# Patient Record
Sex: Male | Born: 1980 | Race: White | Hispanic: No | Marital: Single | Smoking: Never smoker
Health system: Southern US, Community
[De-identification: ages and names within clinical notes are randomized; demographics above are authoritative.]

## PROBLEM LIST (undated history)

## (undated) DIAGNOSIS — F32A Depression, unspecified: Secondary | ICD-10-CM

## (undated) DIAGNOSIS — B20 Human immunodeficiency virus [HIV] disease: Secondary | ICD-10-CM

## (undated) DIAGNOSIS — F419 Anxiety disorder, unspecified: Secondary | ICD-10-CM

## (undated) DIAGNOSIS — Z973 Presence of spectacles and contact lenses: Secondary | ICD-10-CM

## (undated) DIAGNOSIS — R85619 Unspecified abnormal cytological findings in specimens from anus: Secondary | ICD-10-CM

## (undated) DIAGNOSIS — E785 Hyperlipidemia, unspecified: Secondary | ICD-10-CM

## (undated) DIAGNOSIS — I639 Cerebral infarction, unspecified: Secondary | ICD-10-CM

## (undated) DIAGNOSIS — G4700 Insomnia, unspecified: Secondary | ICD-10-CM

## (undated) DIAGNOSIS — R011 Cardiac murmur, unspecified: Secondary | ICD-10-CM

## (undated) DIAGNOSIS — F3181 Bipolar II disorder: Secondary | ICD-10-CM

## (undated) HISTORY — DX: Depression, unspecified: F32.A

## (undated) HISTORY — DX: Cerebral infarction, unspecified: I63.9

## (undated) HISTORY — DX: Hyperlipidemia, unspecified: E78.5

## (undated) HISTORY — DX: Anxiety disorder, unspecified: F41.9

---

## 2009-02-15 DIAGNOSIS — R7989 Other specified abnormal findings of blood chemistry: Secondary | ICD-10-CM

## 2009-02-15 DIAGNOSIS — R760 Raised antibody titer: Secondary | ICD-10-CM

## 2009-02-15 HISTORY — DX: Raised antibody titer: R76.0

## 2009-02-15 HISTORY — DX: Other specified abnormal findings of blood chemistry: R79.89

## 2014-02-15 DIAGNOSIS — Z21 Asymptomatic human immunodeficiency virus [HIV] infection status: Secondary | ICD-10-CM

## 2014-02-15 HISTORY — DX: Asymptomatic human immunodeficiency virus (hiv) infection status: Z21

## 2014-04-02 DIAGNOSIS — B20 Human immunodeficiency virus [HIV] disease: Secondary | ICD-10-CM | POA: Insufficient documentation

## 2018-03-19 ENCOUNTER — Other Ambulatory Visit: Payer: Self-pay

## 2018-03-19 ENCOUNTER — Emergency Department
Admission: EM | Admit: 2018-03-19 | Discharge: 2018-03-19 | Disposition: A | Discharge status: Home / Self Care | Payer: Self-pay | Attending: Emergency Medicine | Admitting: Emergency Medicine

## 2018-03-19 ENCOUNTER — Emergency Department: Payer: Self-pay

## 2018-03-19 DIAGNOSIS — R1031 Right lower quadrant pain: Secondary | ICD-10-CM | POA: Insufficient documentation

## 2018-03-19 DIAGNOSIS — R109 Unspecified abdominal pain: Secondary | ICD-10-CM

## 2018-03-19 HISTORY — DX: Human immunodeficiency virus (HIV) disease: B20

## 2018-03-19 HISTORY — DX: Cardiac murmur, unspecified: R01.1

## 2018-03-19 LAB — COMPREHENSIVE METABOLIC PANEL
ALBUMIN: 4.2 g/dL (ref 3.5–5.0)
ALT: 16 U/L (ref 0–44)
AST: 22 U/L (ref 15–41)
Alkaline Phosphatase: 68 U/L (ref 38–126)
Anion gap: 6 (ref 5–15)
BUN: 14 mg/dL (ref 6–20)
CO2: 32 mmol/L (ref 22–32)
CREATININE: 0.99 mg/dL (ref 0.61–1.24)
Calcium: 9.5 mg/dL (ref 8.9–10.3)
Chloride: 102 mmol/L (ref 98–111)
GFR calc Af Amer: >60 mL/min (ref >60)
GFR calc non Af Amer: >60 mL/min (ref >60)
GLUCOSE: 103 mg/dL — AB (ref 70–99)
Potassium: 3.9 mmol/L (ref 3.5–5.1)
Sodium: 140 mmol/L (ref 135–145)
Total Bilirubin: 0.5 mg/dL (ref 0.3–1.2)
Total Protein: 8.2 g/dL — ABNORMAL HIGH (ref 6.5–8.1)

## 2018-03-19 LAB — URINALYSIS, COMPLETE (UACMP) WITH MICROSCOPIC
Bacteria, UA: NONE SEEN
Bilirubin Urine: NEGATIVE
Glucose, UA: NEGATIVE mg/dL
Hgb urine dipstick: NEGATIVE
Ketones, ur: NEGATIVE mg/dL
Leukocytes, UA: NEGATIVE
Nitrite: NEGATIVE
PH: 7 (ref 5.0–8.0)
Protein, ur: NEGATIVE mg/dL
Specific Gravity, Urine: 1.011 (ref 1.005–1.030)
Squamous Epithelial / HPF: NONE SEEN (ref 0–5)

## 2018-03-19 LAB — CBC
HCT: 44.3 % (ref 39.0–52.0)
Hemoglobin: 14.5 g/dL (ref 13.0–17.0)
MCH: 27.5 pg (ref 26.0–34.0)
MCHC: 32.7 g/dL (ref 30.0–36.0)
MCV: 84.1 fL (ref 80.0–100.0)
Platelets: 268 10*3/uL (ref 150–400)
RBC: 5.27 MIL/uL (ref 4.22–5.81)
RDW: 12.4 % (ref 11.5–15.5)
WBC: 7.2 10*3/uL (ref 4.0–10.5)
nRBC: 0 % (ref 0.0–0.2)

## 2018-03-19 LAB — LIPASE, BLOOD: Lipase: 32 U/L (ref 11–51)

## 2018-03-19 IMAGING — CT CT RENAL STONE PROTOCOL
3 of 4 series · 8 of 46 positions shown, 15 images · non-contrast
Comparison: None.

CLINICAL DATA: Right lower quadrant pain, nausea

EXAM:
CT ABDOMEN AND PELVIS WITHOUT CONTRAST
TECHNIQUE: Multidetector CT imaging of the abdomen and pelvis was performed
following the standard protocol without IV contrast.

[Series 4: lung bases · axial · 0.59mm/px · z∈[-35,+25]mm · 4 of 22 slices shown, 9 images]
[im 5/22  soft-tissue]
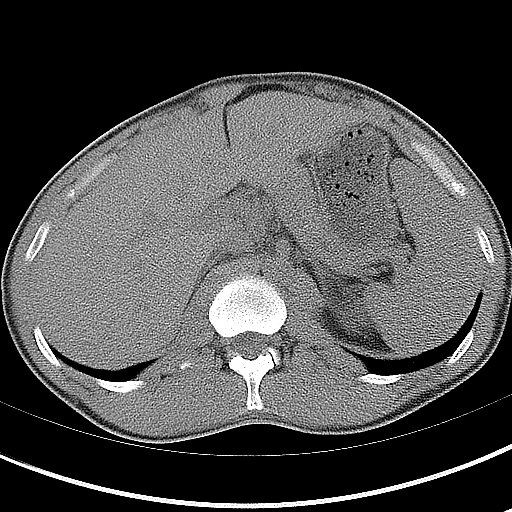
[im 5/22  lung]
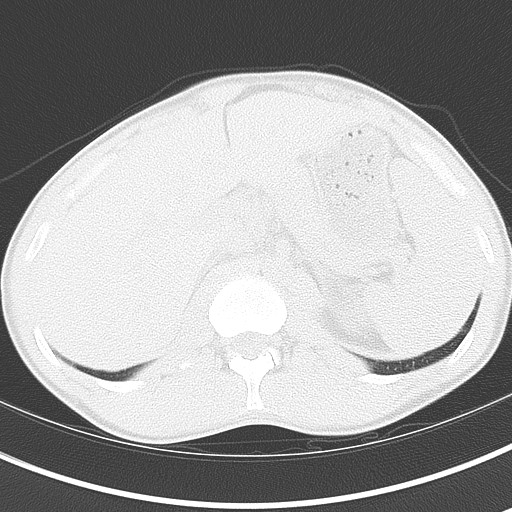
[im 5/22  bone]
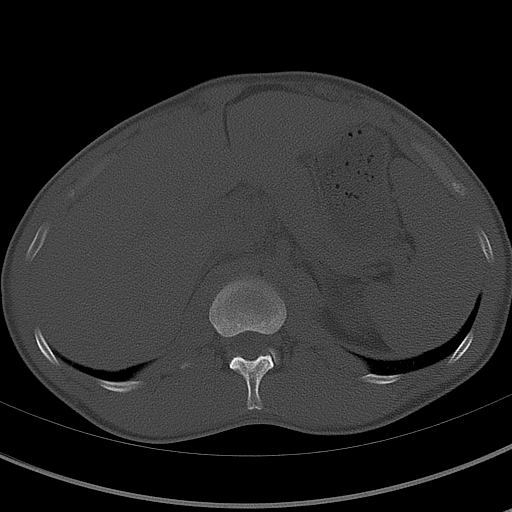
[im 9/22  soft-tissue]
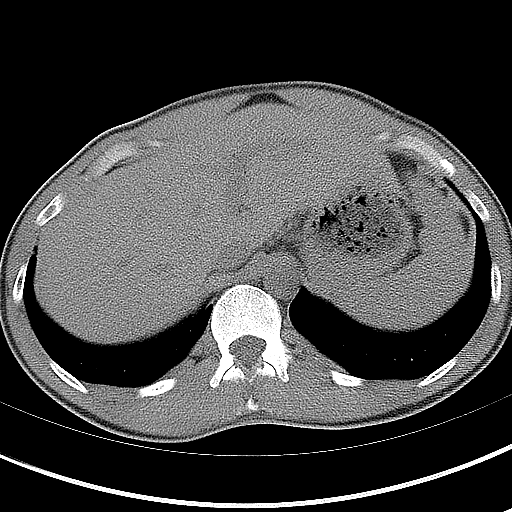
[im 9/22  lung]
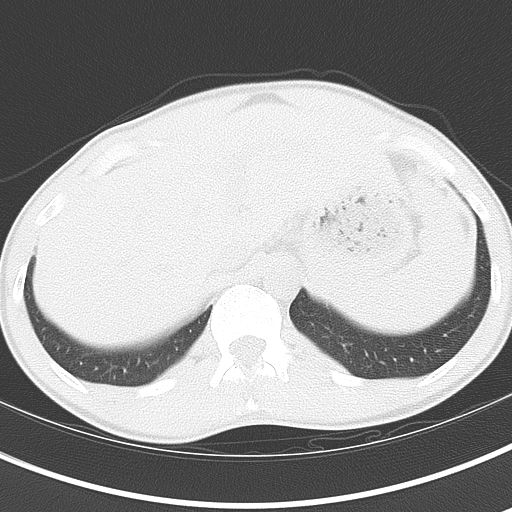
[im 13/22  soft-tissue]
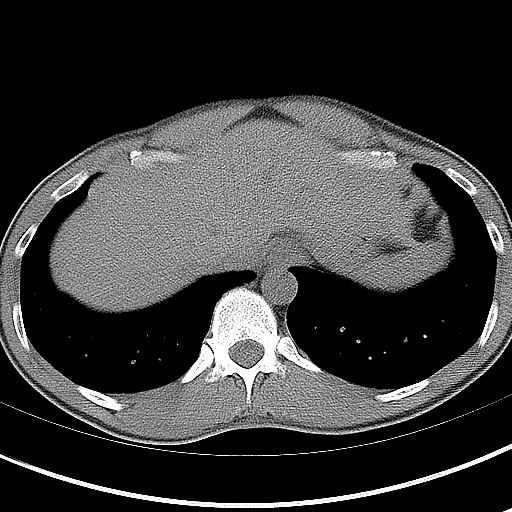
[im 13/22  lung]
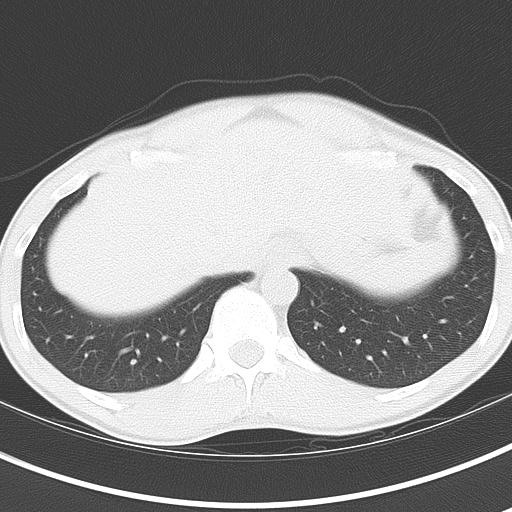
[im 17/22  soft-tissue]
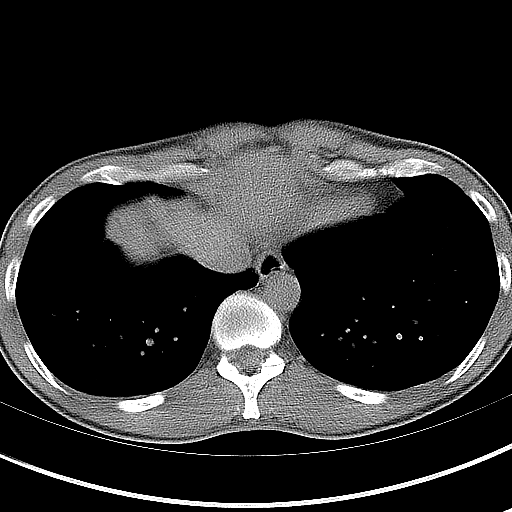
[im 17/22  lung]
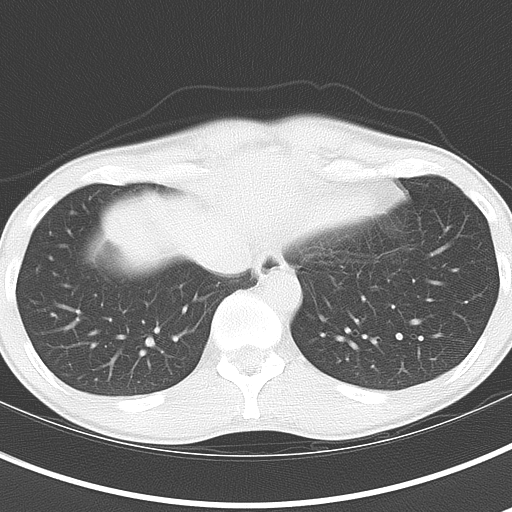

[Series 5: coronal · coronal · 0.70mm/px · 3 of 115 slices shown, 4 images]
[im 39/115  soft-tissue]
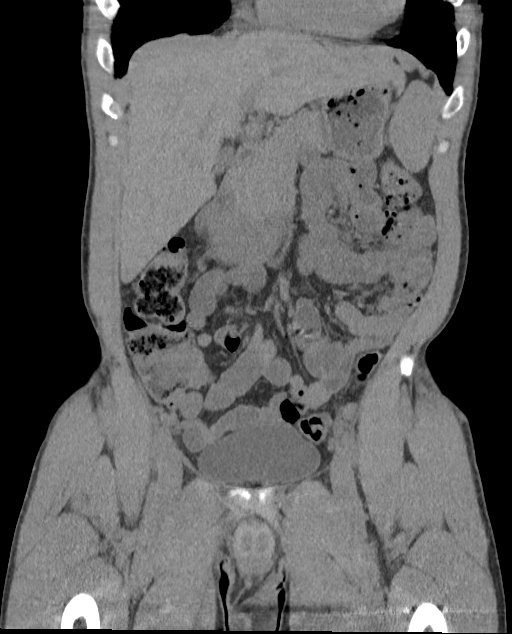
[im 51/115  soft-tissue]
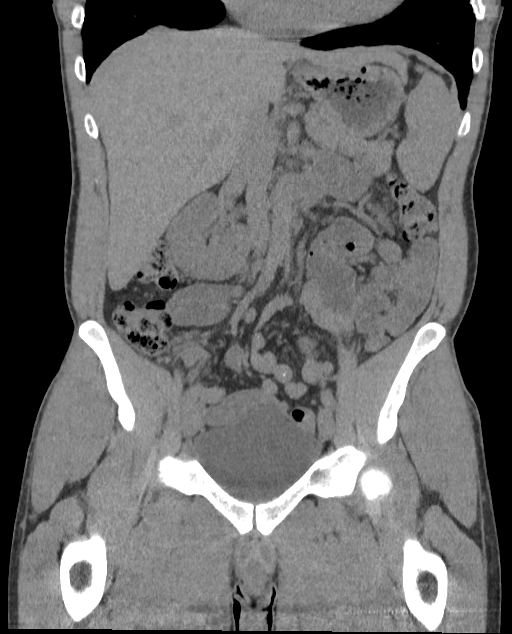
[im 51/115  bone]
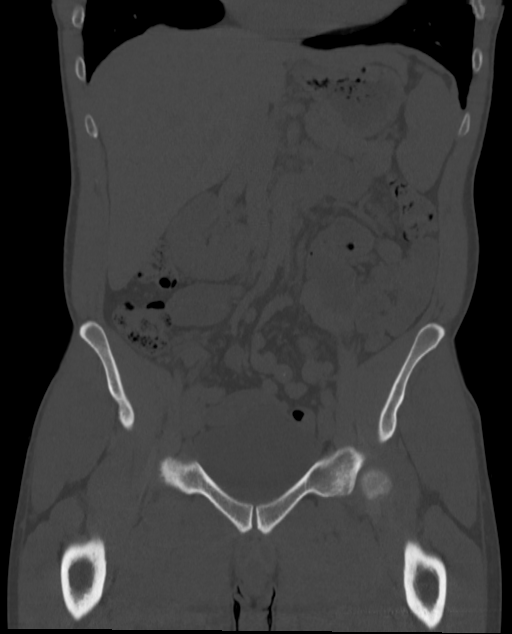
[im 64/115  soft-tissue]
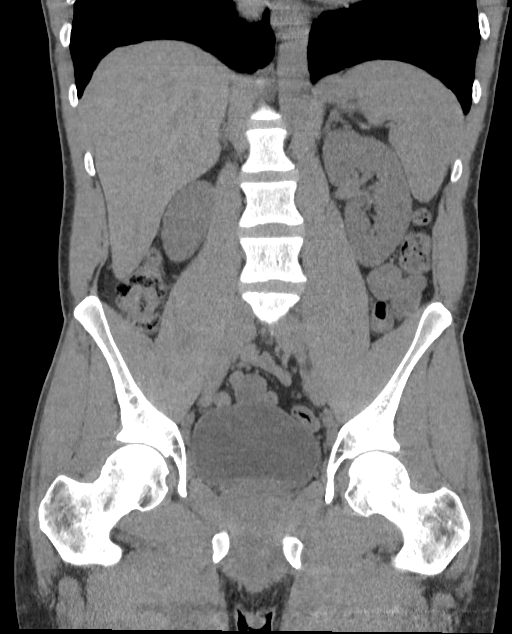

[Series 6: sagittal · sagittal · 0.49mm/px · 1 of 165 slices shown, 2 images]
[im 55/165  soft-tissue]
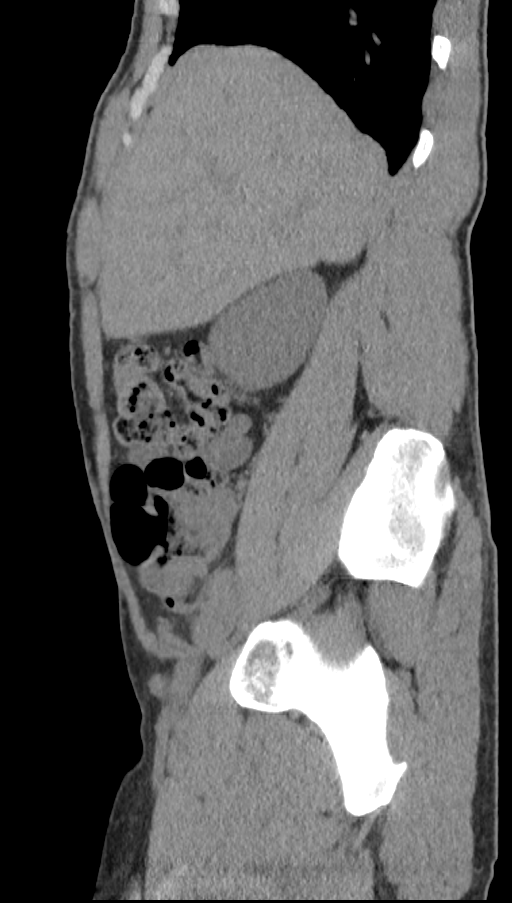
[im 55/165  bone]
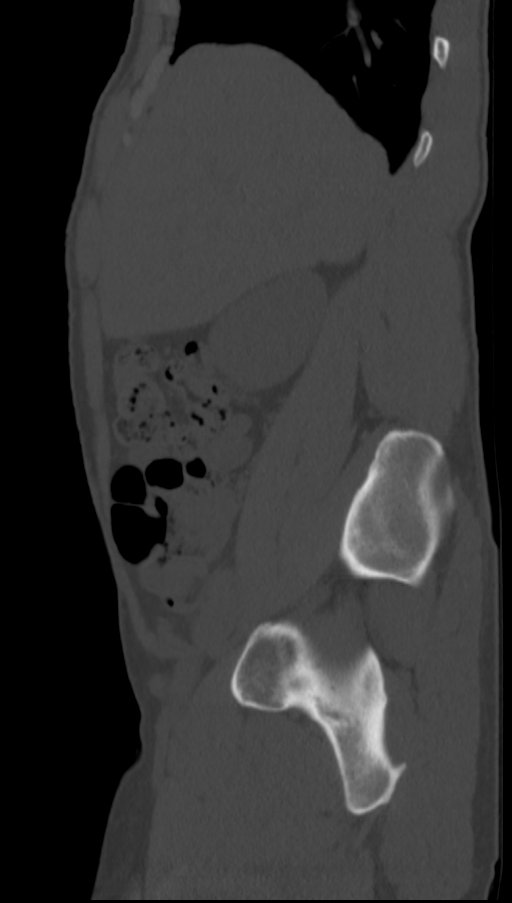

[8 of 46 positions shown; findings below may reference images not displayed]

FINDINGS: Lower chest: Lung bases are clear. No effusions. Heart is normal
size.

Hepatobiliary: Insert fat of biliary

Pancreas: No focal abnormality or ductal dilatation.

Spleen: No focal abnormality.  Normal size.

Adrenals/Urinary Tract: Punctate nonobstructing 1 mm stone in the
lower pole of the right kidney. No hydronephrosis. Adrenal glands
and urinary bladder unremarkable.

Stomach/Bowel: Normal appendix. Moderate stool burden throughout the
colon. Stomach, large and small bowel grossly unremarkable.

Vascular/Lymphatic: No evidence of aneurysm or adenopathy.

Reproductive: No visible focal abnormality.

Other: No free fluid or free air.

Musculoskeletal: No acute bony abnormality.
IMPRESSION: Punctate nonobstructing 1 mm right lower pole stone. No ureteral
stones or hydronephrosis.

Moderate stool burden.

No acute findings.

## 2018-03-19 NOTE — ED Notes (Signed)
ED Provider at bedside. 

## 2018-03-19 NOTE — ED Notes (Signed)
Patient transported to CT 

## 2018-03-19 NOTE — ED Provider Notes (Signed)
[facility] Emergency Department Provider Note  ___________________________________________   First MD Initiated Contact with Patient 03/19/18 1859     (approximate)  I have reviewed the triage vital signs and the nursing notes.   HISTORY  Chief Complaint Abdominal Pain   HPI Lucia Bitterhomas Ange is a 37 y.o. male with a history of HIV not on antiretroviral therapy over the past year was presented to the emergency department with right flank and right abdominal pain.  He says that the pain is been there for the last 2 days and is a 4 out of 10 pressure-like pain.  The patient denies any heavy lifting or injury or exertional activity that would have caused this.  Denies any nausea vomiting or diarrhea.  Denies any history of kidney stones.  No pain with urination.  No blood in his urine.  Pain does not worsen with movement.  Patient denies any radiation of the pain to the penis of the testicles.   Past Medical History:  Diagnosis Date  . Heart murmur   . HIV (human immunodeficiency virus infection) (HCC)     There are no active problems to display for this patient.   History reviewed. No pertinent surgical history.  Prior to Admission medications   Not on File    Allergies Patient has no known allergies.  No family history on file.  Social History Social History   Tobacco Use  . Smoking status: Never Smoker  Substance Use Topics  . Alcohol use: Not Currently  . Drug use: Not on file    Review of Systems  Constitutional: No fever/chills Eyes: No visual changes. ENT: No sore throat. Cardiovascular: Denies chest pain. Respiratory: Denies shortness of breath. Gastrointestinal: No nausea, no vomiting.  No diarrhea.  No constipation. Genitourinary: Negative for dysuria. Musculoskeletal: Negative for back pain. Skin: Negative for rash. Neurological: Negative for headaches, focal weakness or  numbness.   ____________________________________________   PHYSICAL EXAM:  VITAL SIGNS: ED Triage Vitals [03/19/18 1705]  Enc Vitals Group     BP 132/80     Pulse Rate 80     Resp 18     Temp 98.1 F (36.7 C)     Temp Source Oral     SpO2 99 %     Weight 145 lb (65.8 kg)     Height 5\' 8"  (1.727 m)     Head Circumference      Peak Flow      Pain Score 4     Pain Loc      Pain Edu?      Excl. in GC?     Constitutional: Alert and oriented. Well appearing and in no acute distress. Eyes: Conjunctivae are normal.  Head: Atraumatic. Nose: No congestion/rhinnorhea. Mouth/Throat: Mucous membranes are moist.  Neck: No stridor.   Cardiovascular: Normal rate, regular rhythm. Grossly normal heart sounds.  Respiratory: Normal respiratory effort.  No retractions. Lungs CTAB. Gastrointestinal: Soft with mild right flank tenderness to palpation.  No right lower quadrant tenderness.  Negative Murphy sign.  No distention. No CVA tenderness. Musculoskeletal: No lower extremity tenderness nor edema.  No joint effusions. Neurologic:  Normal speech and language. No gross focal neurologic deficits are appreciated. Skin:  Skin is warm, dry and intact. No rash noted. Psychiatric: Mood and affect are normal. Speech and behavior are normal.  ____________________________________________   LABS (all labs ordered are listed, but only abnormal results are displayed)  Labs Reviewed  COMPREHENSIVE METABOLIC PANEL - Abnormal;  Notable for the following components:      Result Value   Glucose, Bld 103 (*)    Total Protein 8.2 (*)    All other components within normal limits  URINALYSIS, COMPLETE (UACMP) WITH MICROSCOPIC - Abnormal; Notable for the following components:   Color, Urine STRAW (*)    APPearance CLEAR (*)    All other components within normal limits  LIPASE, BLOOD  CBC    ____________________________________________  EKG   ____________________________________________  RADIOLOGY  Punctate nonobstructing 1 mm right pleural pole stone.  No ureteral stone or hydronephrosis.  Moderate stool burden.  No other acute findings.  Normal appendix visualized. ____________________________________________   PROCEDURES  Procedure(s) performed:   Procedures  Critical Care performed:   ____________________________________________   INITIAL IMPRESSION / ASSESSMENT AND PLAN / ED COURSE  Pertinent labs & imaging results that were available during my care of the patient were reviewed by me and considered in my medical decision making (see chart for details).  Differential diagnosis includes, but is not limited to, acute appendicitis, renal colic, testicular torsion, urinary tract infection/pyelonephritis, prostatitis,  epididymitis, diverticulitis, small bowel obstruction or ileus, colitis, abdominal aortic aneurysm, gastroenteritis, hernia, etc. As part of my medical decision making, I reviewed the following data within the electronic MEDICAL RECORD NUMBER Notes from prior ED visits  ----------------------------------------- 8:05 PM on 03/19/2018 -----------------------------------------  Possibly pain related to small lower pole stone starting to make its way down the ureter.  However, most likely unrelated.  Patient also states that he is not having any constipation and is moving his bowels per his baseline.  To give follow-up with primary care as well as infectious disease so the patient may be represcribed antiretroviral therapy.  He is understanding the diagnosis as well as treatment and willing to comply. ____________________________________________   FINAL CLINICAL IMPRESSION(S) / ED DIAGNOSES  Right flank pain  NEW MEDICATIONS STARTED DURING THIS VISIT:  New Prescriptions   No medications on file     Note:  This document was prepared using Dragon  voice recognition software and may include unintentional dictation errors.     Myrna Blazer, MD 03/19/18 2005

## 2018-03-19 NOTE — ED Triage Notes (Signed)
RLQ pain over past 2 days, progressing. Nausea. Denies emesis or diarrhea. Pt alert and oriented X4, active, cooperative, pt in NAD. RR even and unlabored, color WNL.

## 2018-03-25 ENCOUNTER — Telehealth: Payer: Self-pay | Admitting: Licensed Clinical Social Worker

## 2018-03-25 NOTE — Telephone Encounter (Signed)
Referral was sent from [facility] ED for patient to be seen in ID and started on antiretrovirals. Left message for the patient to contact me on voicemail.

## 2018-05-07 ENCOUNTER — Other Ambulatory Visit
Admission: RE | Admit: 2018-05-07 | Discharge: 2018-05-07 | Disposition: A | Discharge status: Home / Self Care | Payer: PRIVATE HEALTH INSURANCE | Source: Ambulatory Visit | Attending: Infectious Diseases | Admitting: Infectious Diseases

## 2018-05-07 ENCOUNTER — Encounter: Payer: Self-pay | Admitting: Infectious Diseases

## 2018-05-07 ENCOUNTER — Ambulatory Visit: Payer: PRIVATE HEALTH INSURANCE | Attending: Infectious Diseases | Admitting: Infectious Diseases

## 2018-05-07 VITALS — BP 123/71 | HR 80 | Temp 97.4°F

## 2018-05-07 DIAGNOSIS — B20 Human immunodeficiency virus [HIV] disease: Secondary | ICD-10-CM | POA: Insufficient documentation

## 2018-05-07 DIAGNOSIS — Z21 Asymptomatic human immunodeficiency virus [HIV] infection status: Secondary | ICD-10-CM

## 2018-05-07 LAB — CHLAMYDIA/NGC RT PCR (ARMC ONLY)
Chlamydia Tr: NOT DETECTED
N gonorrhoeae: NOT DETECTED

## 2018-05-07 MED ORDER — BICTEGRAVIR-EMTRICITAB-TENOFOV 50-200-25 MG PO TABS: 1.0000 | ORAL_TABLET | Freq: Every day | ORAL | 1 refills | Status: DC

## 2018-05-07 NOTE — Progress Notes (Signed)
NAME: Darrell Kennedy  DOB: 12/05/1980  MRN: [id number]  Date/Time: 05/07/2018 12:06 PM Subjective:  REASON FOR CONSULT: here to engage in care ? Darrell Kennedy is a 38 y.o.male  with a history HIV diagnosed NoV 2015 in [place] when he had flu like symptoms and knew he had acute HIV iand went and got tested. Nadir Cd4 was > 700  He saw Dr.PAul Katrinka Blazing and after labs done he was started on isentress and truvada. He later changed to [facility]. Since end of 2017 he has been off treatment because of lack of insurance. He moved to [facility] in 2018 and did not engage in care because of lack on insurance. He went to the Ed with abdominal pain on 12/3 and was told it could be due to renal stone. He was asked to follow up with ID for HIV care. Pt has insurance now Insurance claims handler [facility])  Doing well No fver , chills, weight loss, cough, sob, diarrhea, fatigue Not sexually active in nearly 2 years   He teaches dance at  [employer] Nadir Cd4 744 VL >200,000 OI none HAARt history truvada isentress Genvoya Acquired thru-sex Genotype ? Past Medical History:  Diagnosis Date  . Heart murmur   . HIV (human immunodeficiency virus infection) (HCC)    ear infection  FH Father- diabetes, HTN Mother-adrenal tumor removed ?SH Non smoker No illicit drug use Occasional alcohol Has traveled to [place] in 2013 Lives with his parents Teaches at [employer]  REVIEW OF SYSTEMS:  Const: negative fever, negative chills, negative weight loss Eyes: negative diplopia or visual changes, negative eye pain ENT: negative coryza, negative sore throat Resp: negative cough, hemoptysis, dyspnea Cards: negative for chest pain, palpitations, lower extremity edema GU: negative for frequency, dysuria and hematuria Skin: negative for rash and pruritus Heme: negative for easy bruising and gum/nose bleeding MS: negative for myalgias, arthralgias, back pain and muscle weakness Neurolo:negative for headaches, dizziness, vertigo, memory problems   Psych: negative for feelings of anxiety, depression   Objective:  VITALS:  BP 123/71 (BP Location: Left Arm, Patient Position: Sitting, Cuff Size: Normal)   Pulse 80   Temp (!) 97.4 F (36.3 C) (Oral)  PHYSICAL EXAM:  General: Alert, cooperative, no distress, appears stated age.  Head: Normocephalic, without obvious abnormality, atraumatic. Eyes: Conjunctivae clear, anicteric sclerae. Pupils are equal Nose: Nares normal. No drainage or sinus tenderness. Throat: Lips, mucosa, and tongue normal. No Thrush Neck: Supple, symmetrical, no adenopathy, thyroid: non tender no carotid bruit and no JVD. Back: No CVA tenderness. Lungs: Clear to auscultation bilaterally. No Wheezing or Rhonchi. No rales. Heart: Regular rate and rhythm, no murmur, rub or gallop. Abdomen: Soft, non-tender,not distended. Bowel sounds normal. No masses Extremities: Extremities normal, atraumatic, no cyanosis. No edema. No clubbing Skin: No rashes or lesions. Not Jaundiced Lymph: Cervical, supraclavicular normal. Neurologic: Grossly non-focal Pertinent Labs None currently Health maintenance  Vaccination pneumovac- 23- Prevnar-13 on 04/02/14 HepB/HEPA twinrix-05/15/14, 06/15/14 & 11/24/14 HepA TdaP-05/15/14 Flu Herpes zoster- HPV ______________________ labs RPR HEPC ab Lipid CMv TOXO -IGG/IgM HIV VL Cd4 quantiferon Gold GC/CHL [id number] on 04/02/14 Genotype-A98G resistance to viramune possible on 04/02/14 HIV antibody  Preventive  Dental Colonoscopy Opthal Anal PAP  Impression/Recommendation ? ?HIV- here to engage in care has been off meds for 2 years now- will do labs today- will start Biktarvy Side effects explained   Health maintenance to be updated ? Follow up 3 months When pt was in the visit his medical records from his previous provider was not  available- it was faxed to [place] after he left.

## 2018-05-07 NOTE — Patient Instructions (Signed)
You are here to engage in HIV care. Today we will do labs and start Biktarvy.

## 2018-05-08 LAB — HEPATITIS PANEL, ACUTE
Hep A IgM: NEGATIVE
Hep B C IgM: NEGATIVE
Hepatitis B Surface Ag: NEGATIVE

## 2018-05-08 LAB — HIV-1 RNA QUANT-NO REFLEX-BLD
HIV 1 RNA Quant: 15300 copies/mL
LOG10 HIV-1 RNA: 4.185 log10copy/mL

## 2018-05-08 LAB — T-HELPER CELLS CD4/CD8 %
% CD 4 Pos. Lymph.: 18.3 % — ABNORMAL LOW (ref 30.8–58.5)
ABSOLUTE CD 4 HELPER: 458 /uL (ref 359–1519)
Basophils Absolute: 0.1 10*3/uL (ref 0.0–0.2)
Basos: 1 %
CD3+CD4+ Cells/CD3+CD8+ Cells Bld: 0.3 — ABNORMAL LOW (ref 0.92–3.72)
CD3+CD8+ Cells # Bld: 1543 /uL — ABNORMAL HIGH (ref 109–897)
CD3+CD8+ Cells NFr Bld: 61.7 % — ABNORMAL HIGH (ref 12.0–35.5)
EOS (ABSOLUTE): 0.2 10*3/uL (ref 0.0–0.4)
Eos: 4 %
Hematocrit: 42.2 % (ref 37.5–51.0)
Hemoglobin: 14.2 g/dL (ref 13.0–17.7)
IMMATURE GRANS (ABS): 0 10*3/uL (ref 0.0–0.1)
Immature Granulocytes: 0 %
LYMPHS: 50 %
Lymphocytes Absolute: 2.5 10*3/uL (ref 0.7–3.1)
MCH: 27.6 pg (ref 26.6–33.0)
MCHC: 33.6 g/dL (ref 31.5–35.7)
MCV: 82 fL (ref 79–97)
Monocytes Absolute: 0.5 10*3/uL (ref 0.1–0.9)
Monocytes: 11 %
Neutrophils Absolute: 1.6 10*3/uL (ref 1.4–7.0)
Neutrophils: 34 %
Platelets: 284 10*3/uL (ref 150–450)
RBC: 5.14 x10E6/uL (ref 4.14–5.80)
RDW: 13.2 % (ref 11.6–15.4)
WBC: 4.9 10*3/uL (ref 3.4–10.8)

## 2018-05-08 LAB — RPR, QUANT+TP ABS (REFLEX)
Rapid Plasma Reagin, Quant: 1:1 {titer} — ABNORMAL HIGH
T Pallidum Abs: NONREACTIVE

## 2018-05-08 LAB — HIV 1/2 AB DIFFERENTIATION
HIV 1 AB: POSITIVE — AB
HIV 2 Ab: NEGATIVE

## 2018-05-08 LAB — HIV ANTIBODY (ROUTINE TESTING W REFLEX): HIV Screen 4th Generation wRfx: REACTIVE — AB

## 2018-05-08 LAB — RPR: RPR Ser Ql: REACTIVE — AB

## 2018-05-08 LAB — HEPATITIS B SURFACE ANTIBODY, QUANTITATIVE: Hep B S AB Quant (Post): 27.6 m[IU]/mL (ref >9.9)

## 2018-05-10 LAB — QUANTIFERON-TB GOLD PLUS: QUANTIFERON-TB GOLD PLUS: NEGATIVE

## 2018-05-10 LAB — QUANTIFERON-TB GOLD PLUS (RQFGPL)
QUANTIFERON NIL VALUE: 0.11 [IU]/mL
QuantiFERON Mitogen Value: >10 IU/mL
QuantiFERON TB1 Ag Value: 0.06 IU/mL
QuantiFERON TB2 Ag Value: 0.09 IU/mL

## 2018-05-20 ENCOUNTER — Telehealth: Payer: Self-pay | Admitting: Licensed Clinical Social Worker

## 2018-05-20 LAB — GENOSURE PRIME (GSPRIL)

## 2018-05-20 NOTE — Telephone Encounter (Signed)
      Kirkwood Navas Male, 38 y.o., [birth year] MRN:  [id number] Phone:  [phone] Judie Petit) PCP:  Patient, No Pcp Per Primary Cvg:  [facility] [place] [place] Message  Received: Today  Message Contents  Lynn Ito, MD  Starleen Arms D, CMA        Can you please check with [facility] to see why they could not do the test and why it needs to be repeated.Thx   Previous Messages        Joseph Art  Order: [id number]  Status:  Edited Result - FINAL Visible to patient:  No (Not Released)  Component 13d ago  HIV GenoSure PRIme(SM) PDF QNSRP VC  Comment: (NOTE)  Repeat analysis of this specimen is required to establish valid  results. However, the quantity of specimen remaining is  insufficient to repeat.    Yeni R notified 05/20/2018   Resulting Agency [facility]      Specimen Collected: 05/07/18 13:00 Last Resulted: 05/20/18 12:36      Lab Flowsheet     Order Details     View Encounter     Lab and Collection Details     Routing     Result History      VC=Value has a corrected status       Pruitt Zaretsky  (MR# [id number])  Status of Other Orders   Expected   Chlamydia/GC NAA, Confirmation 05/07/18    Completed    QuantiFERON-TB Gold Plus  05/10/18  QuantiFERON-TB Gold Plus  05/10/18  RPR, quant & T.pallidum antibodies AbnormalAbnormal   05/08/18  HIV 1/2 Ab Differentiation AbnormalAbnormal   05/08/18  HIV antibody (with reflex) AbnormalAbnormal   05/08/18  HIV 1 RNA quant-no reflex-bld  Final-Edited, 05/08/18  T-helper cells CD4/CD8 % AbnormalAbnormal   05/08/18  RPR AbnormalAbnormal   05/08/18  Hepatitis, Acute  05/08/18  Hepatitis B surface antibody,quantitative  05/08/18  Chlamydia/NGC rt PCR ([facility] only)  05/07/18    Canceled   GenoSure Prime 05/08/18  Reason: Patient Discharge  GenoSure PRIme(SM) EDI 05/08/18  Reason: Patient Discharge  GenoSure PRIme(SM) EDI 05/08/18  Reason: Patient  Discharge  GenoSure PRIme(SM) EDI 05/07/18   I called [facility] about the above test not resulting and it is due to the lab not collecting 2 lavender tubes of plasma. They only sent in 1 plasma for the test. The sample needed to be retested and it wasn't enough plasma. [facility] states that 4 tubes of whole blood needs to be collected, then spun down and poured in two tubes and then frozen.

## 2018-06-11 ENCOUNTER — Telehealth: Payer: Self-pay | Admitting: Infectious Diseases

## 2018-06-11 NOTE — Telephone Encounter (Signed)
Patient called stating he needed a refill on his prescription soon, and he has other questions - did not state about.  [phone]

## 2018-07-01 ENCOUNTER — Other Ambulatory Visit: Payer: Self-pay | Admitting: Licensed Clinical Social Worker

## 2018-07-01 DIAGNOSIS — B20 Human immunodeficiency virus [HIV] disease: Secondary | ICD-10-CM

## 2018-07-01 MED ORDER — BICTEGRAVIR-EMTRICITAB-TENOFOV 50-200-25 MG PO TABS: 1.0000 | ORAL_TABLET | Freq: Every day | ORAL | 1 refills | Status: DC

## 2018-07-18 ENCOUNTER — Ambulatory Visit: Payer: PRIVATE HEALTH INSURANCE | Admitting: Infectious Diseases

## 2018-09-03 ENCOUNTER — Other Ambulatory Visit: Payer: Self-pay | Admitting: Licensed Clinical Social Worker

## 2018-09-03 DIAGNOSIS — B20 Human immunodeficiency virus [HIV] disease: Secondary | ICD-10-CM

## 2018-09-03 MED ORDER — BICTEGRAVIR-EMTRICITAB-TENOFOV 50-200-25 MG PO TABS: 1.0000 | ORAL_TABLET | Freq: Every day | ORAL | 1 refills | Status: DC

## 2018-09-10 ENCOUNTER — Ambulatory Visit: Payer: PRIVATE HEALTH INSURANCE | Attending: Infectious Diseases | Admitting: Infectious Diseases

## 2018-09-10 ENCOUNTER — Other Ambulatory Visit
Admission: RE | Admit: 2018-09-10 | Discharge: 2018-09-10 | Disposition: A | Discharge status: Home / Self Care | Payer: PRIVATE HEALTH INSURANCE | Source: Ambulatory Visit | Attending: Infectious Diseases | Admitting: Infectious Diseases

## 2018-09-10 ENCOUNTER — Encounter: Payer: Self-pay | Admitting: Infectious Diseases

## 2018-09-10 ENCOUNTER — Other Ambulatory Visit: Payer: Self-pay

## 2018-09-10 VITALS — BP 128/77 | HR 75 | Temp 97.7°F | Ht 68.0 in | Wt 160.0 lb

## 2018-09-10 DIAGNOSIS — B20 Human immunodeficiency virus [HIV] disease: Secondary | ICD-10-CM | POA: Diagnosis not present

## 2018-09-10 DIAGNOSIS — Z79899 Other long term (current) drug therapy: Secondary | ICD-10-CM

## 2018-09-10 DIAGNOSIS — Z21 Asymptomatic human immunodeficiency virus [HIV] infection status: Secondary | ICD-10-CM | POA: Diagnosis not present

## 2018-09-10 DIAGNOSIS — Z23 Encounter for immunization: Secondary | ICD-10-CM

## 2018-09-10 LAB — COMPREHENSIVE METABOLIC PANEL
ALT: 15 U/L (ref 0–44)
AST: 26 U/L (ref 15–41)
Albumin: 4.3 g/dL (ref 3.5–5.0)
Alkaline Phosphatase: 62 U/L (ref 38–126)
Anion gap: 7 (ref 5–15)
BUN: 16 mg/dL (ref 6–20)
CO2: 29 mmol/L (ref 22–32)
Calcium: 9.3 mg/dL (ref 8.9–10.3)
Chloride: 103 mmol/L (ref 98–111)
Creatinine, Ser: 1.34 mg/dL — ABNORMAL HIGH (ref 0.61–1.24)
GFR calc Af Amer: >60 mL/min (ref >60)
GFR calc non Af Amer: >60 mL/min (ref >60)
Glucose, Bld: 97 mg/dL (ref 70–99)
Potassium: 4.1 mmol/L (ref 3.5–5.1)
Sodium: 139 mmol/L (ref 135–145)
Total Bilirubin: 0.6 mg/dL (ref 0.3–1.2)
Total Protein: 7.7 g/dL (ref 6.5–8.1)

## 2018-09-10 LAB — LIPID PANEL
Cholesterol: 228 mg/dL — ABNORMAL HIGH (ref 0–200)
HDL: 46 mg/dL (ref >40)
LDL Cholesterol: 158 mg/dL — ABNORMAL HIGH (ref 0–99)
Total CHOL/HDL Ratio: 5 RATIO
Triglycerides: 122 mg/dL (ref <150)
VLDL: 24 mg/dL (ref 0–40)

## 2018-09-10 NOTE — Progress Notes (Signed)
NAME: Darrell Kennedy  DOB: 11/06/1980  MRN: [id number]  Date/Time: 09/10/2018 10:54 AM  Subjective:  Follow up  ? Darrell Kennedy is a 38 y.o. with a history of HIV Is here for follow up. Last seen in Jan 2020 Doing well, on Biktarvy, 100% adherent - no side effects from the medication.    HIV diagnosed NOV 2015 in  [place] when he had flu like symptoms and knew he had acute HIV and went and got tested. Nadir Cd4 was > 700  He saw Dr.PAul Tamala Julian and was started on isentress and truvada. He later changed to [facility]. Since end of 2017 he was off treatment because of lack of insurance. He moved to [facility] in 2018 and did not engage in care because of lack on insurance. Pt has insurance now [employer])  Nadir Cd4 744 VL >200,000 OI none HAARt history truvada isentress Genvoya Acquired thru-sex Genotype done in [place] ? Past Medical History:  Diagnosis Date  . Heart murmur   . HIV (human immunodeficiency virus infection) ([place])     Surgical History -none  FH Father- diabetes, HTN Mother-adrenal tumor removed  ?SH Non smoker No illicit drug use Occasional alcohol Has traveled to [place] in 2013 Lives with his parents Teaches at [employer]  ? Current Outpatient Medications  Medication Sig Dispense Refill  . bictegravir-emtricitabine-tenofovir AF (BIKTARVY) 50-200-25 MG TABS tablet Take 1 tablet by mouth daily. 30 tablet 1   No current facility-administered medications for this visit.     REVIEW OF SYSTEMS:  Const: negative fever, negative chills, negative weight loss Eyes: negative diplopia or visual changes, negative eye pain ENT: negative coryza, negative sore throat Resp: negative cough, hemoptysis, dyspnea Cards: negative for chest pain, palpitations, lower extremity edema GU: negative for frequency, dysuria and hematuria Skin: negative for rash and pruritus Heme: negative for easy bruising and gum/nose bleeding MS: negative for myalgias, arthralgias, back pain and muscle  weakness Neurolo:negative for headaches, dizziness, vertigo, memory problems  Psych: negative for feelings of anxiety, depression   Objective:  VITALS:  BP 128/77 (BP Location: Left Arm, Patient Position: Sitting, Cuff Size: Normal)   Pulse 75   Temp 97.7 F (36.5 C) (Oral)   Ht 5' 8" (1.727 m)   Wt 160 lb (72.6 kg)   BMI 24.33 kg/m  PHYSICAL EXAM:  General: Alert, cooperative, no distress, appears stated age.  Head: Normocephalic, without obvious abnormality, atraumatic. Eyes: Conjunctivae clear, anicteric sclerae. Pupils are equal Nose: Nares normal. No drainage or sinus tenderness. Throat: Lips, mucosa, and tongue normal. No Thrush Neck: Supple, symmetrical, no adenopathy, thyroid: non tender no carotid bruit and no JVD. Back: No CVA tenderness. Lungs: Clear to auscultation bilaterally. No Wheezing or Rhonchi. No rales. Heart: Regular rate and rhythm, no murmur, rub or gallop. Abdomen: Soft, non-tender,not distended. Bowel sounds normal. No masses Extremities: Extremities normal, atraumatic, no cyanosis. No edema. No clubbing Skin: No rashes or lesions. Not Jaundiced Lymph: Cervical, supraclavicular normal. Neurologic: Grossly non-focal Pertinent Labs  IMAGING RESULTS: Health maintenance Vaccination  Vaccine Date last given comment  Influenza    Hepatitis B 05/15/2014,2/29 &11/24/14 [place]  Hepatitis A 05/15/14 &06/15/14   Prevnar-PCV-13 04/02/2014   Pneumovac-PPSV-23    TdaP 05/15/14   HPV    Shingrix ( zoster vaccine)     ______________________  Labs Lab Result  Date comment  HIV VL 15,300 05/07/18   CD4 458(18.3%) 05/07/18   Genotype     [id number] NEG    HIV antibody Reactive  05/07/18   RPR 1:1 ( but TAP NEG 05/07/18 False positive  Quantiferon Gold NR 05/07/18   Hep C ab     Hepatitis B-ab,ag,c Ab-positive 05/07/18 vaccinated  Hepatitis A-IgM, IgG /T     Lipid     GC/CHL     PAP     HB,PLT,Cr, LFT 14/284/1.34/N      Preventive  Procedure Result  Date comment   colonoscopy     Mammogram     Dental exam     Opthal       Impression/Recommendation ? HIV- on Biktarvy- doing well- 100% adherent . Will do labs today  Health maintenance updated He will get Pneumovac 23 vaccination today  Will do anal pap when we have the kit.  Follow up 6 months ___________________________________________________ Discussed with patient

## 2018-09-10 NOTE — Patient Instructions (Addendum)
You are here for follow up- today we will do labs- will follow up in 6 months. Continue Biktarvy 1 tablet once a day. Today you will receive pneumococcal Vaccine 23.

## 2018-09-11 LAB — T-HELPER CELLS CD4/CD8 %
% CD 4 Pos. Lymph.: 22.5 % — ABNORMAL LOW (ref 30.8–58.5)
Absolute CD 4 Helper: 563 /uL (ref 359–1519)
Basophils Absolute: 0.1 10*3/uL (ref 0.0–0.2)
Basos: 1 %
CD3+CD4+ Cells/CD3+CD8+ Cells Bld: 0.41 — ABNORMAL LOW (ref 0.92–3.72)
CD3+CD8+ Cells # Bld: 1370 /uL — ABNORMAL HIGH (ref 109–897)
CD3+CD8+ Cells NFr Bld: 54.8 % — ABNORMAL HIGH (ref 12.0–35.5)
EOS (ABSOLUTE): 0.3 10*3/uL (ref 0.0–0.4)
Eos: 5 %
Hematocrit: 43.3 % (ref 37.5–51.0)
Hemoglobin: 14.7 g/dL (ref 13.0–17.7)
Immature Grans (Abs): 0 10*3/uL (ref 0.0–0.1)
Immature Granulocytes: 0 %
Lymphocytes Absolute: 2.5 10*3/uL (ref 0.7–3.1)
Lymphs: 46 %
MCH: 28.5 pg (ref 26.6–33.0)
MCHC: 33.9 g/dL (ref 31.5–35.7)
MCV: 84 fL (ref 79–97)
Monocytes Absolute: 0.4 10*3/uL (ref 0.1–0.9)
Monocytes: 8 %
Neutrophils Absolute: 2.1 10*3/uL (ref 1.4–7.0)
Neutrophils: 40 %
Platelets: 284 10*3/uL (ref 150–450)
RBC: 5.15 x10E6/uL (ref 4.14–5.80)
RDW: 13.3 % (ref 11.6–15.4)
WBC: 5.3 10*3/uL (ref 3.4–10.8)

## 2018-09-17 LAB — HIV-1 RNA QUANT-NO REFLEX-BLD
HIV 1 RNA Quant: 50 copies/mL
LOG10 HIV-1 RNA: 1.699 log10copy/mL

## 2018-10-07 ENCOUNTER — Ambulatory Visit (INDEPENDENT_AMBULATORY_CARE_PROVIDER_SITE_OTHER): Payer: PRIVATE HEALTH INSURANCE | Admitting: Family Medicine

## 2018-10-07 ENCOUNTER — Encounter: Payer: Self-pay | Admitting: Family Medicine

## 2018-10-07 DIAGNOSIS — Z21 Asymptomatic human immunodeficiency virus [HIV] infection status: Secondary | ICD-10-CM

## 2018-10-07 DIAGNOSIS — B20 Human immunodeficiency virus [HIV] disease: Secondary | ICD-10-CM | POA: Insufficient documentation

## 2018-10-07 DIAGNOSIS — F419 Anxiety disorder, unspecified: Secondary | ICD-10-CM | POA: Diagnosis not present

## 2018-10-07 DIAGNOSIS — Z7689 Persons encountering health services in other specified circumstances: Secondary | ICD-10-CM

## 2018-10-07 NOTE — Progress Notes (Signed)
Name: Darrell Kennedy   MRN: [id number]    DOB: 08/23/1980   Date:10/07/2018       Progress Note  Subjective  Chief Complaint  Chief Complaint  Patient presents with  . New Patient (Initial Visit)  . Referral    counseling/therapist    I connected with  Darrell Kennedy  on 10/07/18 at 11:00 AM EDT by a video enabled telemedicine application and verified that I am speaking with the correct person using two identifiers.  I discussed the limitations of evaluation and management by telemedicine and the availability of in person appointments. The patient expressed understanding and agreed to proceed. Staff also discussed with the patient that there may be a patient responsible charge related to this service. Patient Location: Home Provider Location: Home Additional Individuals present: None  HPI  Pt presents to establish care.  Anxiety: He has a lot of down time lately due to COVID-19 and has been focusing on himself lately.  He had a conversation with his father recently about his father's depression, and his father encouraged the patient to get help with any depressive or anxiety symptoms that the patient may be having.  He does practice meditation.  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  He moved back to [place] in 2018.   HIV: Diagnosed in 2015. Seeing Dr. Rivka Saferavishankar; has been compliant with medications.  Has never had any complications or complex infections. Taking triple therapy.   Elevated LDL: He had elevated LDL at 158 on Sep 10 2018, but notes had had unhealthy diet prior to this.     [facility]  PHQ-9 Total Score  2      Patient Active Problem List   Diagnosis Date Noted  . HIV infection (HCC) 10/07/2018   No past surgical history on file.  Family History  Problem Relation Age of Onset  . Hypertension Father   . Diabetes Father   . Kidney disease  Father   . Depression Father   . Hyperlipidemia Father   . Heart disease Paternal Grandmother     Social History   Socioeconomic History  . Marital status: Single    Spouse name: Not on file  . Number of children: Not on file  . Years of education: 9012  . Highest education level: Master's degree (e.g., MA, MS, MEng, MEd, MSW, MBA)  Occupational History  . Occupation: Consulting civil engineerstudent  Social Needs  . Financial resource strain: Not hard at all  . Food insecurity    Worry: Never true    Inability: Never true  . Transportation needs    Medical: No    Non-medical: No  Tobacco Use  . Smoking status: Never Smoker  . Smokeless tobacco: Never Used  Substance and Sexual Activity  . Alcohol use: Yes    Frequency: Never    Comment: social  . Drug use: Never  . Sexual activity: Not Currently  Lifestyle  . Physical activity    Days per week: 6 days    Minutes per session: 60 min  . Stress: Not at all  Relationships  . Social connections    Talks on phone: More than three times a week    Gets together: More than three times a week    Attends religious service: Never    Active member of club or organization: No    Attends meetings of clubs or organizations:  Never    Relationship status: Not on file  . Intimate partner violence    Fear of current or ex partner: No    Emotionally abused: No    Physically abused: No    Forced sexual activity: No  Other Topics Concern  . Not on file  Social History Narrative  . Not on file     Current Outpatient Medications:  .  bictegravir-emtricitabine-tenofovir AF (BIKTARVY) 50-200-25 MG TABS tablet, Take 1 tablet by mouth daily., Disp: 30 tablet, Rfl: 1  No Known Allergies  I personally reviewed active problem list, medication list, allergies, notes from last encounter, lab results with the patient/caregiver today.   ROS Ten systems reviewed and is negative except as mentioned in HPI  Objective  Virtual encounter, vitals not obtained.   There is no height or weight on file to calculate BMI.  Physical Exam Constitutional: Patient appears well-developed and well-nourished. No distress.  HENT: Head: Normocephalic and atraumatic.  Neck: Normal range of motion. Pulmonary/Chest: Effort normal. No respiratory distress. Speaking in complete sentences Neurological: Pt is alert and oriented to person, place, and time. Coordination, speech and gait are normal.  Psychiatric: Patient has a normal mood and affect. behavior is normal. Judgment and thought content normal.  No results found for this or any previous visit (from the past 72 hour(s)).  PHQ2/9: Depression screen PHQ 2/9 10/07/2018  Decreased Interest 1  Down, Depressed, Hopeless 1  PHQ - 2 Score 2  Altered sleeping 0  Tired, decreased energy 0  Change in appetite 0  Feeling bad or failure about yourself  0  Trouble concentrating 0  Moving slowly or fidgety/restless 0  Suicidal thoughts 0  PHQ-9 Score 2  Difficult doing work/chores Not difficult at all   PHQ-2/9 Result is negative.    Fall Risk: Fall Risk  10/07/2018  Falls in the past year? 0  Number falls in past yr: 0  Injury with Fall? 0    Assessment & Plan  1. Anxiety - He will look into counseling with [facility] and other counselors int he area.  We will consider MDQ to determine if other mood disorder is present at next visit if he would like to consider medications. - He is congratulated on wanting to seek care for his mental health and encouraged to continue meditation regularly.  2. Asymptomatic HIV infection ([place]) - Continue with ID.  Doing well.  3. Encounter to establish care  I discussed the assessment and treatment plan with the patient. The patient was provided an opportunity to ask questions and all were answered. The patient agreed with the plan and demonstrated an understanding of the instructions.  The patient was advised to call back or seek an in-person evaluation if the symptoms worsen  or if the condition fails to improve as anticipated.  I provided 22 minutes of non-face-to-face time during this encounter.

## 2018-10-07 NOTE — Patient Instructions (Addendum)
[facility] Therapist finder. [facility] in [place]  12 Ways to [employer] Anxiety  ?Anxiety is normal human sensation. It is what helped our ancestors survive the pitfalls of the wilderness. Anxiety is defined as experiencing worry or nervousness about an imminent event or something with an uncertain outcome. It is a feeling experienced by most people at some point in their lives. Anxiety can be triggered by a very personal issue, such as the illness of a loved one, or an event of global proportions, such as a refugee crisis. Some of the symptoms of anxiety are:  Feeling restless.  Having a feeling of impending danger.  Increased heart rate.  Rapid breathing. Sweating.  Shaking.  Weakness or feeling tired.  Difficulty concentrating on anything except the current worry.  Insomnia.  Stomach or bowel problems. What can we do about anxiety we may be feeling? There are many techniques to help manage stress and relax. Here are 12 ways you can reduce your anxiety almost immediately: 1. Turn off the constant feed of information. Take a social media sabbatical. Studies have shown that social media directly contributes to social anxiety.  2. Monitor your television viewing habits. Are you watching shows that are also contributing to your anxiety, such as 24-hour news stations? Try watching something else, or better yet, nothing at all. Instead, listen to music, read an inspirational book or practice a hobby. 3. Eat nutritious meals. Also, don't skip meals and keep healthful snacks on hand. Hunger and poor diet contributes to feeling anxious. 4. Sleep. Sleeping on a regular schedule for at least seven to eight hours a night will do wonders for your outlook when you are awake. 5. Exercise. Regular exercise will help rid your body of that anxious energy and help you get more restful sleep. 6. Try deep (diaphragmatic) breathing. Inhale slowly through your nose for five seconds and exhale  through your mouth. 7. Practice acceptance and gratitude. When anxiety hits, accept that there are things out of your control that shouldn't be of immediate concern.  8. Seek out humor. When anxiety strikes, watch a funny video, read jokes or call a friend who makes you laugh. Laughter is healing for our bodies and releases endorphins that are calming. 9. Stay positive. Take the effort to replace negative thoughts with positive ones. Try to see a stressful situation in a positive light. Try to come up with solutions rather than dwelling on the problem. 10. Figure out what triggers your anxiety. Keep a journal and make note of anxious moments and the events surrounding them. This will help you identify triggers you can avoid or even eliminate. 11. Talk to someone. Let a trusted friend, family member or even trained professional know that you are feeling overwhelmed and anxious. Verbalize what you are feeling and why.  12. Volunteer. If your anxiety is triggered by a crisis on a large scale, become an advocate and work to resolve the problem that is causing you unease. Anxiety is often unwelcome and can become overwhelming. If not kept in check, it can become a disorder that could require medical treatment. However, if you take the time to care for yourself and avoid the triggers that make you anxious, you will be able to find moments of relaxation and clarity that make your life much more enjoyable.

## 2018-10-11 ENCOUNTER — Encounter: Payer: Self-pay | Admitting: Family Medicine

## 2018-10-11 DIAGNOSIS — F419 Anxiety disorder, unspecified: Secondary | ICD-10-CM

## 2018-10-11 MED ORDER — BUSPIRONE HCL 7.5 MG PO TABS: ORAL_TABLET | ORAL | 1 refills | Status: DC

## 2018-10-28 ENCOUNTER — Other Ambulatory Visit: Payer: Self-pay | Admitting: Licensed Clinical Social Worker

## 2018-10-28 DIAGNOSIS — B20 Human immunodeficiency virus [HIV] disease: Secondary | ICD-10-CM

## 2018-10-28 MED ORDER — BIKTARVY 50-200-25 MG PO TABS: 1.0000 | ORAL_TABLET | Freq: Every day | ORAL | 3 refills | Status: DC

## 2018-11-04 ENCOUNTER — Ambulatory Visit: Payer: PRIVATE HEALTH INSURANCE | Admitting: Family Medicine

## 2018-11-17 ENCOUNTER — Encounter: Payer: Self-pay | Admitting: Family Medicine

## 2018-11-25 ENCOUNTER — Other Ambulatory Visit: Payer: Self-pay

## 2018-11-25 ENCOUNTER — Other Ambulatory Visit (HOSPITAL_COMMUNITY)
Admission: RE | Admit: 2018-11-25 | Discharge: 2018-11-25 | Disposition: A | Discharge status: Home / Self Care | Payer: PRIVATE HEALTH INSURANCE | Source: Ambulatory Visit | Attending: Family Medicine | Admitting: Family Medicine

## 2018-11-25 ENCOUNTER — Ambulatory Visit: Payer: PRIVATE HEALTH INSURANCE | Admitting: Family Medicine

## 2018-11-25 ENCOUNTER — Encounter: Payer: Self-pay | Admitting: Family Medicine

## 2018-11-25 VITALS — BP 120/68 | HR 89 | Temp 97.3°F | Resp 16 | Ht 68.0 in | Wt 160.8 lb

## 2018-11-25 DIAGNOSIS — G4700 Insomnia, unspecified: Secondary | ICD-10-CM | POA: Diagnosis not present

## 2018-11-25 DIAGNOSIS — Z21 Asymptomatic human immunodeficiency virus [HIV] infection status: Secondary | ICD-10-CM | POA: Diagnosis not present

## 2018-11-25 DIAGNOSIS — F39 Unspecified mood [affective] disorder: Secondary | ICD-10-CM | POA: Diagnosis not present

## 2018-11-25 DIAGNOSIS — Z113 Encounter for screening for infections with a predominantly sexual mode of transmission: Secondary | ICD-10-CM | POA: Diagnosis not present

## 2018-11-25 DIAGNOSIS — E7800 Pure hypercholesterolemia, unspecified: Secondary | ICD-10-CM

## 2018-11-25 MED ORDER — QUETIAPINE FUMARATE 50 MG PO TABS: 50.0000 mg | ORAL_TABLET | Freq: Every day | ORAL | 1 refills | Status: DC

## 2018-11-25 NOTE — Progress Notes (Signed)
Name: Darrell Kennedy   MRN: [id number]    DOB: Sep 25, 1980   Date:11/25/2018       Progress Note  Subjective  Chief Complaint  Chief Complaint  Patient presents with  . Follow-up    medication seen 5 weeks ago for 1st virtual appointment  . Labwork    for STD check    HPI  Mood Disorder and Insomnia: He has been taking buspar daily, he will sometimes need a second dose for increased anxiety. He feels like the buspar does help, but it is not very effective and he feels it causes him to have trouble sleeping at night. We discussed medication in great detail today - we will trial trazodone at night and hydroxyzine PRN for anxiety. He thinks his father may have undiagnosed bipolar; patient has done a lot of reading about bipolar, but feels he struggles more with anxiety and fear. Growing up, he had a traumatic childhood - father did not control his anger in a healthy way.  MDQ is quite positive - we will send to psychiatry and start on Seroquel today.  HIV: Diagnosed in 2015. Seeing Darrell Kennedy; has been compliant with medications.  Has never had any complications or complex infections. Taking triple therapy and doing well.  He does request repeat RPR and gonorrhea/chlamydia testing - we will provide for this today; 1 new partner since last visit.  Elevated LDL: He had elevated LDL at 158 on Sep 10 2018, but notes had had unhealthy diet prior to this.   Patient Active Problem List   Diagnosis Date Noted  . HIV infection (HCC) 10/07/2018    No past surgical history on file.  Family History  Problem Relation Age of Onset  . Hypertension Father   . Diabetes Father   . Kidney disease Father   . Depression Father   . Hyperlipidemia Father   . Heart disease Paternal Grandmother     Social History   Socioeconomic History  . Marital status: Single    Spouse name: Not on file  . Number of children: Not on file  . Years of education: 4212  . Highest education level: Master's  degree (e.g., MA, MS, MEng, MEd, MSW, MBA)  Occupational History  . Occupation: Consulting civil engineerstudent  Social Needs  . Financial resource strain: Not hard at all  . Food insecurity    Worry: Never true    Inability: Never true  . Transportation needs    Medical: No    Non-medical: No  Tobacco Use  . Smoking status: Never Smoker  . Smokeless tobacco: Never Used  Substance and Sexual Activity  . Alcohol use: Yes    Frequency: Never    Comment: social  . Drug use: Never  . Sexual activity: Not Currently  Lifestyle  . Physical activity    Days per week: 6 days    Minutes per session: 60 min  . Stress: Not at all  Relationships  . Social connections    Talks on phone: More than three times a week    Gets together: More than three times a week    Attends religious service: Never    Active member of club or organization: No    Attends meetings of clubs or organizations: Never    Relationship status: Not on file  . Intimate partner violence    Fear of current or ex partner: No    Emotionally abused: No    Physically abused: No    Forced sexual activity:  No  Other Topics Concern  . Not on file  Social History Narrative  . Not on file     Current Outpatient Medications:  .  bictegravir-emtricitabine-tenofovir AF (BIKTARVY) 50-200-25 MG TABS tablet, Take 1 tablet by mouth daily., Disp: 30 tablet, Rfl: 3 .  busPIRone (BUSPAR) 7.5 MG tablet, Take 1 tablet each night at bedtime.  Take additional 1 tablet during the day if needed., Disp: 45 tablet, Rfl: 1  No Known Allergies  I personally reviewed active problem list, medication list, allergies with the patient/caregiver today.   ROS  Constitutional: Negative for fever or weight change.  Respiratory: Negative for cough and shortness of breath.   Cardiovascular: Negative for chest pain or palpitations.  Gastrointestinal: Negative for abdominal pain, no bowel changes.  Musculoskeletal: Negative for gait problem or joint swelling.  Skin:  Negative for rash.  Neurological: Negative for dizziness or headache.  No other specific complaints in a complete review of systems (except as listed in HPI above).  Objective  Vitals:   11/25/18 0928  BP: 120/68  Pulse: 89  Resp: 16  Temp: (!) 97.3 F (36.3 C)  TempSrc: Oral  SpO2: 99%  Weight: 160 lb 12.8 oz (72.9 kg)  Height: 5\' 8"  (1.727 m)   Body mass index is 24.45 kg/m.  Physical Exam  Constitutional: Patient appears well-developed and well-nourished. No distress.  HENT: Head: Normocephalic and atraumatic.  Eyes: Conjunctivae and EOM are normal. No scleral icterus. Neck: Normal range of motion. Neck supple. No JVD present. No thyromegaly present.  Cardiovascular: Normal rate, regular rhythm and normal heart sounds.  No murmur heard. No BLE edema. Pulmonary/Chest: Effort normal and breath sounds normal. No respiratory distress. Musculoskeletal: Normal range of motion, no joint effusions. No gross deformities Neurological: Pt is alert and oriented to person, place, and time. No cranial nerve deficit. Coordination, balance, strength, speech and gait are normal.  Skin: Skin is warm and dry. No rash noted. No erythema.  Psychiatric: Patient has a normal mood and affect. behavior is normal. Judgment and thought content normal.  No results found for this or any previous visit (from the past 72 hour(s)).  PHQ2/9: Depression screen [facility] 2/9 11/25/2018 10/07/2018  Decreased Interest 1 1  Down, Depressed, Hopeless 1 1  PHQ - 2 Score 2 2  Altered sleeping 3 0  Tired, decreased energy 1 0  Change in appetite 1 0  Feeling bad or failure about yourself  1 0  Trouble concentrating 2 0  Moving slowly or fidgety/restless 1 0  Suicidal thoughts 0 0  PHQ-9 Score 11 2  Difficult doing work/chores Somewhat difficult Not difficult at all   PHQ-2/9 Result is positive.    Fall Risk: Fall Risk  11/25/2018 10/07/2018  Falls in the past year? 1 0  Number falls in past yr: 0 0  Injury  with Fall? 0 0  Follow up Falls evaluation completed -   Assessment & Plan  1. Mood disorder (HCC) - MDQ is positive - referral is placed - QUEtiapine (SEROQUEL) 50 MG tablet; Take 1 tablet (50 mg total) by mouth at bedtime.  Dispense: 30 tablet; Refill: 1 - Ambulatory referral to Psychiatry  2. Insomnia, unspecified type - QUEtiapine (SEROQUEL) 50 MG tablet; Take 1 tablet (50 mg total) by mouth at bedtime.  Dispense: 30 tablet; Refill: 1 - Ambulatory referral to Psychiatry  3. Asymptomatic HIV infection ([place]) - Keep follow up with Dr. Delaine Lame  4. Routine screening for STI (sexually transmitted infection) -  RPR - Urine cytology ancillary only  5. Elevated LDL cholesterol level - Lipid panel  Patient does ask about low T at the end of his visit - discussed in detail and he notes episodes of lethargy/fatigue in his life, has had some bouts of decreased libido.  We will discuss further after he is evaluated by psychiatry first.

## 2018-11-26 ENCOUNTER — Encounter: Payer: Self-pay | Admitting: Family Medicine

## 2018-11-26 DIAGNOSIS — F419 Anxiety disorder, unspecified: Secondary | ICD-10-CM

## 2018-11-26 DIAGNOSIS — F39 Unspecified mood [affective] disorder: Secondary | ICD-10-CM

## 2018-11-26 DIAGNOSIS — E782 Mixed hyperlipidemia: Secondary | ICD-10-CM

## 2018-11-26 LAB — LIPID PANEL
Cholesterol: 231 mg/dL — ABNORMAL HIGH (ref <200)
HDL: 45 mg/dL (ref >=40)
LDL Cholesterol (Calc): 159 mg/dL (calc) — ABNORMAL HIGH
Non-HDL Cholesterol (Calc): 186 mg/dL (calc) — ABNORMAL HIGH (ref <130)
Total CHOL/HDL Ratio: 5.1 (calc) — ABNORMAL HIGH (ref <5.0)
Triglycerides: 138 mg/dL (ref <150)

## 2018-11-26 LAB — URINE CYTOLOGY ANCILLARY ONLY
Chlamydia: NEGATIVE
Neisseria Gonorrhea: NEGATIVE

## 2018-11-26 LAB — RPR: RPR Ser Ql: NONREACTIVE

## 2018-11-27 DIAGNOSIS — E7800 Pure hypercholesterolemia, unspecified: Secondary | ICD-10-CM | POA: Insufficient documentation

## 2018-11-27 MED ORDER — ROSUVASTATIN CALCIUM 10 MG PO TABS: 10.0000 mg | ORAL_TABLET | Freq: Every day | ORAL | 3 refills | Status: DC

## 2018-11-29 MED ORDER — HYDROXYZINE HCL 10 MG PO TABS: 10.0000 mg | ORAL_TABLET | Freq: Three times a day (TID) | ORAL | 0 refills | Status: DC | PRN

## 2018-11-29 NOTE — Addendum Note (Signed)
Addended by: Hubbard Hartshorn on: 11/29/2018 03:13 PM   Modules accepted: Orders

## 2019-01-08 ENCOUNTER — Encounter: Payer: Self-pay | Admitting: Family Medicine

## 2019-01-08 DIAGNOSIS — G4700 Insomnia, unspecified: Secondary | ICD-10-CM

## 2019-01-08 DIAGNOSIS — F39 Unspecified mood [affective] disorder: Secondary | ICD-10-CM

## 2019-01-09 MED ORDER — QUETIAPINE FUMARATE 50 MG PO TABS: 50.0000 mg | ORAL_TABLET | Freq: Every day | ORAL | 0 refills | Status: DC

## 2019-01-15 ENCOUNTER — Encounter: Payer: Self-pay | Admitting: Family Medicine

## 2019-01-15 DIAGNOSIS — F39 Unspecified mood [affective] disorder: Secondary | ICD-10-CM

## 2019-01-15 DIAGNOSIS — G4700 Insomnia, unspecified: Secondary | ICD-10-CM

## 2019-01-16 DIAGNOSIS — Z8673 Personal history of transient ischemic attack (TIA), and cerebral infarction without residual deficits: Secondary | ICD-10-CM

## 2019-01-16 HISTORY — DX: Personal history of transient ischemic attack (TIA), and cerebral infarction without residual deficits: Z86.73

## 2019-01-17 MED ORDER — QUETIAPINE FUMARATE 50 MG PO TABS: 75.0000 mg | ORAL_TABLET | Freq: Every day | ORAL | 0 refills | Status: DC

## 2019-01-17 NOTE — Addendum Note (Signed)
Addended by: Hubbard Hartshorn on: 01/17/2019 07:27 AM   Modules accepted: Orders

## 2019-01-24 ENCOUNTER — Other Ambulatory Visit: Payer: Self-pay

## 2019-01-24 ENCOUNTER — Ambulatory Visit (INDEPENDENT_AMBULATORY_CARE_PROVIDER_SITE_OTHER): Payer: PRIVATE HEALTH INSURANCE

## 2019-01-24 DIAGNOSIS — Z23 Encounter for immunization: Secondary | ICD-10-CM

## 2019-02-06 ENCOUNTER — Ambulatory Visit (INDEPENDENT_AMBULATORY_CARE_PROVIDER_SITE_OTHER): Payer: PRIVATE HEALTH INSURANCE | Admitting: Psychiatry

## 2019-02-06 ENCOUNTER — Encounter: Payer: Self-pay | Admitting: Psychiatry

## 2019-02-06 ENCOUNTER — Encounter

## 2019-02-06 ENCOUNTER — Other Ambulatory Visit: Payer: Self-pay

## 2019-02-06 ENCOUNTER — Telehealth: Payer: Self-pay

## 2019-02-06 ENCOUNTER — Encounter: Payer: Self-pay | Admitting: Family Medicine

## 2019-02-06 DIAGNOSIS — F3181 Bipolar II disorder: Secondary | ICD-10-CM | POA: Diagnosis not present

## 2019-02-06 DIAGNOSIS — Z9189 Other specified personal risk factors, not elsewhere classified: Secondary | ICD-10-CM

## 2019-02-06 DIAGNOSIS — F3110 Bipolar disorder, current episode manic without psychotic features, unspecified: Secondary | ICD-10-CM | POA: Insufficient documentation

## 2019-02-06 MED ORDER — BENZTROPINE MESYLATE 0.5 MG PO TABS: 0.5000 mg | ORAL_TABLET | Freq: Every evening | ORAL | 1 refills | Status: DC | PRN

## 2019-02-06 MED ORDER — QUETIAPINE FUMARATE 100 MG PO TABS: 100.0000 mg | ORAL_TABLET | Freq: Every day | ORAL | 1 refills | Status: DC

## 2019-02-06 NOTE — Telephone Encounter (Signed)
lab work orders mailed out  
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
## 2019-02-06 NOTE — Patient Instructions (Signed)
Benztropine tablets What is this medicine? BENZTROPINE (BENZ troe peen) is for certain movement problems due to Parkinson's disease, certain medicines, or other causes. This medicine may be used for other purposes; ask your health care provider or pharmacist if you have questions. COMMON BRAND NAME(S): Cogentin What should I tell my health care provider before I take this medicine? They need to know if you have any of these conditions:  glaucoma  heart disease or a rapid heartbeat  mental problems  prostate trouble  tardive dyskinesia  an unusual or allergic reaction to benztropine, other medicines, lactose, foods, dyes, or preservatives  pregnant or trying to get pregnant  breast-feeding How should I use this medicine? Take this medicine by mouth with a full glass of water. Follow the directions on the prescription label. Take your medicine at regular intervals. Do not take your medicine more often than directed. Talk to your pediatrician regarding the use of this medicine in children. While this drug may be prescribed for children as young as 3 years for selected conditions, precautions do apply. Overdosage: If you think you have taken too much of this medicine contact a poison control center or emergency room at once. NOTE: This medicine is only for you. Do not share this medicine with others. What if I miss a dose? If you miss a dose, take it as soon as you can. If it is almost time for your next dose, take only that dose. Do not take double or extra doses. What may interact with this medicine?  haloperidol  medicines for movement abnormalities like Parkinson's disease  phenothiazines like chlorpromazine, mesoridazine, prochlorperazine, thioridazine  some antidepressants like amitriptyline, desipramine, doxepin, nortriptyline  stimulant medicines for attention, weight loss, and to stay awake  tegaserod This list may not describe all possible interactions. Give your  health care provider a list of all the medicines, herbs, non-prescription drugs, or dietary supplements you use. Also tell them if you smoke, drink alcohol, or use illegal drugs. Some items may interact with your medicine. What should I watch for while using this medicine? Visit your doctor or health care professional for regular checks on your progress. You may get drowsy or dizzy. Do not drive, use machinery, or do anything that needs mental alertness until you know how this medicine affects you. Do not stand or sit up quickly, especially if you are an older patient. This reduces the risk of dizzy or fainting spells. Alcohol may interfere with the effect of this medicine. Avoid alcoholic drinks. Your mouth may get dry. Chewing sugarless gum or sucking hard candy, and drinking plenty of water may help. Contact your doctor if the problem does not go away or is severe. This medicine may cause dry eyes and blurred vision. If you wear contact lenses you may feel some discomfort. Lubricating drops may help. See your eye doctor if the problem does not go away or is severe. You may sweat less than usual while you are taking this medicine. As a result your body temperature could rise to a dangerous level. Be careful not to get overheated during exercise or in hot weather. You could get heat stroke. Avoid taking hot baths and using hot tubs and saunas. What side effects may I notice from receiving this medicine? Side effects that you should report to your doctor or health care professional as soon as possible:  allergic reactions like skin rash, itching or hives, swelling of the face, lips, or tongue  changes in vision  confusion    decreased sweating or heat intolerance  depression  fast, irregular heartbeat  hallucinations  memory loss  muscle weakness  pain or difficulty passing urine  vomiting Side effects that usually do not require medical attention (report to your doctor or health care  professional if they continue or are bothersome):  constipation  dry mouth  nausea This list may not describe all possible side effects. Call your doctor for medical advice about side effects. You may report side effects to FDA at 1-800-FDA-1088. Where should I keep my medicine? Keep out of the reach of children. Store below 30 degrees C (86 degrees F). Keep container tightly closed. Throw away any unused medicine after the expiration date. NOTE: This sheet is a summary. It may not cover all possible information. If you have questions about this medicine, talk to your doctor, pharmacist, or health care provider.  2020 Elsevier/Gold Standard (2007-07-03 15:38:20)  

## 2019-02-07 ENCOUNTER — Telehealth (INDEPENDENT_AMBULATORY_CARE_PROVIDER_SITE_OTHER): Payer: PRIVATE HEALTH INSURANCE | Admitting: Emergency Medicine

## 2019-02-07 DIAGNOSIS — Z5181 Encounter for therapeutic drug level monitoring: Secondary | ICD-10-CM

## 2019-02-07 NOTE — Telephone Encounter (Signed)
Copied from [place] [phone]. Topic: General - Other >> Feb 06, 2019  4:14 PM Rainey Pines A wrote: Patient called to get EKG and thyroid lab ordered before Nov.16th. Please advice.

## 2019-02-07 NOTE — Addendum Note (Signed)
Addended by: Hubbard Hartshorn on: 02/07/2019 03:24 PM   Modules accepted: Orders

## 2019-02-07 NOTE — Telephone Encounter (Signed)
Orders are in - he can stop by next week to do these.

## 2019-02-07 NOTE — Telephone Encounter (Signed)
He stated that they said he could let you do and send to him.

## 2019-02-07 NOTE — Telephone Encounter (Signed)
Whoever you refer him to have a EKG and TSH. Please give me a order.I told his he can stop in on next Wednesday

## 2019-02-07 NOTE — Telephone Encounter (Signed)
It looks like Dr. Shea Evans already placed both orders - not sure if he should be getting EKG at their office or with [place]? [facility] I think.

## 2019-02-10 NOTE — Telephone Encounter (Signed)
Patient coming on Wednesday 10/28

## 2019-02-12 ENCOUNTER — Other Ambulatory Visit: Payer: Self-pay

## 2019-02-12 ENCOUNTER — Observation Stay
Admission: EM | Admit: 2019-02-12 | Discharge: 2019-02-13 | Disposition: A | Discharge status: Home / Self Care | Payer: PRIVATE HEALTH INSURANCE | Attending: Internal Medicine | Admitting: Internal Medicine

## 2019-02-12 ENCOUNTER — Emergency Department: Payer: PRIVATE HEALTH INSURANCE

## 2019-02-12 ENCOUNTER — Ambulatory Visit: Payer: PRIVATE HEALTH INSURANCE

## 2019-02-12 DIAGNOSIS — G459 Transient cerebral ischemic attack, unspecified: Secondary | ICD-10-CM | POA: Diagnosis present

## 2019-02-12 DIAGNOSIS — R002 Palpitations: Secondary | ICD-10-CM | POA: Diagnosis not present

## 2019-02-12 DIAGNOSIS — R531 Weakness: Secondary | ICD-10-CM | POA: Insufficient documentation

## 2019-02-12 DIAGNOSIS — R0789 Other chest pain: Secondary | ICD-10-CM | POA: Diagnosis not present

## 2019-02-12 DIAGNOSIS — B20 Human immunodeficiency virus [HIV] disease: Secondary | ICD-10-CM | POA: Diagnosis not present

## 2019-02-12 DIAGNOSIS — Z20828 Contact with and (suspected) exposure to other viral communicable diseases: Secondary | ICD-10-CM | POA: Insufficient documentation

## 2019-02-12 DIAGNOSIS — Z79899 Other long term (current) drug therapy: Secondary | ICD-10-CM | POA: Diagnosis not present

## 2019-02-12 DIAGNOSIS — E785 Hyperlipidemia, unspecified: Secondary | ICD-10-CM | POA: Diagnosis not present

## 2019-02-12 DIAGNOSIS — F3181 Bipolar II disorder: Secondary | ICD-10-CM | POA: Diagnosis not present

## 2019-02-12 LAB — CBC WITH DIFFERENTIAL/PLATELET
Abs Immature Granulocytes: 0.03 10*3/uL (ref 0.00–0.07)
Basophils Absolute: 0.1 10*3/uL (ref 0.0–0.1)
Basophils Relative: 1 %
Eosinophils Absolute: 0.3 10*3/uL (ref 0.0–0.5)
Eosinophils Relative: 3 %
HCT: 43 % (ref 39.0–52.0)
Hemoglobin: 14.3 g/dL (ref 13.0–17.0)
Immature Granulocytes: 0 %
Lymphocytes Relative: 46 %
Lymphs Abs: 4.2 10*3/uL — ABNORMAL HIGH (ref 0.7–4.0)
MCH: 29.1 pg (ref 26.0–34.0)
MCHC: 33.3 g/dL (ref 30.0–36.0)
MCV: 87.4 fL (ref 80.0–100.0)
Monocytes Absolute: 0.6 10*3/uL (ref 0.1–1.0)
Monocytes Relative: 7 %
Neutro Abs: 3.9 10*3/uL (ref 1.7–7.7)
Neutrophils Relative %: 43 %
Platelets: 307 10*3/uL (ref 150–400)
RBC: 4.92 MIL/uL (ref 4.22–5.81)
RDW: 11.6 % (ref 11.5–15.5)
WBC: 9.1 10*3/uL (ref 4.0–10.5)
nRBC: 0 % (ref 0.0–0.2)

## 2019-02-12 LAB — COMPREHENSIVE METABOLIC PANEL
ALT: 28 U/L (ref 0–44)
AST: 43 U/L — ABNORMAL HIGH (ref 15–41)
Albumin: 4.3 g/dL (ref 3.5–5.0)
Alkaline Phosphatase: 59 U/L (ref 38–126)
Anion gap: 9 (ref 5–15)
BUN: 18 mg/dL (ref 6–20)
CO2: 27 mmol/L (ref 22–32)
Calcium: 9.2 mg/dL (ref 8.9–10.3)
Chloride: 100 mmol/L (ref 98–111)
Creatinine, Ser: 1.39 mg/dL — ABNORMAL HIGH (ref 0.61–1.24)
GFR calc Af Amer: >60 mL/min (ref >60)
GFR calc non Af Amer: >60 mL/min (ref >60)
Glucose, Bld: 118 mg/dL — ABNORMAL HIGH (ref 70–99)
Potassium: 3.5 mmol/L (ref 3.5–5.1)
Sodium: 136 mmol/L (ref 135–145)
Total Bilirubin: 0.6 mg/dL (ref 0.3–1.2)
Total Protein: 7.6 g/dL (ref 6.5–8.1)

## 2019-02-12 LAB — TROPONIN I (HIGH SENSITIVITY): Troponin I (High Sensitivity): 4 ng/L (ref <18)

## 2019-02-12 IMAGING — CR DG CHEST 2V
1 series · 2 of 2 positions shown · non-contrast
Comparison: None.

CLINICAL DATA: Chest pain

EXAM:
CHEST - 2 VIEW

[Series 1: w chest pa · 0.14mm/px · 2 of 2 slices shown]
[im 1/2]
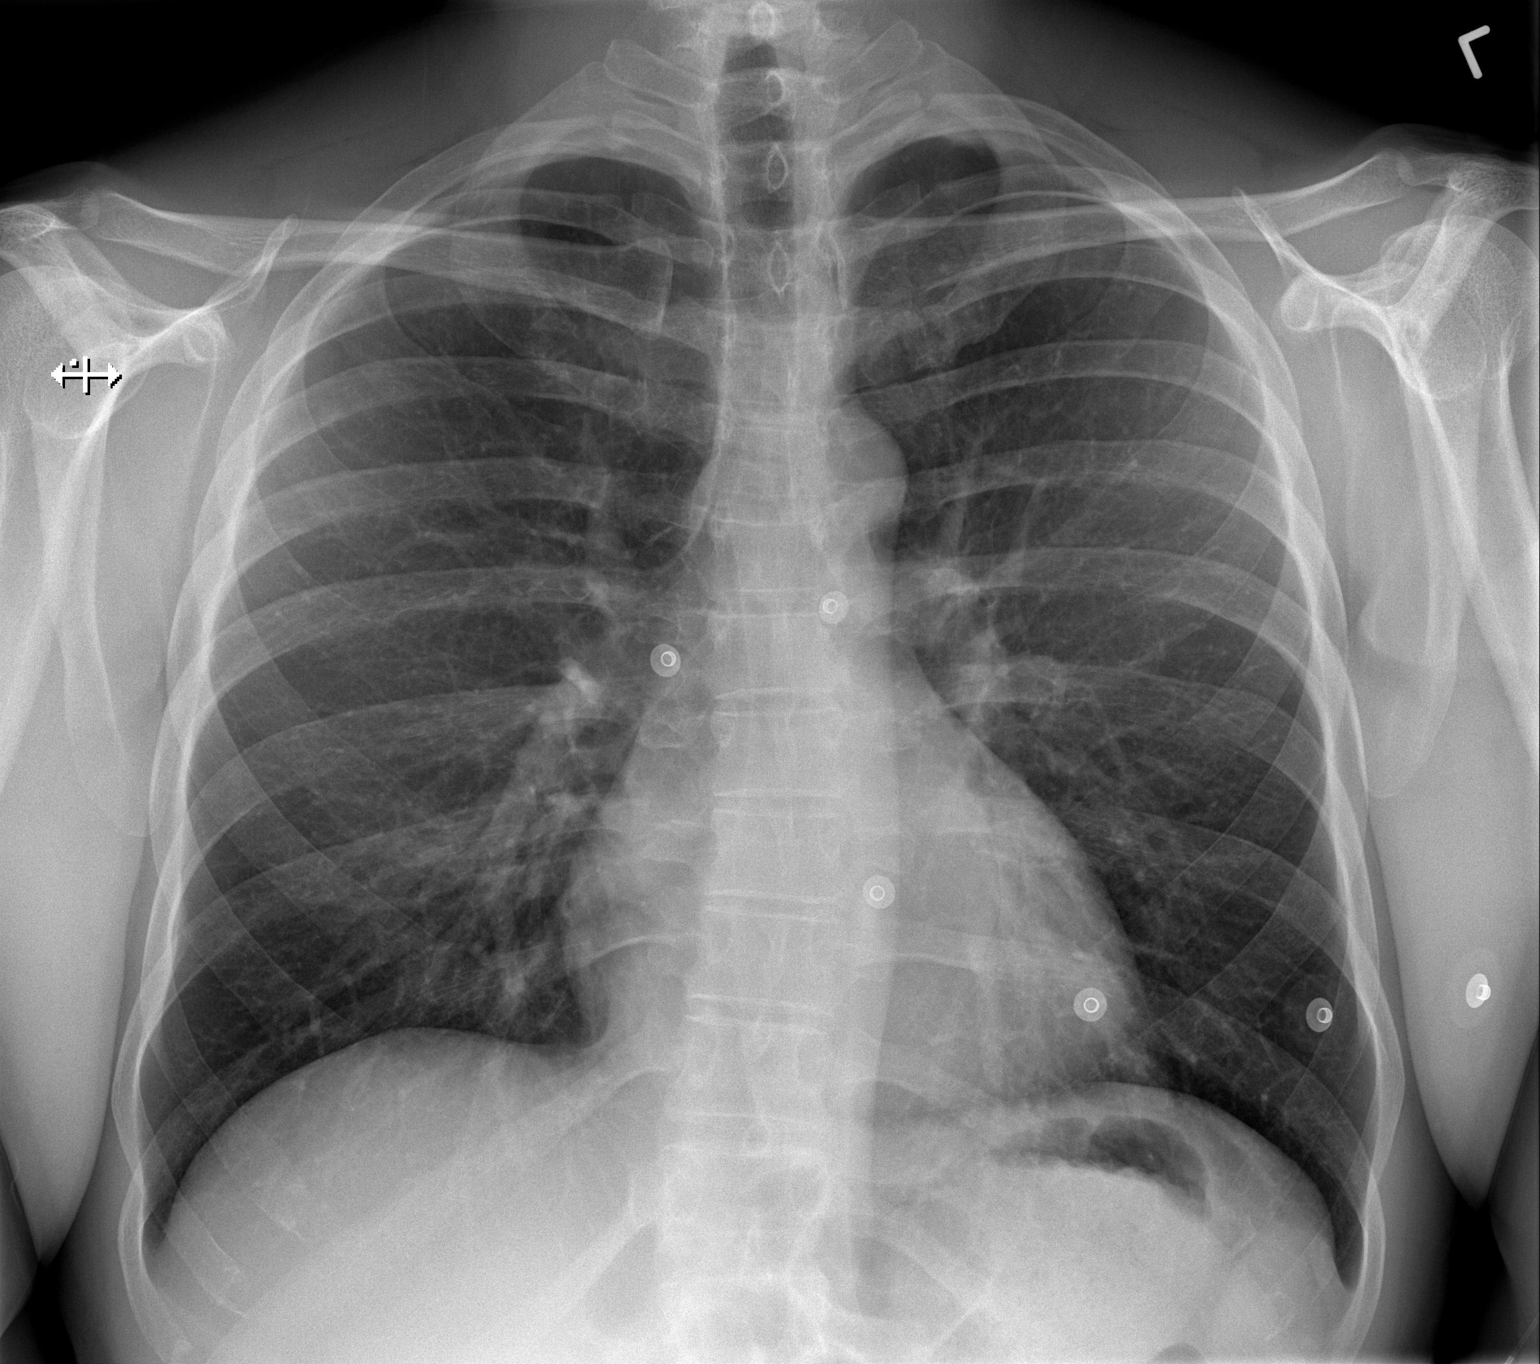
[im 2/2]
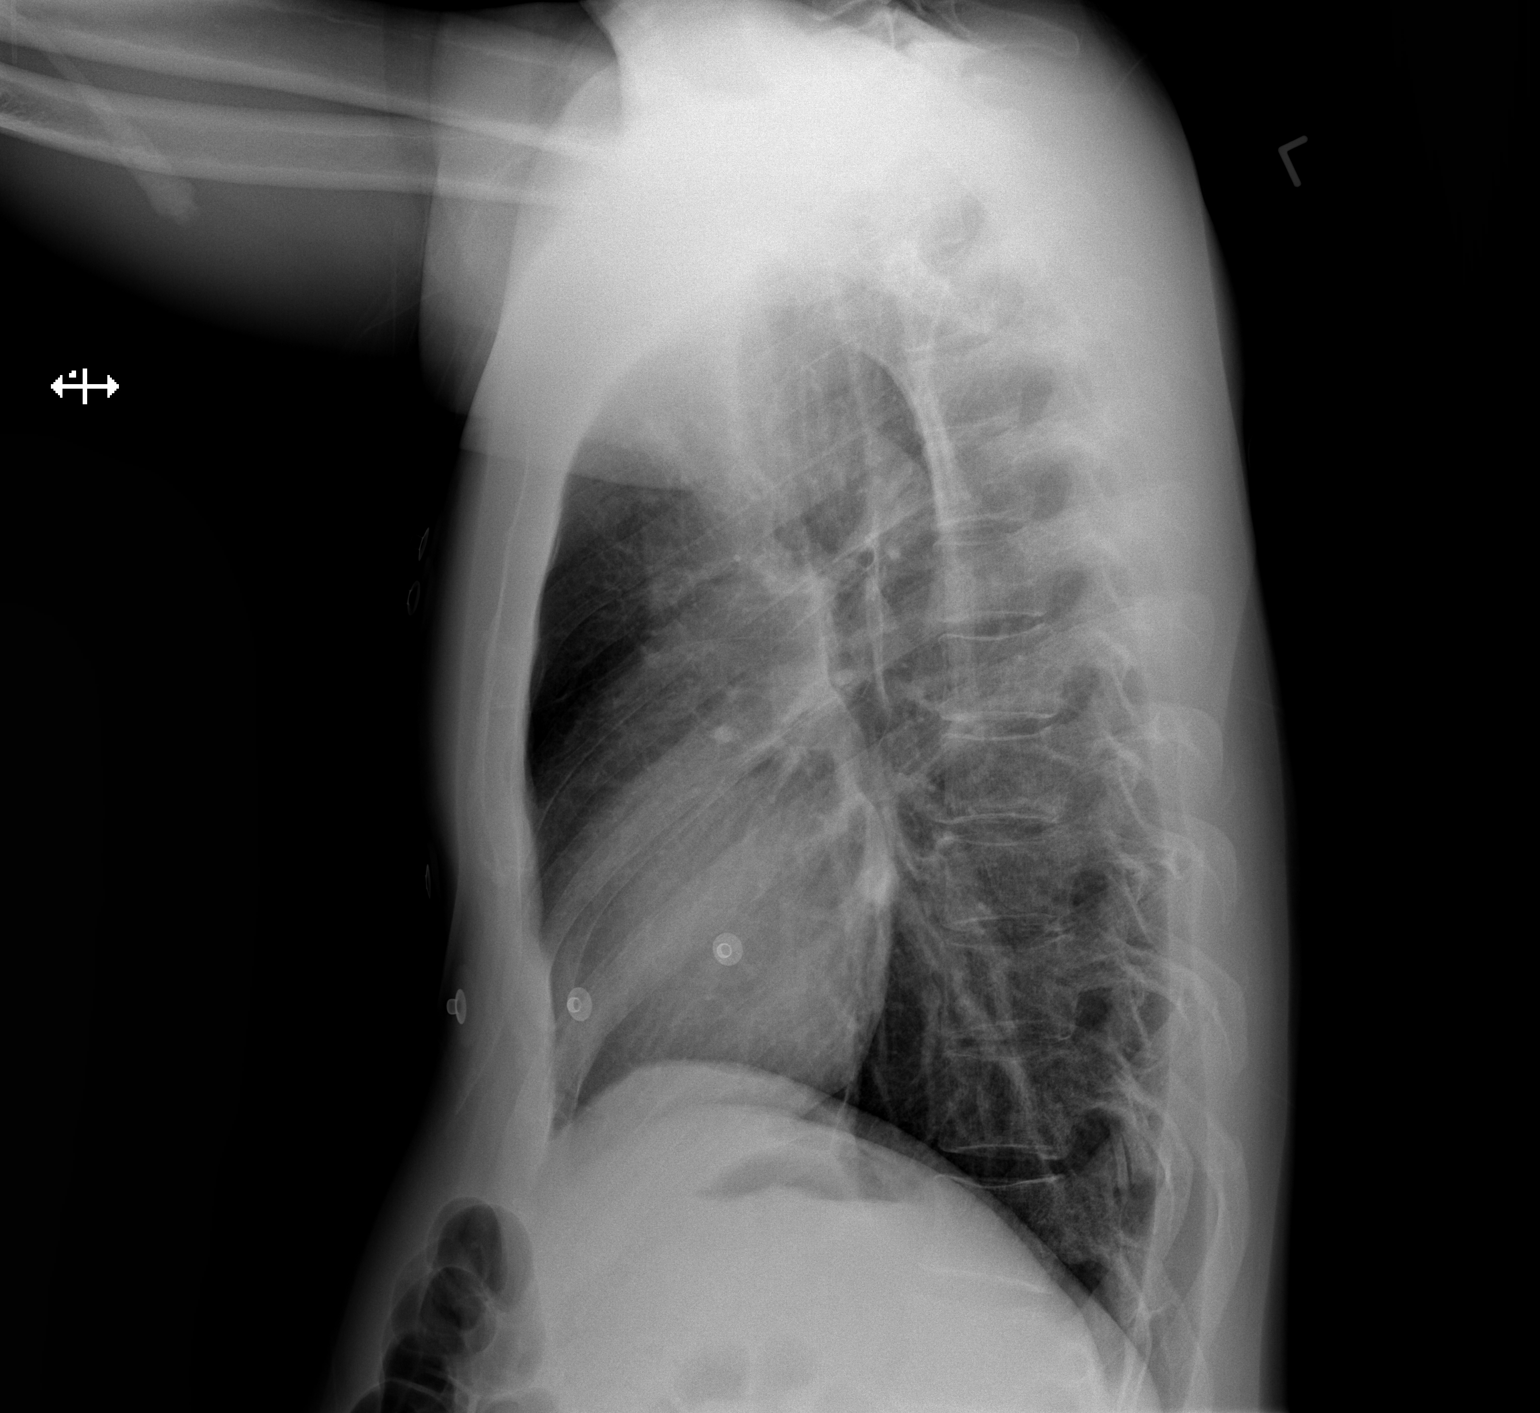

[2 of 2 positions shown; findings below may reference images not displayed]

FINDINGS: The heart size and mediastinal contours are within normal limits.
Both lungs are clear. The visualized skeletal structures are
unremarkable.
IMPRESSION: No active cardiopulmonary disease.

## 2019-02-12 IMAGING — CT CT HEAD W/O CM
3 series · 15 of 45 positions shown, 18 images · non-contrast
Comparison: None.

CLINICAL DATA: 38-year-old male with left chest tightness and
fluttering and left arm coldness.

EXAM:
CT HEAD WITHOUT CONTRAST
TECHNIQUE: Contiguous axial images were obtained from the base of the skull
through the vertex without intravenous contrast.

[Series 2: head wo · axial · 0.41mm/px · z∈[+28,+143]mm · 9 of 28 slices shown, 12 images]
[im 3/28  brain]
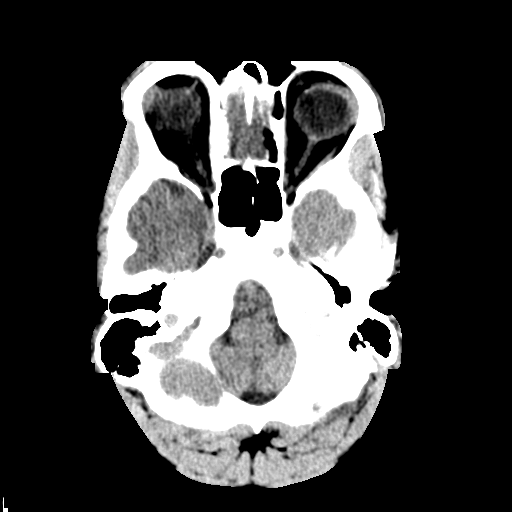
[im 3/28  bone]
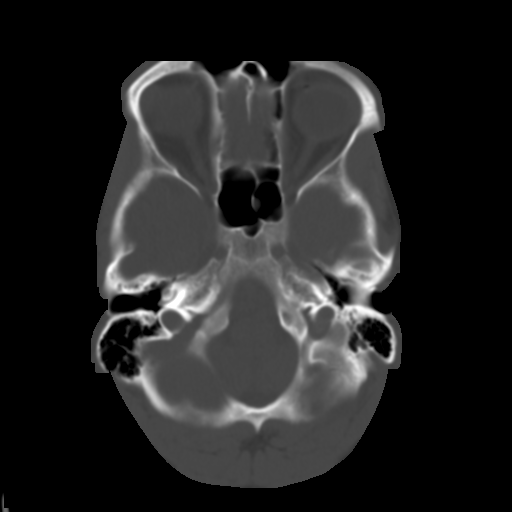
[im 6/28  brain]
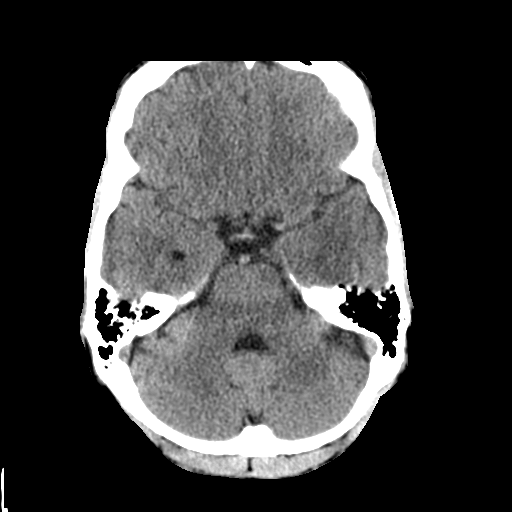
[im 9/28  brain]
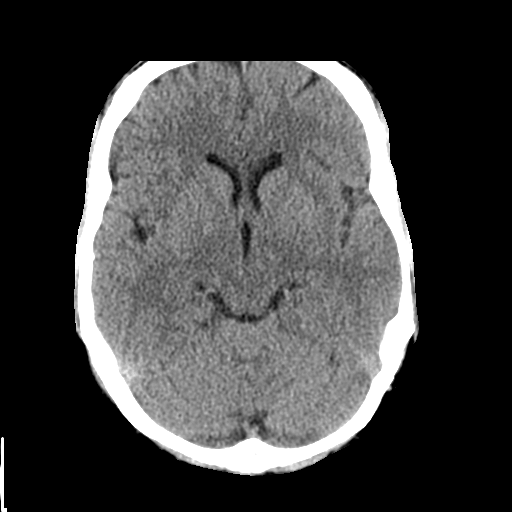
[im 12/28  brain]
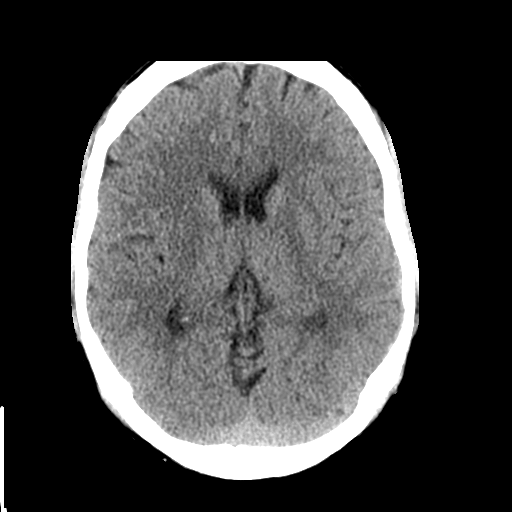
[im 15/28  brain]
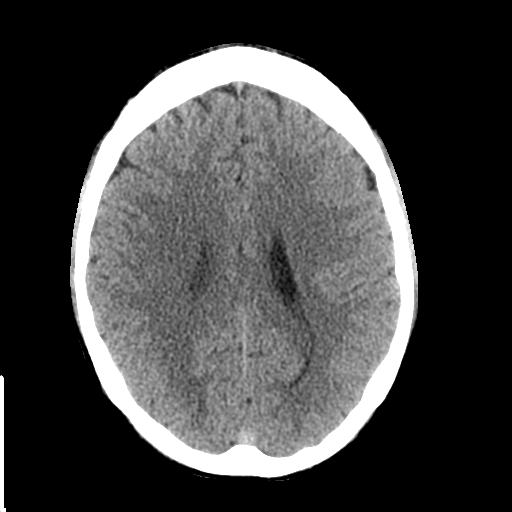
[im 15/28  bone]
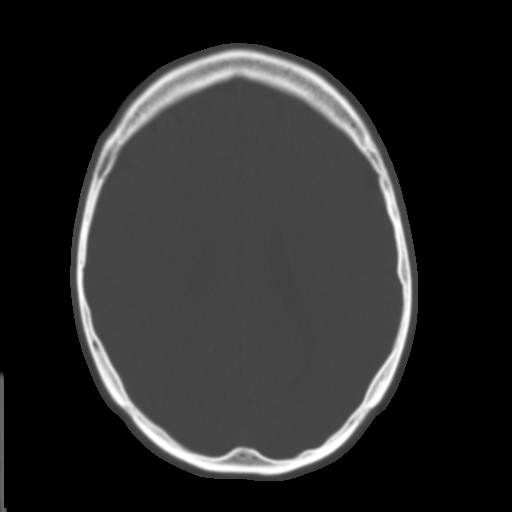
[im 17/28  brain]
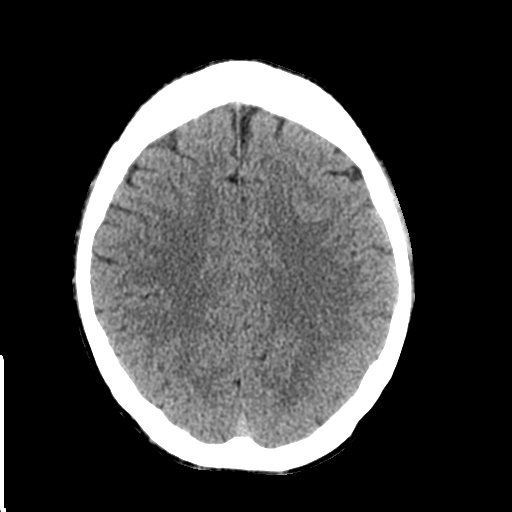
[im 20/28  brain]
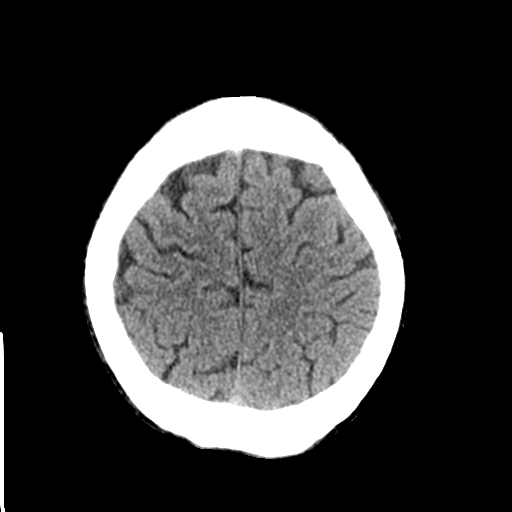
[im 23/28  brain]
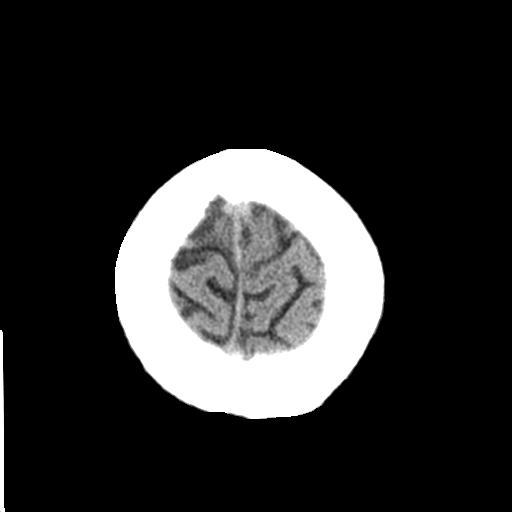
[im 26/28  brain]
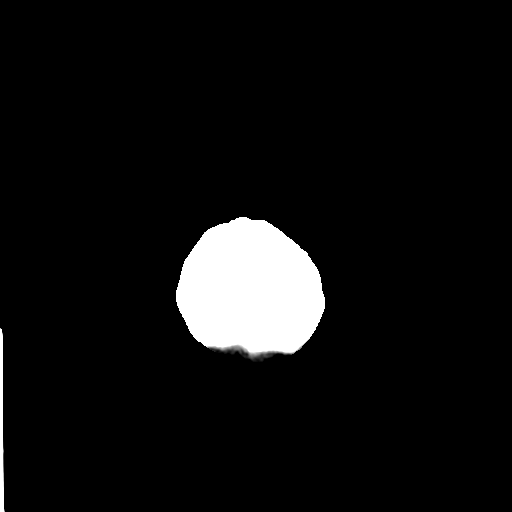
[im 26/28  bone]
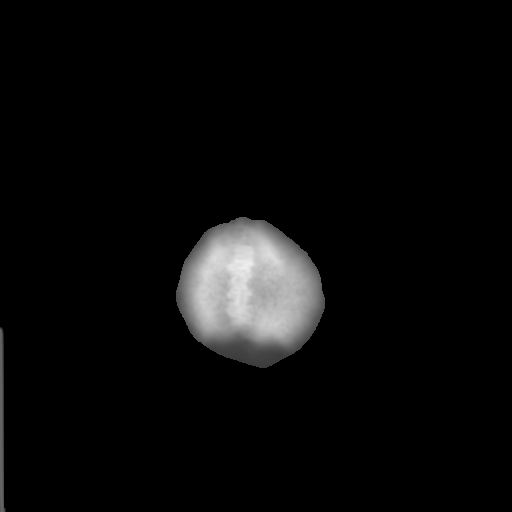

[Series 4: coronal soft tissue · coronal · 0.28mm/px · 3 of 63 slices shown]
[im 21/63  brain]
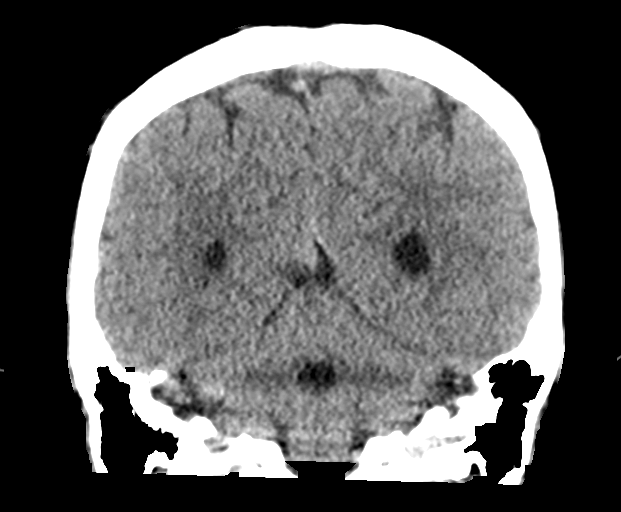
[im 28/63  brain]
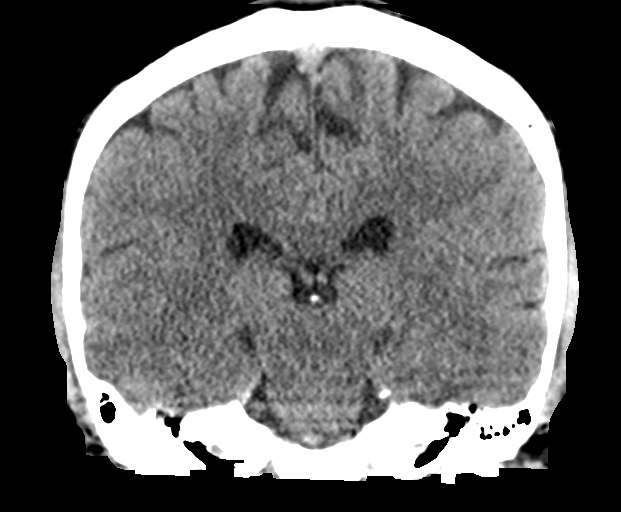
[im 35/63  brain]
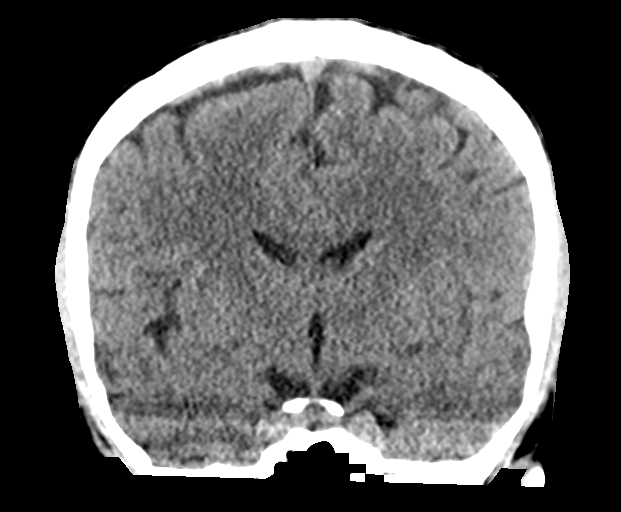

[Series 5: sagittal soft tissue · sagittal · 0.28mm/px · 3 of 52 slices shown]
[im 18/52  brain]
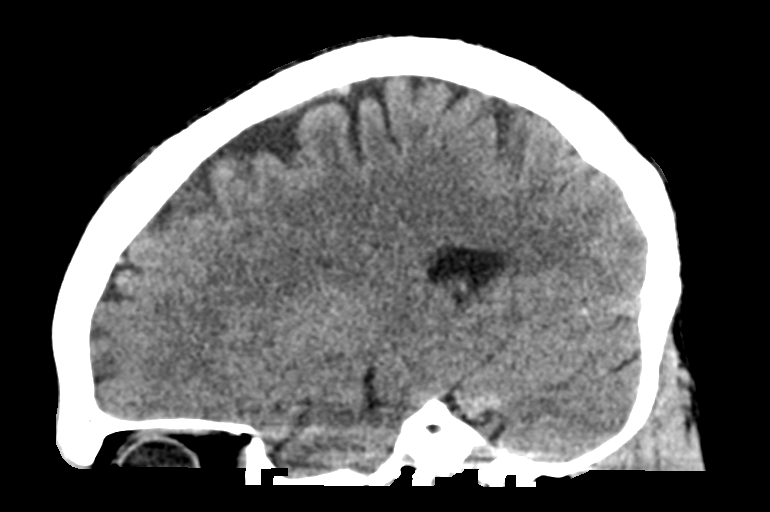
[im 26/52  brain]
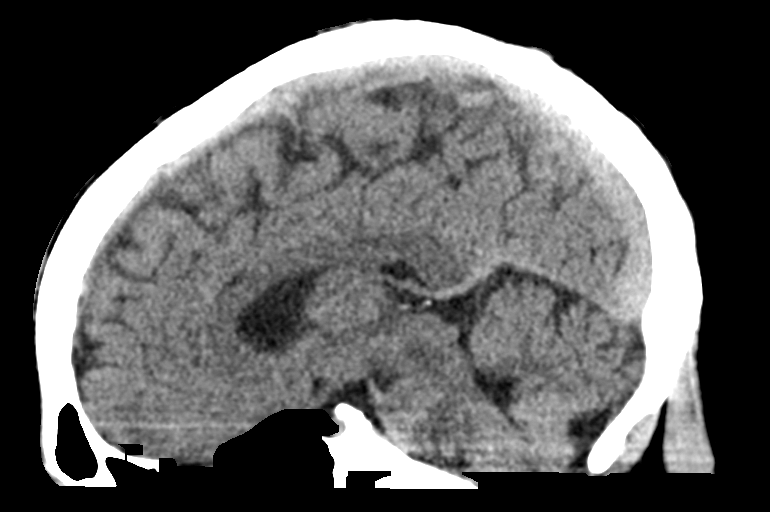
[im 35/52  brain]
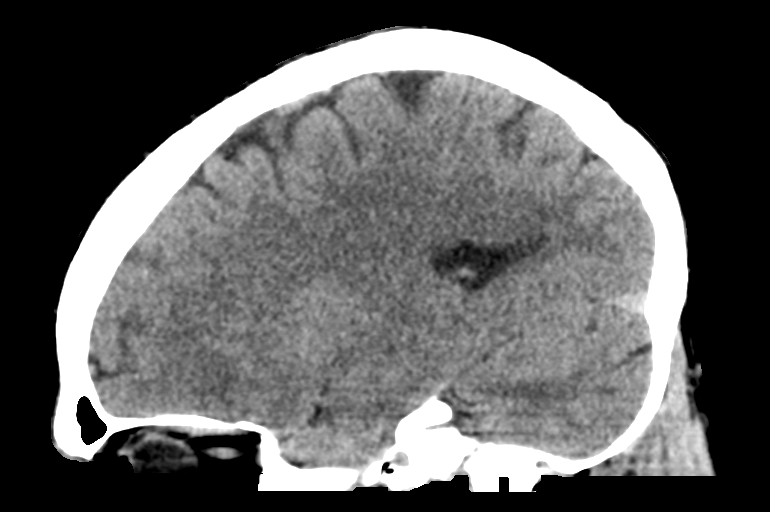

[15 of 45 positions shown; findings below may reference images not displayed]

FINDINGS: Brain: The ventricles and sulci appropriate size for patient's age.
The gray-white matter discrimination is preserved. There is no acute
intracranial hemorrhage. No mass effect or midline shift. No
extra-axial fluid collection.

Vascular: No hyperdense vessel or unexpected calcification.

Skull: Normal. Negative for fracture or focal lesion.

Sinuses/Orbits: No acute finding.

Other: None
IMPRESSION: Unremarkable noncontrast CT of the brain.

## 2019-02-12 NOTE — ED Triage Notes (Addendum)
Pt states he was watching tv with he felt a tightness and fluttering to left chest and moved across chest. States left arm feels "cold", but denies any cardiac history. Denies any recent illness, but has had bipolar meds adjusted recently. Pt did have 4-81 mg asa prior to coming in.

## 2019-02-12 NOTE — ED Provider Notes (Signed)
[facility] Emergency Department Provider Note  ____________________________________________  Time seen: Approximately 11:40 PM  I have reviewed the triage vital signs and the nursing notes.   HISTORY  Chief Complaint Chest Pain   HPI Darrell Kennedy is a 38 y.o. male with a history of HIV (CD4 563, viral load 50 in 08/2018), HLD, bipolar disorder who presents for evaluation of chest pain.  Patient reports that he had had dinner and was watching a movie with his mother when he started having a fluttering sensation in his chest.  He became clammy and dizzy, developed chest tightness.  He felt the left arm go numb followed by the left leg feeling numb.  While in the ambulance he started having difficulty finding words.  The whole episode lasted 30 to 40 minutes.  He feels back to normal at this time.  He had some tunnel vision associated with it.  He denies headache.  Currently has no chest pain or any weakness or numbness.  He took 2 aspirins at home and given tomorrow by EMS in route.  He denies any family history of stroke.  Does report family history of heart disease in his grandfather.  He endorses compliance with his HIV medication.  He has had no fever or cough or any other symptoms of COVID-19.   He reports recently having his dose of quetiapine increased and was concerned this could be a side effect of it.  Past Medical History:  Diagnosis Date   Heart murmur    HIV (human immunodeficiency virus infection) (HCC)    HLD (hyperlipidemia)     Patient Active Problem List   Diagnosis Date Noted   Bipolar I disorder, most recent episode (or current) manic (HCC) 02/06/2019   At risk for long QT syndrome 02/06/2019   Mood disorder (HCC) 11/25/2018   HIV infection (HCC) 10/07/2018    No past surgical history on file.  Prior to Admission medications   Medication Sig Start Date End Date Taking? Authorizing Provider  benztropine (COGENTIN) 0.5 MG tablet  Take 1 tablet (0.5 mg total) by mouth at bedtime as needed. Side effects of seroquel 02/06/19  Yes Eappen, Levin Bacon, MD  bictegravir-emtricitabine-tenofovir AF (BIKTARVY) 50-200-25 MG TABS tablet Take 1 tablet by mouth daily. 10/28/18  Yes Lynn Ito, MD  QUEtiapine (SEROQUEL) 100 MG tablet Take 1 tablet (100 mg total) by mouth at bedtime. 02/06/19  Yes Jomarie Longs, MD  rosuvastatin (CRESTOR) 10 MG tablet Take 1 tablet (10 mg total) by mouth daily. 11/27/18  Yes Doren Custard, FNP  tiZANidine (ZANAFLEX) 4 MG tablet Take 4 mg by mouth at bedtime as needed. 01/28/19   [provider]    Allergies Patient has no known allergies.  Family History  Problem Relation Age of Onset   Hypertension Father    Diabetes Father    Kidney disease Father    Depression Father    Hyperlipidemia Father    Alcohol abuse Brother    Heart disease Paternal Grandmother    Drug abuse Cousin     Social History Social History   Tobacco Use   Smoking status: Never Smoker   Smokeless tobacco: Never Used  Substance Use Topics   Alcohol use: Yes    Frequency: Never    Comment: social maybe once a week   Drug use: Never    Review of Systems  Constitutional: Negative for fever. + dizziness Eyes: Negative for visual changes. ENT: Negative for sore throat. Neck: No neck  pain  Cardiovascular: + chest tightness and fluttering Respiratory: Negative for shortness of breath. Gastrointestinal: Negative for abdominal pain, vomiting or diarrhea. Genitourinary: Negative for dysuria. Musculoskeletal: Negative for back pain. Skin: Negative for rash. Neurological: Negative for headaches. + L sided numbness, difficulty finding words Psych: No SI or HI  ____________________________________________   PHYSICAL EXAM:  VITAL SIGNS: ED Triage Vitals  Enc Vitals Group     BP 02/12/19 2128 129/69     Pulse Rate 02/12/19 2127 94     Resp 02/12/19 2128 20     Temp 02/12/19 2128  98.4 F (36.9 C)     Temp Source 02/12/19 2128 Oral     SpO2 02/12/19 2128 99 %     Weight 02/12/19 2126 158 lb (71.7 kg)     Height 02/12/19 2126 5\' 8"  (1.727 m)     Head Circumference --      Peak Flow --      Pain Score 02/12/19 2129 4     Pain Loc --      Pain Edu? --      Excl. in [place]? --     Constitutional: Alert and oriented. Well appearing and in no apparent distress. HEENT:      Head: Normocephalic and atraumatic.         Eyes: Conjunctivae are normal. Sclera is non-icteric.       Mouth/Throat: Mucous membranes are moist.       Neck: Supple with no signs of meningismus. Cardiovascular: Regular rate and rhythm. No murmurs, gallops, or rubs. 2+ symmetrical distal pulses are present in all extremities. No JVD. Respiratory: Normal respiratory effort. Lungs are clear to auscultation bilaterally. No wheezes, crackles, or rhonchi.  Gastrointestinal: Soft, non tender, and non distended with positive bowel sounds. No rebound or guarding. Genitourinary: No CVA tenderness. Musculoskeletal: Nontender with normal range of motion in all extremities. No edema, cyanosis, or erythema of extremities. Neurologic: Normal speech and language. A & O x3, PERRL, EOMI, no nystagmus, CN II-XII intact, motor testing reveals good tone and bulk throughout. There is no evidence of pronator drift or dysmetria. Muscle strength is 5/5 throughout. Deep tendon reflexes are 2+ throughout with downgoing toes. Sensory examination is intact. Gait is normal. Skin: Skin is warm, dry and intact. No rash noted. Psychiatric: Mood and affect are normal. Speech and behavior are normal.  ____________________________________________   LABS (all labs ordered are listed, but only abnormal results are displayed)  Labs Reviewed  CBC WITH DIFFERENTIAL/PLATELET - Abnormal; Notable for the following components:      Result Value   Lymphs Abs 4.2 (*)    All other components within normal limits  COMPREHENSIVE METABOLIC PANEL  - Abnormal; Notable for the following components:   Glucose, Bld 118 (*)    Creatinine, Ser 1.39 (*)    AST 43 (*)    All other components within normal limits  CK - Abnormal; Notable for the following components:   Total CK 926 (*)    All other components within normal limits  SARS CORONAVIRUS 2 (TAT 6-24 HRS)  TROPONIN I (HIGH SENSITIVITY)  TROPONIN I (HIGH SENSITIVITY)   ____________________________________________  EKG  ED ECG REPORT I, Rudene Re, the attending physician, personally viewed and interpreted this ECG.  Normal sinus rhythm, rate of 92, normal intervals, normal axis, no ST elevations or depressions.  Normal EKG. ____________________________________________  RADIOLOGY  I have personally reviewed the images performed during this visit and I agree with the Radiologist's read.  Interpretation by Radiologist:  Dg Chest 2 View  Result Date: 02/12/2019 CLINICAL DATA:  Chest pain EXAM: CHEST - 2 VIEW COMPARISON:  None. FINDINGS: The heart size and mediastinal contours are within normal limits. Both lungs are clear. The visualized skeletal structures are unremarkable. IMPRESSION: No active cardiopulmonary disease. Electronically Signed   By: Jasmine PangKim  Fujinaga M.D.   On: 02/12/2019 21:55   Ct Head Wo Contrast  Result Date: 02/12/2019 CLINICAL DATA:  38 year old male with left chest tightness and fluttering and left arm coldness. EXAM: CT HEAD WITHOUT CONTRAST TECHNIQUE: Contiguous axial images were obtained from the base of the skull through the vertex without intravenous contrast. COMPARISON:  None. FINDINGS: Brain: The ventricles and sulci appropriate size for patient's age. The gray-white matter discrimination is preserved. There is no acute intracranial hemorrhage. No mass effect or midline shift. No extra-axial fluid collection. Vascular: No hyperdense vessel or unexpected calcification. Skull: Normal. Negative for fracture or focal lesion. Sinuses/Orbits: No acute  finding. Other: None IMPRESSION: Unremarkable noncontrast CT of the brain. Electronically Signed   By: Elgie CollardArash  Radparvar M.D.   On: 02/12/2019 23:48   Mr Brain Wo Contrast  Result Date: 02/13/2019 CLINICAL DATA:  Left arm paresthesia EXAM: MRI HEAD WITHOUT CONTRAST TECHNIQUE: Multiplanar, multiecho pulse sequences of the brain and surrounding structures were obtained without intravenous contrast. COMPARISON:  None. FINDINGS: BRAIN: There is no acute infarct, acute hemorrhage or extra-axial collection. The white matter signal is normal for the patient's age. The cerebral and cerebellar volume are age-appropriate. There is no hydrocephalus. The midline structures are normal. VASCULAR: The major intracranial arterial and venous sinus flow voids are normal. 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 SKULL AND UPPER CERVICAL SPINE: Calvarial bone marrow signal is normal. There is no skull base mass. The visualized upper cervical spine and soft tissues are normal. SINUSES/ORBITS: There are no fluid levels or advanced mucosal thickening. The mastoid air cells and middle ear cavities are free of fluid. The orbits are normal. IMPRESSION: Normal MRI of the brain. Electronically Signed   By: Deatra RobinsonKevin  Herman M.D.   On: 02/13/2019 02:21     ____________________________________________   PROCEDURES  Procedure(s) performed: None Procedures Critical Care performed:  None ____________________________________________   INITIAL IMPRESSION / ASSESSMENT AND PLAN / ED COURSE   38 y.o. male with a history of HIV (CD4 563, viral load 50 in 08/2018), HLD, bipolar disorder who presents for evaluation of chest tightness, fluttering, dizziness, L sided numbness, and difficulty finding words. Now back to baseline. Neuro intact. EKG with no evidence of dysrhythmias or ischemia.  Ddx TIA vs CVA vs ACS vs anxiety attack vs side effect of medication vs  dysrhythmia  Initial troponin is negative.  CBC and CMP with no acute findings.  Second troponin is pending.  CT head to rule out stroke pending.  Anticipate admission for TIA work-up.     _________________________ 4:53 AM on 02/13/2019 -----------------------------------------  CT head and MRI negative. Will admit for TIA work up.    As part of my medical decision making, I reviewed the following data within the electronic MEDICAL RECORD NUMBER Nursing notes reviewed and incorporated, Labs reviewed , EKG interpreted , Old chart reviewed, Radiograph reviewed , Discussed with admitting physician , Notes from prior ED visits and [place] Controlled Substance Database   Patient was evaluated in Emergency Department today for the symptoms described in the history of present illness. Patient was evaluated in the context  of the global COVID-19 pandemic, which necessitated consideration that the patient might be at risk for infection with the SARS-CoV-2 virus that causes COVID-19. Institutional protocols and algorithms that pertain to the evaluation of patients at risk for COVID-19 are in a state of rapid change based on information released by regulatory bodies including the CDC and federal and state organizations. These policies and algorithms were followed during the patient's care in the ED.   ____________________________________________   FINAL CLINICAL IMPRESSION(S) / ED DIAGNOSES   Final diagnoses:  TIA (transient ischemic attack)      NEW MEDICATIONS STARTED DURING THIS VISIT:  ED Discharge Orders    None       Note:  This document was prepared using Dragon voice recognition software and may include unintentional dictation errors.    Nita Sickle, MD 02/13/19 [phone]

## 2019-02-13 ENCOUNTER — Other Ambulatory Visit: Payer: Self-pay | Admitting: Family Medicine

## 2019-02-13 ENCOUNTER — Observation Stay: Payer: PRIVATE HEALTH INSURANCE

## 2019-02-13 ENCOUNTER — Emergency Department: Payer: PRIVATE HEALTH INSURANCE

## 2019-02-13 ENCOUNTER — Observation Stay
Admit: 2019-02-13 | Discharge: 2019-02-13 | Disposition: A | Discharge status: Home / Self Care | Payer: PRIVATE HEALTH INSURANCE | Attending: Internal Medicine | Admitting: Internal Medicine

## 2019-02-13 DIAGNOSIS — G459 Transient cerebral ischemic attack, unspecified: Secondary | ICD-10-CM | POA: Diagnosis not present

## 2019-02-13 DIAGNOSIS — F39 Unspecified mood [affective] disorder: Secondary | ICD-10-CM

## 2019-02-13 DIAGNOSIS — G4700 Insomnia, unspecified: Secondary | ICD-10-CM

## 2019-02-13 LAB — LIPID PANEL
Cholesterol: 157 mg/dL (ref 0–200)
HDL: 52 mg/dL (ref >40)
LDL Cholesterol: 78 mg/dL (ref 0–99)
Total CHOL/HDL Ratio: 3 RATIO
Triglycerides: 135 mg/dL (ref <150)
VLDL: 27 mg/dL (ref 0–40)

## 2019-02-13 LAB — SARS CORONAVIRUS 2 (TAT 6-24 HRS): SARS Coronavirus 2: NEGATIVE

## 2019-02-13 LAB — ECHOCARDIOGRAM COMPLETE
Height: 68 in
Weight: 2528 oz

## 2019-02-13 LAB — CK: Total CK: 926 U/L — ABNORMAL HIGH (ref 49–397)

## 2019-02-13 LAB — HEMOGLOBIN A1C
Hgb A1c MFr Bld: 5.6 % (ref 4.8–5.6)
Mean Plasma Glucose: 114.02 mg/dL

## 2019-02-13 LAB — TROPONIN I (HIGH SENSITIVITY): Troponin I (High Sensitivity): 4 ng/L (ref <18)

## 2019-02-13 LAB — TSH: TSH: 1.78 mIU/L (ref 0.40–4.50)

## 2019-02-13 IMAGING — MR MR HEAD W/O CM
13 series · 48 of 48 positions shown · non-contrast
Comparison: None.

CLINICAL DATA: Left arm paresthesia

EXAM:
MRI HEAD WITHOUT CONTRAST
TECHNIQUE: Multiplanar, multiecho pulse sequences of the brain and surrounding
structures were obtained without intravenous contrast.

[Series 2: cor dwi_tracew · coronal · 5.0mm · 1.31mm/px · 7 of 75 slices shown]
[im 1/75]
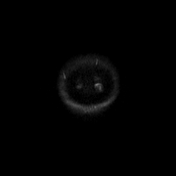
[im 13/75]
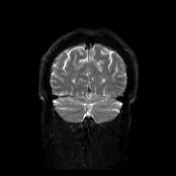
[im 25/75]
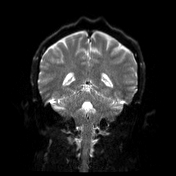
[im 38/75]
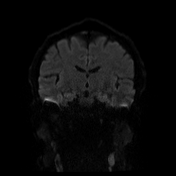
[im 50/75]
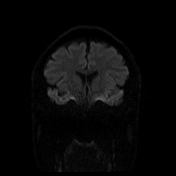
[im 62/75]
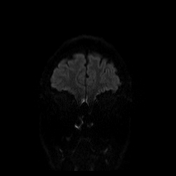
[im 75/75]
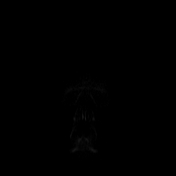

[Series 3: cor dwi_adc · coronal · 5.0mm · 1.31mm/px · 3 of 38 slices shown]
[im 1/38]
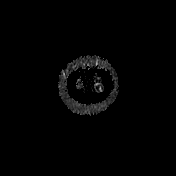
[im 19/38]
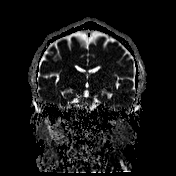
[im 38/38]
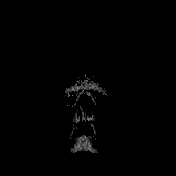

[Series 4: ax dwi_tracew · axial · 3.0mm · 0.60mm/px · z∈[-62,+93]mm · 7 of 95 slices shown]
[im 1/95]
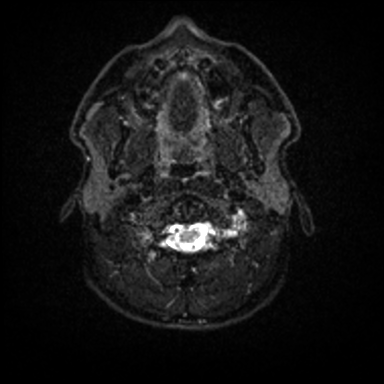
[im 16/95]
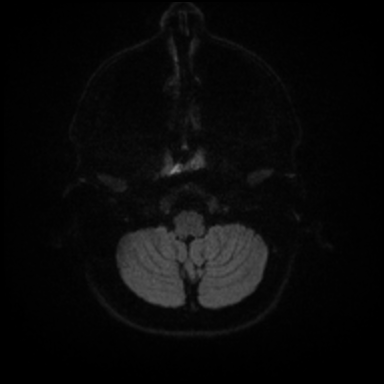
[im 32/95]
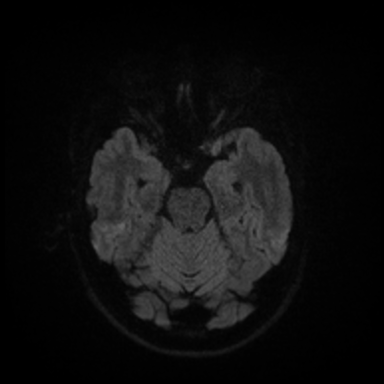
[im 48/95]
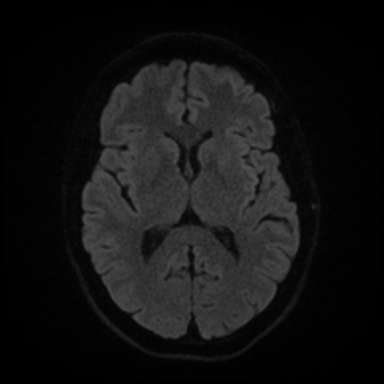
[im 63/95]
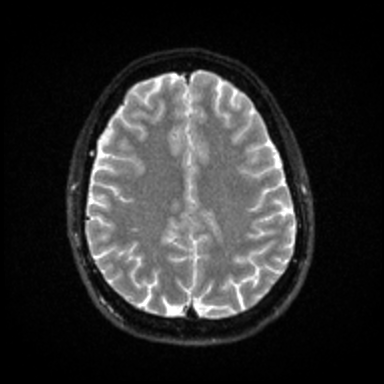
[im 79/95]
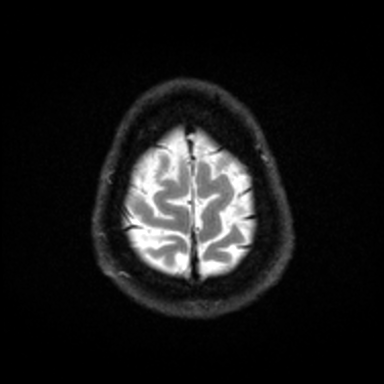
[im 95/95]
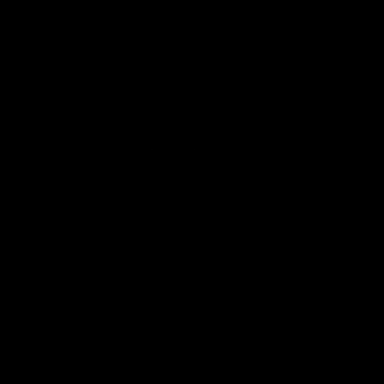

[Series 5: ax dwi_adc · axial · 3.0mm · 0.60mm/px · z∈[-62,+90]mm · 3 of 47 slices shown]
[im 1/47]
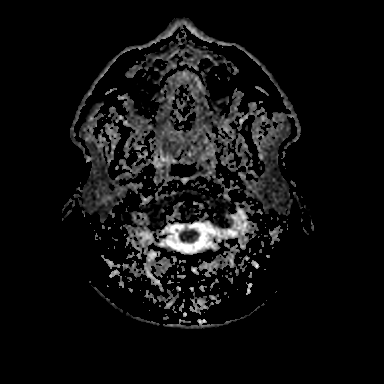
[im 24/47]
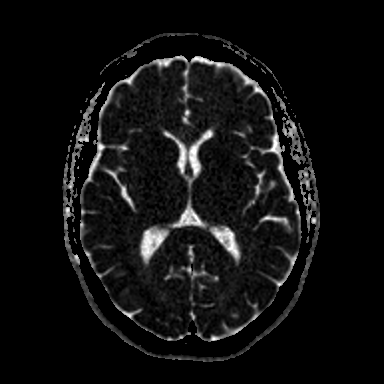
[im 47/47]
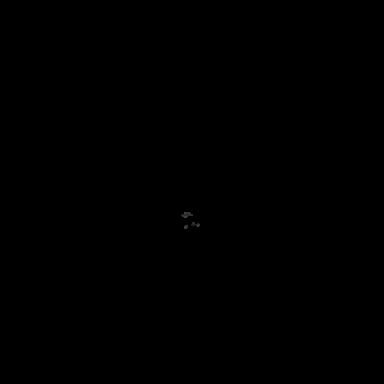

[Series 6: T1 · sagittal · 5.0mm · 0.94mm/px · 2 of 23 slices shown (1 of 2)]
[im 1/23]
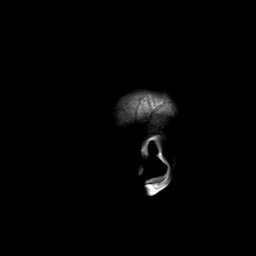
[im 23/23]
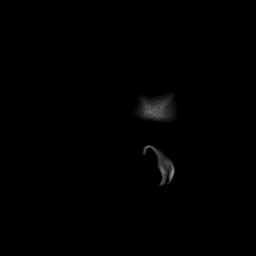

[Series 7: T2 · axial · 5.0mm · 0.53mm/px · z∈[-48,+96]mm · 2 of 25 slices shown (1 of 2)]
[im 1/25]
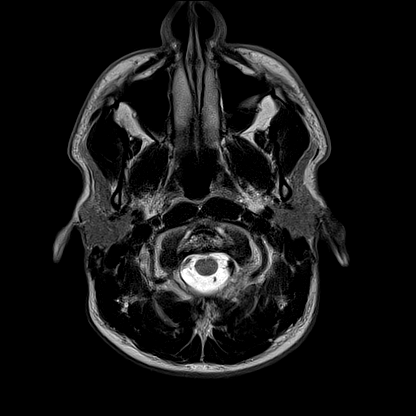
[im 25/25]
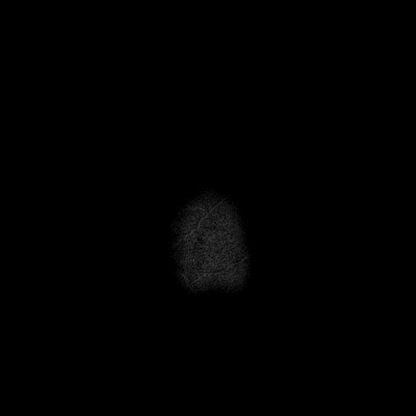

[Series 8: mag_images · axial · 3.0mm · 0.90mm/px · z∈[-64,+113]mm · 4 of 60 slices shown]
[im 1/60]
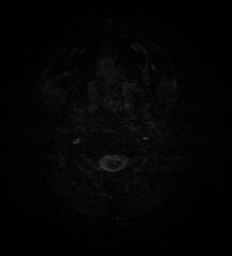
[im 20/60]
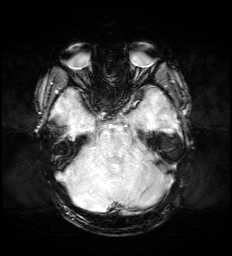
[im 40/60]
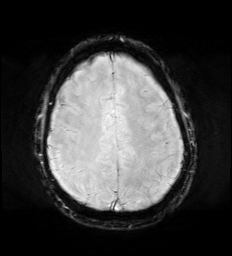
[im 60/60]
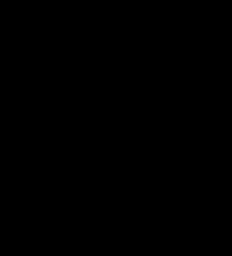

[Series 9: pha_images · axial · 3.0mm · 0.90mm/px · z∈[-64,+110]mm · 4 of 59 slices shown]
[im 1/59]
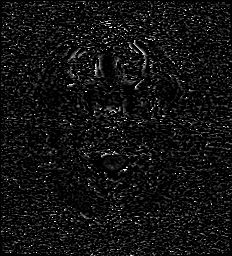
[im 20/59]
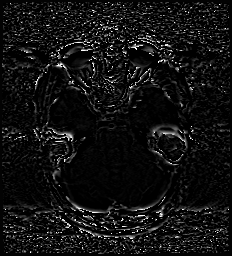
[im 39/59]
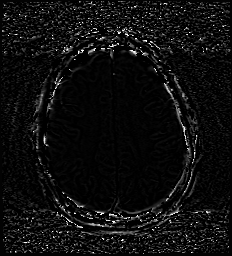
[im 59/59]
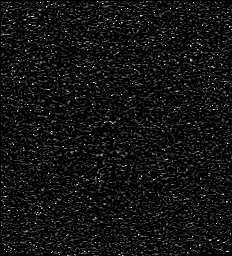

[Series 10: swi_images · axial · 3.0mm · 0.90mm/px · z∈[-64,+113]mm · 4 of 60 slices shown]
[im 1/60]
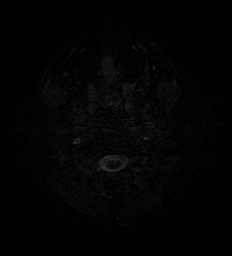
[im 20/60]
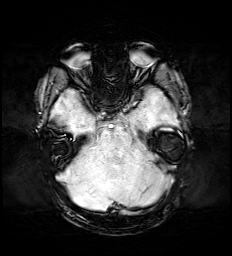
[im 40/60]
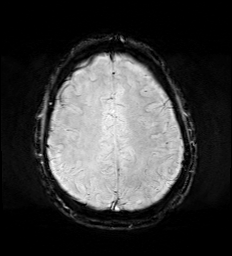
[im 60/60]
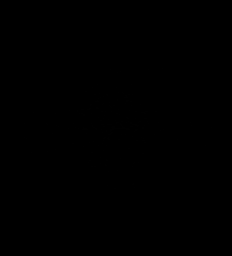

[Series 11: mip_images(sw) · axial · 24.0mm · 0.90mm/px · z∈[-53,+102]mm · 4 of 53 slices shown]
[im 1/53]
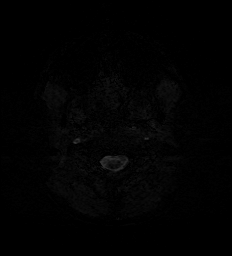
[im 18/53]
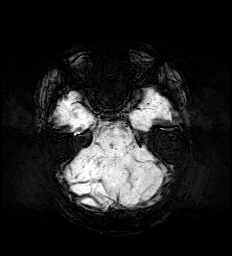
[im 35/53]
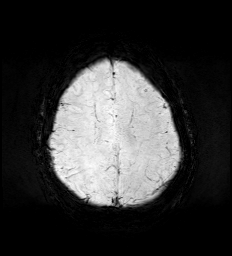
[im 53/53]
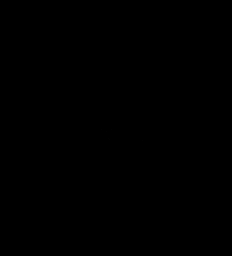

[Series 12: FLAIR · axial · 3.0mm · 0.53mm/px · z∈[-57,+105]mm · 4 of 55 slices shown]
[im 1/55]
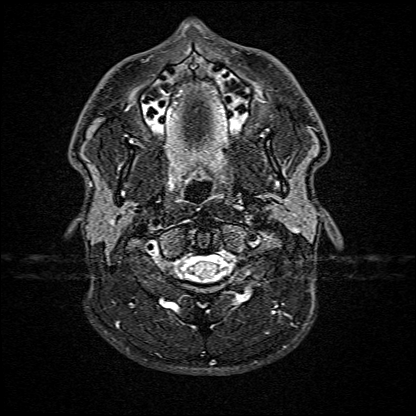
[im 19/55]
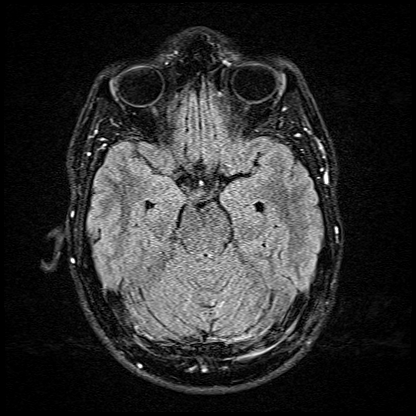
[im 37/55]
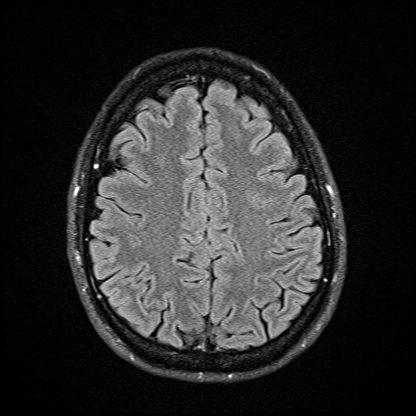
[im 55/55]
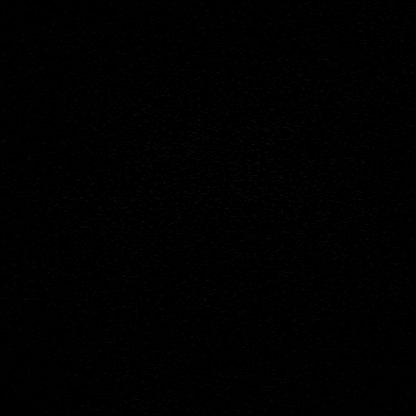

[Series 13: T1 · axial · 5.0mm · 0.90mm/px · z∈[-61,+95]mm · 2 of 27 slices shown (2 of 2)]
[im 1/27]
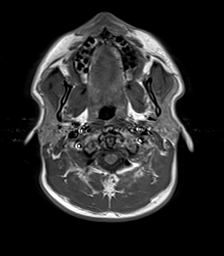
[im 27/27]
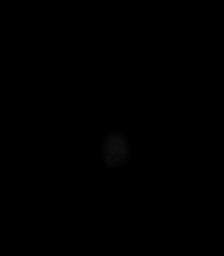

[Series 14: T2 · coronal · 5.0mm · 0.45mm/px · 2 of 31 slices shown (2 of 2)]
[im 1/31]
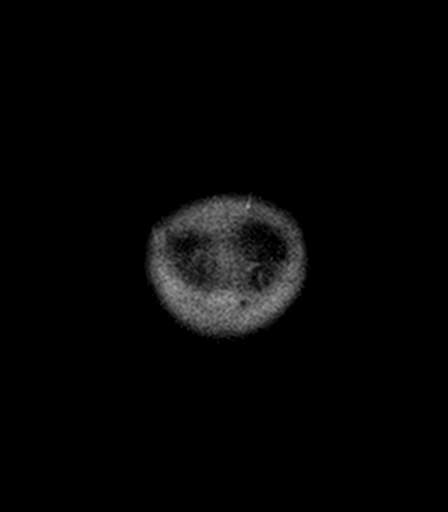
[im 31/31]
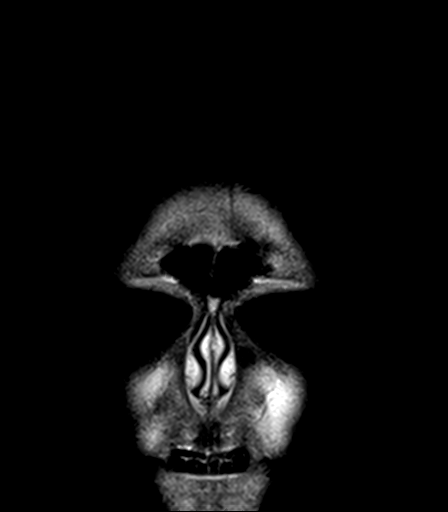

[48 of 48 positions shown; findings below may reference images not displayed]

FINDINGS: BRAIN: There is no acute infarct, acute hemorrhage or extra-axial
collection. The white matter signal is normal for the patient's age.
The cerebral and cerebellar volume are age-appropriate. There is no
hydrocephalus. The midline structures are normal.

VASCULAR: The major intracranial arterial and venous sinus flow
voids are normal. There is a single focus of magnetic susceptibility
effect within the pons, likely capillary telangiectasia.

SKULL AND UPPER CERVICAL SPINE: Calvarial bone marrow signal is
normal. There is no skull base mass. The visualized upper cervical
spine and soft tissues are normal.

SINUSES/ORBITS: There are no fluid levels or advanced mucosal
thickening. The mastoid air cells and middle ear cavities are free
of fluid. The orbits are normal.
IMPRESSION: Normal MRI of the brain.

## 2019-02-13 IMAGING — US US CAROTID DUPLEX BILAT
1 series · 13 of 24 positions shown · non-contrast
Comparison: None.

CLINICAL DATA: 38-year-old male with a history of no given history

EXAM:
BILATERAL CAROTID DUPLEX ULTRASOUND
TECHNIQUE: Gray scale imaging, color Doppler and duplex ultrasound were
performed of bilateral carotid and vertebral arteries in the neck.

[Series 1: us carotid duplex bilat · 13 of 66 slices shown]
[im 1/66]
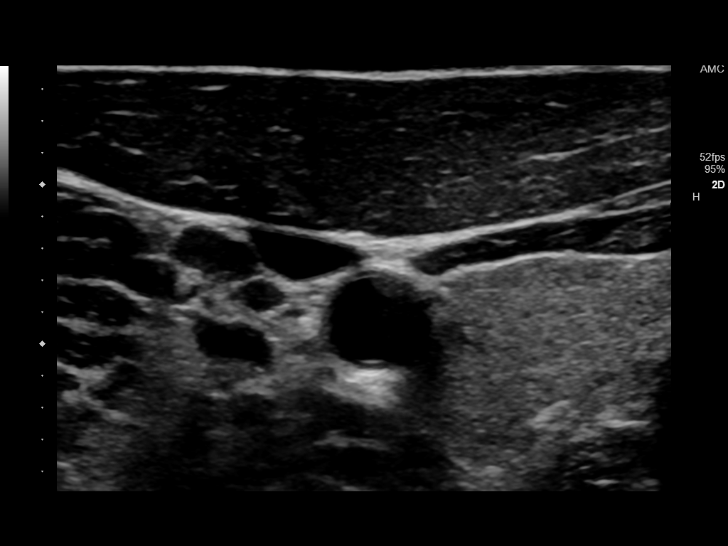
[im 6/66]
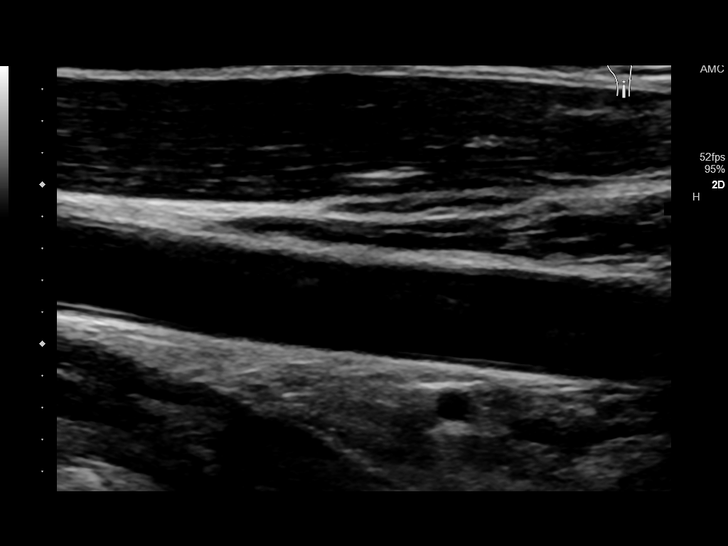
[im 12/66]
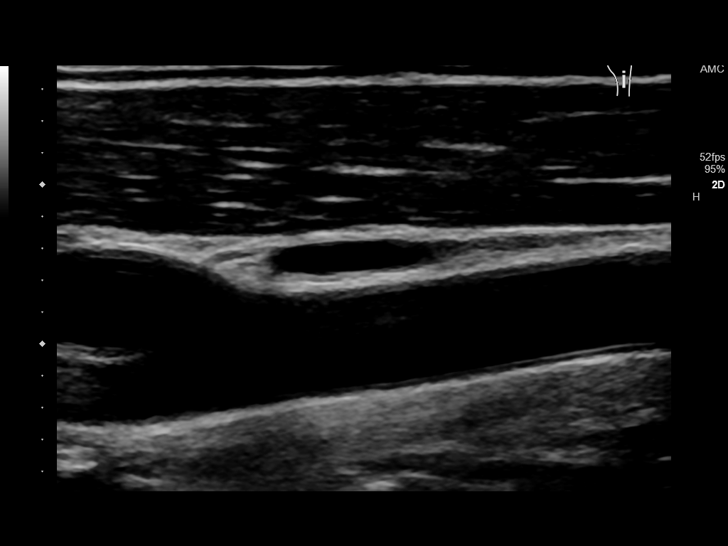
[im 17/66]
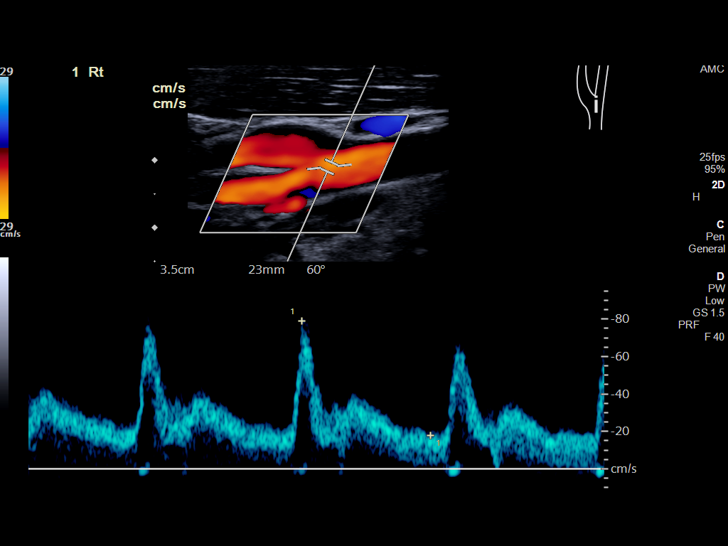
[im 23/66]
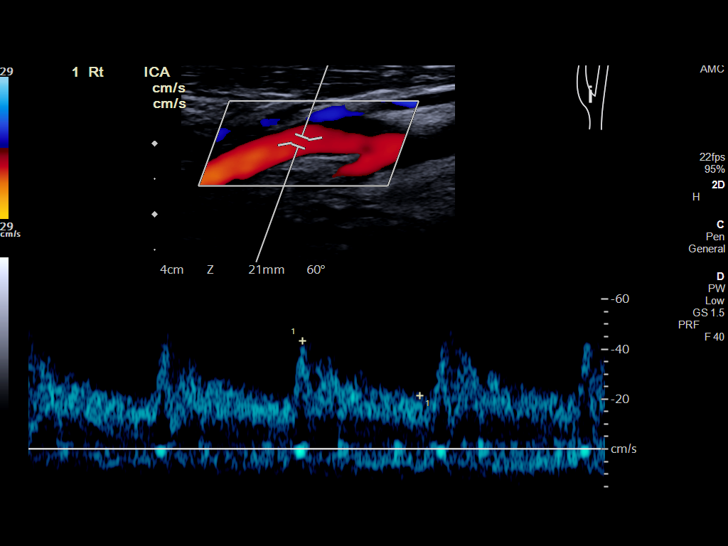
[im 29/66]
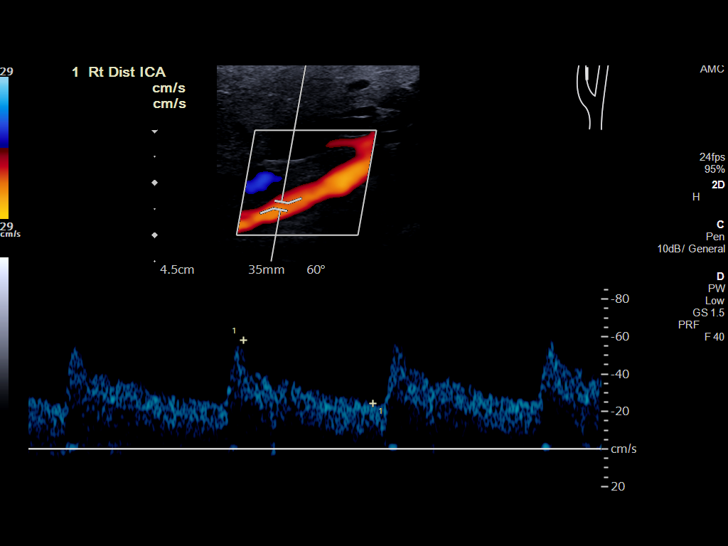
[im 34/66]
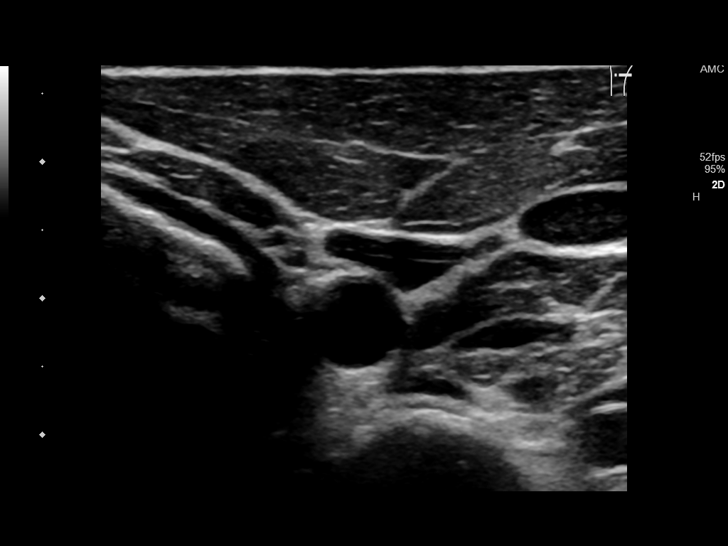
[im 37/66]
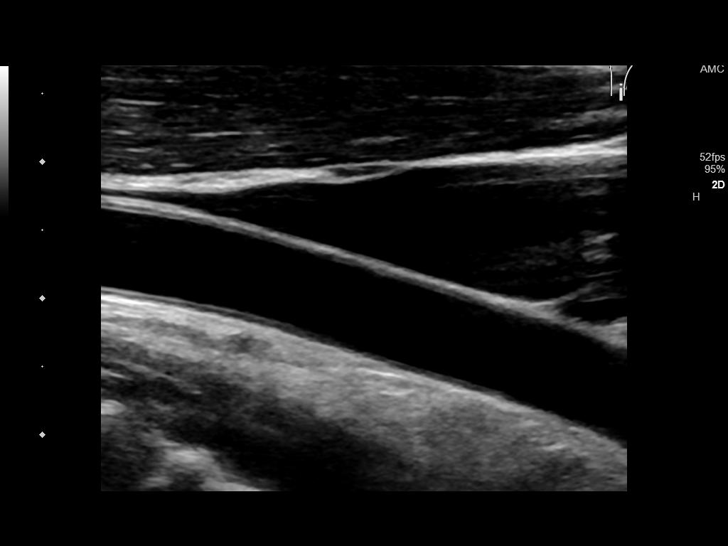
[im 43/66]
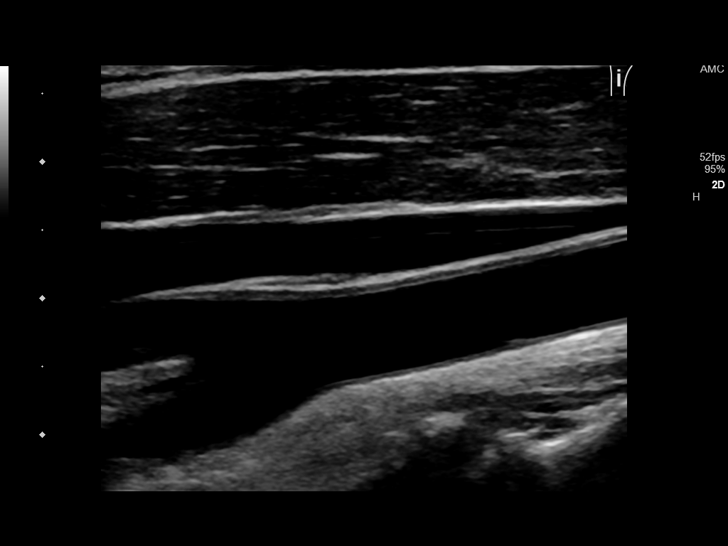
[im 49/66]
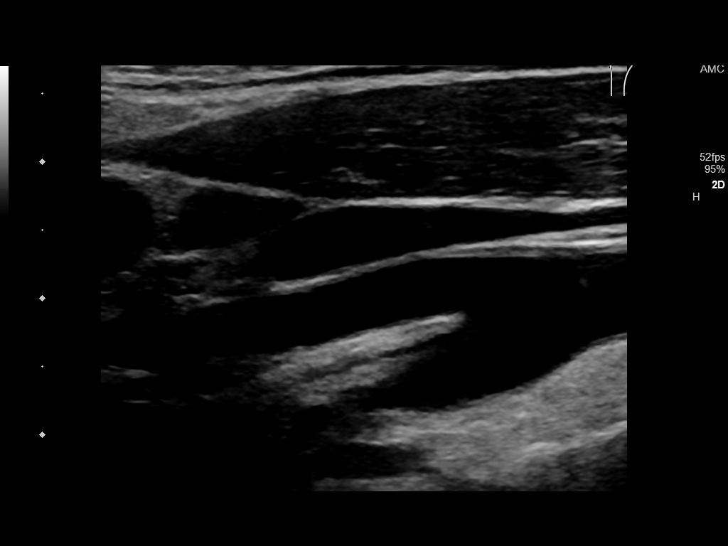
[im 54/66]
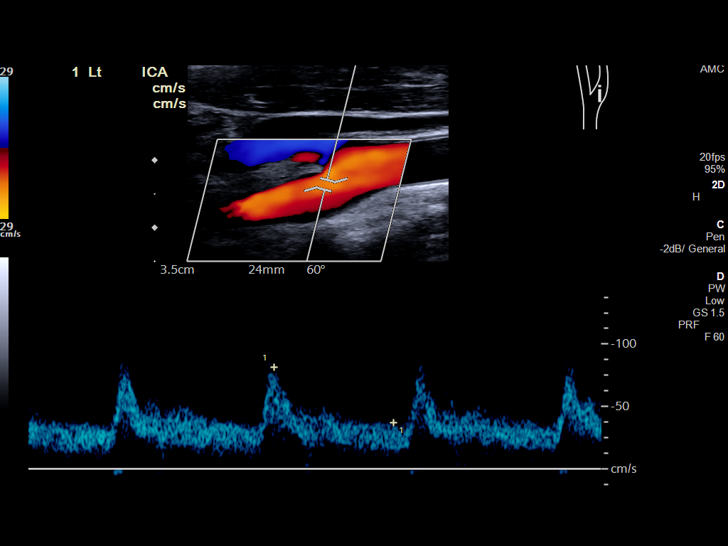
[im 60/66]
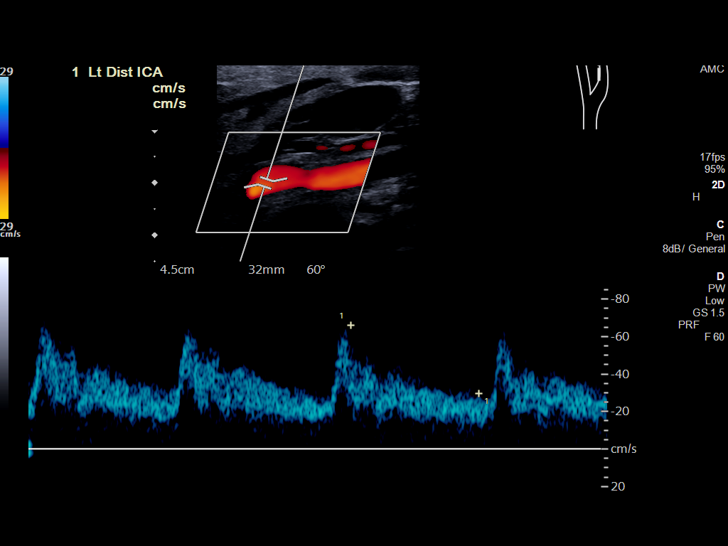
[im 66/66]
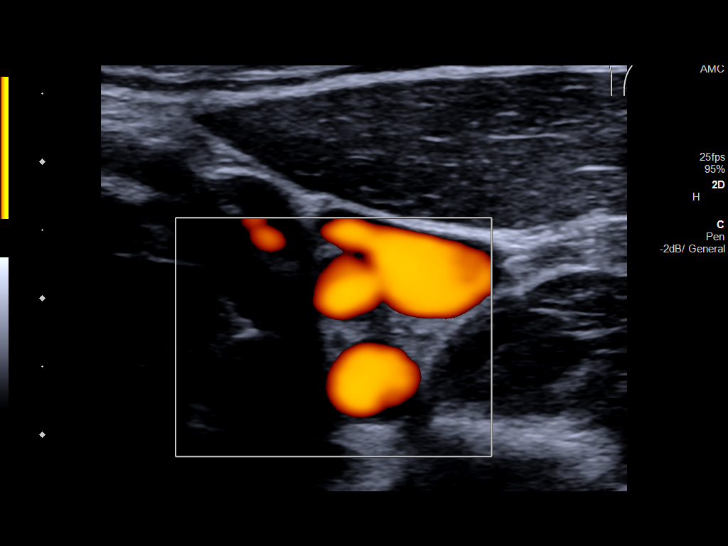

[13 of 24 positions shown; findings below may reference images not displayed]

FINDINGS: Criteria: Quantification of carotid stenosis is based on velocity
parameters that correlate the residual internal carotid diameter
with NASCET-based stenosis levels, using the diameter of the distal
internal carotid lumen as the denominator for stenosis measurement.

The following velocity measurements were obtained:

RIGHT

ICA:  Systolic 59 cm/sec, Diastolic 29 cm/sec

CCA:  109 cm/sec

SYSTOLIC ICA/CCA RATIO:

ECA:  81 cm/sec

LEFT

ICA:  Systolic 81 cm/sec, Diastolic 37 cm/sec

CCA:  101 cm/sec

SYSTOLIC ICA/CCA RATIO:

ECA:  87 cm/sec

Right Brachial SBP: Not acquired

Left Brachial SBP: Not acquired

RIGHT CAROTID ARTERY: Unremarkable appearance of the carotid system
without significant atherosclerotic disease. Intermediate waveform
of the CCA. Low resistance waveform of the ICA. No significant
tortuosity of the carotid system.

RIGHT VERTEBRAL ARTERY: Antegrade flow with low resistance waveform.

LEFT CAROTID ARTERY: Unremarkable appearance of the carotid system
without significant atherosclerotic disease. Intermediate waveform
of the CCA. Low resistance waveform of the ICA. No significant
tortuosity of the carotid system.

LEFT VERTEBRAL ARTERY:  Antegrade flow with low resistance waveform.
IMPRESSION: Color duplex indicates no significant plaque, with no
hemodynamically significant stenosis by duplex criteria in the
extracranial cerebrovascular circulation.

## 2019-02-13 MED ORDER — ASPIRIN 81 MG PO CHEW: 81.0000 mg | CHEWABLE_TABLET | Freq: Every day | ORAL | 0 refills | Status: DC

## 2019-02-13 MED ORDER — BICTEGRAVIR-EMTRICITAB-TENOFOV 50-200-25 MG PO TABS
1.0000 | ORAL_TABLET | Freq: Every day | ORAL | Status: DC
  Filled 2019-02-13: qty 1

## 2019-02-13 MED ORDER — TIZANIDINE HCL 4 MG PO TABS
4.0000 mg | ORAL_TABLET | Freq: Every evening | ORAL | Status: DC | PRN
  Filled 2019-02-13: qty 1

## 2019-02-13 MED ORDER — QUETIAPINE FUMARATE 25 MG PO TABS: 100.0000 mg | ORAL_TABLET | Freq: Every day | ORAL | Status: DC

## 2019-02-13 MED ORDER — BENZTROPINE MESYLATE 1 MG PO TABS: 0.5000 mg | ORAL_TABLET | Freq: Every evening | ORAL | Status: DC | PRN

## 2019-02-13 MED ORDER — ACETAMINOPHEN 160 MG/5ML PO SOLN
650.0000 mg | ORAL | Status: DC | PRN
  Filled 2019-02-13: qty 20.3

## 2019-02-13 MED ORDER — STROKE: EARLY STAGES OF RECOVERY BOOK
Freq: Once | Status: AC
  Administered 2019-02-13: 14:00:00
  Filled 2019-02-13: qty 1

## 2019-02-13 MED ORDER — ACETAMINOPHEN 650 MG RE SUPP: 650.0000 mg | RECTAL | Status: DC | PRN

## 2019-02-13 MED ORDER — ACETAMINOPHEN 325 MG PO TABS: 650.0000 mg | ORAL_TABLET | ORAL | Status: DC | PRN

## 2019-02-13 MED ORDER — ENOXAPARIN SODIUM 40 MG/0.4ML ~~LOC~~ SOLN
40.0000 mg | SUBCUTANEOUS | Status: DC
  Filled 2019-02-13: qty 0.4

## 2019-02-13 MED ORDER — ENOXAPARIN SODIUM 40 MG/0.4ML ~~LOC~~ SOLN
40.0000 mg | SUBCUTANEOUS | Status: DC
  Administered 2019-02-13: 40 mg via SUBCUTANEOUS

## 2019-02-13 MED ORDER — ROSUVASTATIN CALCIUM 5 MG PO TABS
10.0000 mg | ORAL_TABLET | Freq: Every day | ORAL | Status: DC
  Filled 2019-02-13 (×3): qty 1

## 2019-02-13 NOTE — Progress Notes (Signed)
*  PRELIMINARY RESULTS* Echocardiogram 2D Echocardiogram has been performed.  Darrell Kennedy 02/13/2019, 10:57 AM

## 2019-02-13 NOTE — Discharge Summary (Signed)
[facility] NAME: Darrell Kennedy    MR#:  [id number]  DATE OF BIRTH:  May 24, 1980  DATE OF ADMISSION:  02/12/2019 ADMITTING PHYSICIAN: Harrie Foreman, MD  DATE OF DISCHARGE: 02/13/2019  PRIMARY CARE PHYSICIAN: Hubbard Hartshorn, FNP    ADMISSION DIAGNOSIS:  EMS Chest Pain  DISCHARGE DIAGNOSIS:  Active Problems: Transient left-sided weakness  SECONDARY DIAGNOSIS:   Past Medical History:  Diagnosis Date  . Heart murmur   . HIV (human immunodeficiency virus infection) ([place])   . HLD (hyperlipidemia)     HOSPITAL COURSE:   38 year old male with HIV who presented to the ER due to palpitations and transient left-sided weakness.  1.  Transient left-sided weakness: Patient was ruled out for CVA with negative MRI.  Echocardiogram and carotid Doppler were essentially unremarkable.  Patient was eval by neurology.  Recommendations are for baby aspirin and outpatient follow-up with neurology.  2.  HIV: Patient will continue his outpatient regimen with follow-up with Dr. Levester Fresh  3.  Bipolar 2 disorder: As per neurology patient was taken off of his Cogentin.  He will follow-up with his psychiatrist Dr. Shea Evans. Continue quetiapine   DISCHARGE CONDITIONS AND DIET:  Stable regular diet   CONSULTS OBTAINED:  Treatment Team:  Catarina Hartshorn, MD  DRUG ALLERGIES:  No Known Allergies  DISCHARGE MEDICATIONS:   Allergies as of 02/13/2019   No Known Allergies     Medication List    STOP taking these medications   benztropine 0.5 MG tablet Commonly known as: COGENTIN     TAKE these medications   aspirin 81 MG chewable tablet Commonly known as: Aspirin Childrens Chew 1 tablet (81 mg total) by mouth daily.   Biktarvy 50-200-25 MG Tabs tablet Generic drug: bictegravir-emtricitabine-tenofovir AF Take 1 tablet by mouth daily.   QUEtiapine 100 MG tablet Commonly known as: SEROquel Take 1 tablet (100 mg total) by  mouth at bedtime.   rosuvastatin 10 MG tablet Commonly known as: Crestor Take 1 tablet (10 mg total) by mouth daily.   tiZANidine 4 MG tablet Commonly known as: ZANAFLEX Take 4 mg by mouth at bedtime as needed.         Today   CHIEF COMPLAINT:  Doing ok no left sided weakness   VITAL SIGNS:  Blood pressure 114/69, pulse 66, temperature 98.4 F (36.9 C), temperature source Oral, resp. rate 12, height 5\' 8"  (1.727 m), weight 71.7 kg, SpO2 95 %.   REVIEW OF SYSTEMS:  Review of Systems  Constitutional: Negative.  Negative for chills, fever and malaise/fatigue.  HENT: Negative.  Negative for ear discharge, ear pain, hearing loss, nosebleeds and sore throat.   Eyes: Negative.  Negative for blurred vision and pain.  Respiratory: Negative.  Negative for cough, hemoptysis, shortness of breath and wheezing.   Cardiovascular: Negative.  Negative for chest pain, palpitations and leg swelling.  Gastrointestinal: Negative.  Negative for abdominal pain, blood in stool, diarrhea, nausea and vomiting.  Genitourinary: Negative.  Negative for dysuria.  Musculoskeletal: Negative.  Negative for back pain.  Skin: Negative.   Neurological: Negative for dizziness, tremors, speech change, focal weakness, seizures and headaches.  Endo/Heme/Allergies: Negative.  Does not bruise/bleed easily.  Psychiatric/Behavioral: Negative.  Negative for depression, hallucinations and suicidal ideas.     PHYSICAL EXAMINATION:  GENERAL:  38 y.o.-year-old patient lying in the bed with no acute distress.  NECK:  Supple, no jugular venous distention. No thyroid enlargement, no tenderness.  LUNGS: Normal  breath sounds bilaterally, no wheezing, rales,rhonchi  No use of accessory muscles of respiration.  CARDIOVASCULAR: S1, S2 normal. No murmurs, rubs, or gallops.  ABDOMEN: Soft, non-tender, non-distended. Bowel sounds present. No organomegaly or mass.  EXTREMITIES: No pedal edema, cyanosis, or clubbing.   PSYCHIATRIC: The patient is alert and oriented x 3.  SKIN: No obvious rash, lesion, or ulcer.   DATA REVIEW:   CBC Recent Labs  Lab 02/12/19 2133  WBC 9.1  HGB 14.3  HCT 43.0  PLT 307    Chemistries  Recent Labs  Lab 02/12/19 2133  NA 136  K 3.5  CL 100  CO2 27  GLUCOSE 118*  BUN 18  CREATININE 1.39*  CALCIUM 9.2  AST 43*  ALT 28  ALKPHOS 59  BILITOT 0.6    Cardiac Enzymes No results for input(s): TROPONINI in the last 168 hours.  Microbiology Results  @[email or web address] @  RADIOLOGY:  Dg Chest 2 View  Result Date: 02/12/2019 CLINICAL DATA:  Chest pain EXAM: CHEST - 2 VIEW COMPARISON:  None. FINDINGS: The heart size and mediastinal contours are within normal limits. Both lungs are clear. The visualized skeletal structures are unremarkable. IMPRESSION: No active cardiopulmonary disease. Electronically Signed   By: Jasmine PangKim  Fujinaga M.D.   On: 02/12/2019 21:55   Ct Head Wo Contrast  Result Date: 02/12/2019 CLINICAL DATA:  38 year old male with left chest tightness and fluttering and left arm coldness. EXAM: CT HEAD WITHOUT CONTRAST TECHNIQUE: Contiguous axial images were obtained from the base of the skull through the vertex without intravenous contrast. COMPARISON:  None. FINDINGS: Brain: The ventricles and sulci appropriate size for patient's age. The gray-white matter discrimination is preserved. There is no acute intracranial hemorrhage. No mass effect or midline shift. No extra-axial fluid collection. Vascular: No hyperdense vessel or unexpected calcification. Skull: Normal. Negative for fracture or focal lesion. Sinuses/Orbits: No acute finding. Other: None IMPRESSION: Unremarkable noncontrast CT of the brain. Electronically Signed   By: Elgie CollardArash  Radparvar M.D.   On: 02/12/2019 23:48   Mr Brain Wo Contrast  Result Date: 02/13/2019 CLINICAL DATA:  Left arm paresthesia EXAM: MRI HEAD WITHOUT CONTRAST TECHNIQUE: Multiplanar, multiecho pulse sequences of the brain and  surrounding structures were obtained without intravenous contrast. COMPARISON:  None. FINDINGS: BRAIN: There is no acute infarct, acute hemorrhage or extra-axial collection. The white matter signal is normal for the patient's age. The cerebral and cerebellar volume are age-appropriate. There is no hydrocephalus. The midline structures are normal. VASCULAR: The major intracranial arterial and venous sinus flow voids are normal. 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 SKULL AND UPPER CERVICAL SPINE: Calvarial bone marrow signal is normal. There is no skull base mass. The visualized upper cervical spine and soft tissues are normal. SINUSES/ORBITS: There are no fluid levels or advanced mucosal thickening. The mastoid air cells and middle ear cavities are free of fluid. The orbits are normal. IMPRESSION: Normal MRI of the brain. Electronically Signed   By: Deatra RobinsonKevin  Herman M.D.   On: 02/13/2019 02:21   [place] Carotid Bilateral (at [facility] Only)  Result Date: 02/13/2019 CLINICAL DATA:  38 year old male with a history of no given history EXAM: BILATERAL CAROTID DUPLEX ULTRASOUND TECHNIQUE: Wallace CullensGray scale imaging, color Doppler and duplex ultrasound were performed of bilateral carotid and vertebral arteries in the neck. COMPARISON:  None. FINDINGS: Criteria: Quantification of carotid stenosis is based on velocity parameters that correlate the residual internal carotid diameter with NASCET-based stenosis levels,  using the diameter of the distal internal carotid lumen as the denominator for stenosis measurement. The following velocity measurements were obtained: RIGHT ICA:  Systolic 59 cm/sec, Diastolic 29 cm/sec CCA:  109 cm/sec SYSTOLIC ICA/CCA RATIO:  0.5 ECA:  81 cm/sec LEFT ICA:  Systolic 81 cm/sec, Diastolic 37 cm/sec CCA:  101 cm/sec SYSTOLIC ICA/CCA RATIO:  0.8 ECA:  87 cm/sec Right Brachial SBP: Not acquired Left Brachial SBP: Not acquired RIGHT  CAROTID ARTERY: Unremarkable appearance of the carotid system without significant atherosclerotic disease. Intermediate waveform of the CCA. Low resistance waveform of the ICA. No significant tortuosity of the carotid system. RIGHT VERTEBRAL ARTERY: Antegrade flow with low resistance waveform. LEFT CAROTID ARTERY: Unremarkable appearance of the carotid system without significant atherosclerotic disease. Intermediate waveform of the CCA. Low resistance waveform of the ICA. No significant tortuosity of the carotid system. LEFT VERTEBRAL ARTERY:  Antegrade flow with low resistance waveform. IMPRESSION: Color duplex indicates no significant plaque, with no hemodynamically significant stenosis by duplex criteria in the extracranial cerebrovascular circulation. Signed, Yvone Neu. Reyne Dumas, RPVI [facility] Radiology Electronically Signed   By: Gilmer Mor D.O.   On: 02/13/2019 08:33      Allergies as of 02/13/2019   No Known Allergies     Medication List    STOP taking these medications   benztropine 0.5 MG tablet Commonly known as: COGENTIN     TAKE these medications   aspirin 81 MG chewable tablet Commonly known as: Aspirin Childrens Chew 1 tablet (81 mg total) by mouth daily.   Biktarvy 50-200-25 MG Tabs tablet Generic drug: bictegravir-emtricitabine-tenofovir AF Take 1 tablet by mouth daily.   QUEtiapine 100 MG tablet Commonly known as: SEROquel Take 1 tablet (100 mg total) by mouth at bedtime.   rosuvastatin 10 MG tablet Commonly known as: Crestor Take 1 tablet (10 mg total) by mouth daily.   tiZANidine 4 MG tablet Commonly known as: ZANAFLEX Take 4 mg by mouth at bedtime as needed.        D/w Dr Thad Ranger Management plans discussed with the patient and he is in agreement. Stable for discharge home  Patient should follow up with neurology  CODE STATUS:     Code Status Orders  (From admission, onward)         Start      Ordered   02/13/19 0706  Full code  Continuous     02/13/19 0705        Code Status History    This patient has a current code status but no historical code status.   Advance Care Planning Activity      TOTAL TIME TAKING CARE OF THIS PATIENT: 38 minutes.    Note: This dictation was prepared with Dragon dictation along with smaller phrase technology. Any transcriptional errors that result from this process are unintentional.  Adrian Saran M.D on 02/13/2019 at 11:27 AM  Between 7am to 6pm - Pager - [phone] After 6pm go to [email or web address] - Social research officer, government  [facility]  Office  [phone]  CC: Primary care physician; Doren Custard, FNP

## 2019-02-13 NOTE — Consult Note (Addendum)
Referring Physician: Mody    Chief Complaint: Chest pain and left sided numbness  HPI: Darrell Kennedy is an 38 y.o. male with a history of HIV and HLD who presents with an episode of chest pain and left sided numbness.  Patient reports that he was watching television last evening and at 2030 had the acute onset of chest tightness and left sided numbness.  Became clammy.  EMS was called an in transit developed difficulty with speech and tunnel vision.  Symptoms lasted about 30 minutes and resolved spontaneously.  Initial NIHSS of 0.   Patient has recently had his psych meds altered.  Date last known well: Date: 02/12/2019 Time last known well: Time: 20:30 tPA Given: No: Resolution of symptoms  Past Medical History:  Diagnosis Date  . Heart murmur   . HIV (human immunodeficiency virus infection) ([facility])   . HLD (hyperlipidemia)     No past surgical history on file.  Family History  Problem Relation Age of Onset  . Hypertension Father   . Diabetes Father   . Kidney disease Father   . Depression Father   . Hyperlipidemia Father   . Alcohol abuse Brother   . Heart disease Paternal Grandmother   . Drug abuse Cousin    Social History:  reports that he has never smoked. He has never used smokeless tobacco. He reports current alcohol use. He reports that he does not use drugs.  Allergies: No Known Allergies  Medications:  I have reviewed the patient's current medications. Prior to Admission: (Not in a hospital admission)  Scheduled: .  stroke: mapping our early stages of recovery book   Does not apply Once  . bictegravir-emtricitabine-tenofovir AF  1 tablet Oral Daily  . enoxaparin (LOVENOX) injection  40 mg Subcutaneous Q24H  . QUEtiapine  100 mg Oral QHS  . rosuvastatin  10 mg Oral Daily    ROS: History obtained from the patient  General ROS: difficulty with sleep Psychological ROS: negative for - behavioral disorder, hallucinations, memory difficulties, mood swings or  suicidal ideation Ophthalmic ROS: as noted in HPI ENT ROS: negative for - epistaxis, nasal discharge, oral lesions, sore throat, tinnitus or vertigo Allergy and Immunology ROS: negative for - hives or itchy/watery eyes Hematological and Lymphatic ROS: negative for - bleeding problems, bruising or swollen lymph nodes Endocrine ROS: negative for - galactorrhea, hair pattern changes, polydipsia/polyuria or temperature intolerance Respiratory ROS: negative for - cough, hemoptysis, shortness of breath or wheezing Cardiovascular ROS: chest pain, palpitations Gastrointestinal ROS: negative for - abdominal pain, diarrhea, hematemesis, nausea/vomiting or stool incontinence Genito-Urinary ROS: negative for - dysuria, hematuria, incontinence or urinary frequency/urgency Musculoskeletal ROS: negative for - joint swelling or muscular weakness Neurological ROS: as noted in HPI Dermatological ROS: negative for rash and skin lesion changes  Physical Examination: Blood pressure 114/69, pulse 66, temperature 98.4 F (36.9 C), temperature source Oral, resp. rate 12, height  (1.727 m), weight 71.7 kg, SpO2 95 %.  HEENT-  Normocephalic, no lesions, without obvious abnormality.  Normal external eye and conjunctiva.  Normal TM's bilaterally.  Normal auditory canals and external ears. Normal external nose, mucus membranes and septum.  Normal pharynx. Cardiovascular- S1, S2 normal, pulses palpable throughout   Lungs- chest clear, no wheezing, rales, normal symmetric air entry Abdomen- soft, non-tender; bowel sounds normal; no masses,  no organomegaly Extremities- no edema Lymph-no adenopathy palpable Musculoskeletal-no joint tenderness, deformity or swelling Skin-warm and dry, no hyperpigmentation, vitiligo, or suspicious lesions  Neurological Examination   Mental  Status: Alert, oriented, thought content appropriate.  Speech fluent without evidence of aphasia.  Able to follow 3 step commands without  difficulty. Cranial Nerves: II: Discs flat bilaterally; Visual fields grossly normal, pupils equal, round, reactive to light and accommodation III,IV, VI: ptosis not present, extra-ocular motions intact bilaterally V,VII: smile symmetric, facial light touch sensation normal bilaterally VIII: hearing normal bilaterally IX,X: gag reflex present XI: bilateral shoulder shrug XII: midline tongue extension Motor: Right : Upper extremity   5/5    Left:     Upper extremity   5/5  Lower extremity   5/5     Lower extremity   5/5 Tone and bulk:normal tone throughout; no atrophy noted Sensory: Pinprick and light touch intact throughout, bilaterally Deep Tendon Reflexes: 2+ and symmetric throughout Plantars: Right: downgoing   Left: downgoing Cerebellar: normal finger-to-nose and normal heel-to-shin testing bilaterally Gait: not tested due to safety concerns   Laboratory Studies:  Basic Metabolic Panel: Recent Labs  Lab 02/12/19 2133  NA 136  K 3.5  CL 100  CO2 27  GLUCOSE 118*  BUN 18  CREATININE 1.39*  CALCIUM 9.2    Liver Function Tests: Recent Labs  Lab 02/12/19 2133  AST 43*  ALT 28  ALKPHOS 59  BILITOT 0.6  PROT 7.6  ALBUMIN 4.3   No results for input(s): LIPASE, AMYLASE in the last 168 hours. No results for input(s): AMMONIA in the last 168 hours.  CBC: Recent Labs  Lab 02/12/19 2133  WBC 9.1  NEUTROABS 3.9  HGB 14.3  HCT 43.0  MCV 87.4  PLT 307    Cardiac Enzymes: Recent Labs  Lab 02/12/19 2329  CKTOTAL 926*    BNP: Invalid input(s): POCBNP  CBG: No results for input(s): GLUCAP in the last 168 hours.  Microbiology: Results for orders placed or performed during the hospital encounter of 05/07/18  Chlamydia/NGC rt PCR ([facility] only)     Status: None   Collection Time: 05/07/18  1:00 PM  Result Value Ref Range Status   Specimen source GC/Chlam URINE, RANDOM  Final   Chlamydia Tr NOT DETECTED NOT DETECTED Final   N gonorrhoeae NOT DETECTED NOT  DETECTED Final    Comment: (NOTE) This CT/NG assay has not been evaluated in patients with a history of  hysterectomy. Performed at [facility], [address] [place], [place] [address]     Coagulation Studies: No results for input(s): LABPROT, INR in the last 72 hours.  Urinalysis: No results for input(s): COLORURINE, LABSPEC, PHURINE, GLUCOSEU, HGBUR, BILIRUBINUR, KETONESUR, PROTEINUR, UROBILINOGEN, NITRITE, LEUKOCYTESUR in the last 168 hours.  Invalid input(s): APPERANCEUR  Lipid Panel:    Component Value Date/Time   CHOL 231 (H) 11/25/2018 1024   TRIG 138 11/25/2018 1024   HDL 45 11/25/2018 1024   CHOLHDL 5.1 (H) 11/25/2018 1024   VLDL 24 09/10/2018 1219   LDLCALC 159 (H) 11/25/2018 1024    HgbA1C: No results found for: HGBA1C  Urine Drug Screen:  No results found for: LABOPIA, COCAINSCRNUR, LABBENZ, AMPHETMU, THCU, LABBARB  Alcohol Level: No results for input(s): ETH in the last 168 hours.  Other results: EKG: normal sinus rhythm with sinus arhythmia at 92 bpm.  Imaging: Dg Chest 2 View  Result Date: 02/12/2019 CLINICAL DATA:  Chest pain EXAM: CHEST - 2 VIEW COMPARISON:  None. FINDINGS: The heart size and mediastinal contours are within normal limits. Both lungs are clear. The visualized skeletal structures are unremarkable. IMPRESSION: No active cardiopulmonary disease. Electronically Signed  By: Jasmine Pang M.D.   On: 02/12/2019 21:55   Ct Head Wo Contrast  Result Date: 02/12/2019 CLINICAL DATA:  38 year old male with left chest tightness and fluttering and left arm coldness. EXAM: CT HEAD WITHOUT CONTRAST TECHNIQUE: Contiguous axial images were obtained from the base of the skull through the vertex without intravenous contrast. COMPARISON:  None. FINDINGS: Brain: The ventricles and sulci appropriate size for patient's age. The gray-white matter discrimination is preserved. There is no acute intracranial hemorrhage. No mass effect or midline shift.  No extra-axial fluid collection. Vascular: No hyperdense vessel or unexpected calcification. Skull: Normal. Negative for fracture or focal lesion. Sinuses/Orbits: No acute finding. Other: None IMPRESSION: Unremarkable noncontrast CT of the brain. Electronically Signed   By: Elgie Collard M.D.   On: 02/12/2019 23:48   Mr Brain Wo Contrast  Result Date: 02/13/2019 CLINICAL DATA:  Left arm paresthesia EXAM: MRI HEAD WITHOUT CONTRAST TECHNIQUE: Multiplanar, multiecho pulse sequences of the brain and surrounding structures were obtained without intravenous contrast. COMPARISON:  None. FINDINGS: BRAIN: There is no acute infarct, acute hemorrhage or extra-axial collection. The white matter signal is normal for the patient's age. The cerebral and cerebellar volume are age-appropriate. There is no hydrocephalus. The midline structures are normal. VASCULAR: The major intracranial arterial and venous sinus flow voids are normal. 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 SKULL AND UPPER CERVICAL SPINE: Calvarial bone marrow signal is normal. There is no skull base mass. The visualized upper cervical spine and soft tissues are normal. SINUSES/ORBITS: There are no fluid levels or advanced mucosal thickening. The mastoid air cells and middle ear cavities are free of fluid. The orbits are normal. IMPRESSION: Normal MRI of the brain. Electronically Signed   By: Deatra Robinson M.D.   On: 02/13/2019 02:21   [place] Carotid Bilateral (at [facility] Only)  Result Date: 02/13/2019 CLINICAL DATA:  38 year old male with a history of no given history EXAM: BILATERAL CAROTID DUPLEX ULTRASOUND TECHNIQUE: Wallace Cullens scale imaging, color Doppler and duplex ultrasound were performed of bilateral carotid and vertebral arteries in the neck. COMPARISON:  None. FINDINGS: Criteria: Quantification of carotid stenosis is based on velocity parameters that correlate the residual internal  carotid diameter with NASCET-based stenosis levels, using the diameter of the distal internal carotid lumen as the denominator for stenosis measurement. The following velocity measurements were obtained: RIGHT ICA:  Systolic 59 cm/sec, Diastolic 29 cm/sec CCA:  109 cm/sec SYSTOLIC ICA/CCA RATIO:  0.5 ECA:  81 cm/sec LEFT ICA:  Systolic 81 cm/sec, Diastolic 37 cm/sec CCA:  101 cm/sec SYSTOLIC ICA/CCA RATIO:  0.8 ECA:  87 cm/sec Right Brachial SBP: Not acquired Left Brachial SBP: Not acquired RIGHT CAROTID ARTERY: Unremarkable appearance of the carotid system without significant atherosclerotic disease. Intermediate waveform of the CCA. Low resistance waveform of the ICA. No significant tortuosity of the carotid system. RIGHT VERTEBRAL ARTERY: Antegrade flow with low resistance waveform. LEFT CAROTID ARTERY: Unremarkable appearance of the carotid system without significant atherosclerotic disease. Intermediate waveform of the CCA. Low resistance waveform of the ICA. No significant tortuosity of the carotid system. LEFT VERTEBRAL ARTERY:  Antegrade flow with low resistance waveform. IMPRESSION: Color duplex indicates no significant plaque, with no hemodynamically significant stenosis by duplex criteria in the extracranial cerebrovascular circulation. Signed, Yvone Neu. Reyne Dumas, RPVI [facility] Rad[facility] Electronically Signed   By: Gilmer Mor D.O.   On: 02/13/2019 08:33    Assessment: 38  y.o. male presenting with an episode of chest pain and left sided numbness.  Symptoms have resolved.  MRI of the brain reviewed and shows no acute changes.  Patient with recent changes in medications.  Carotid dopplers show no evidence of hemodynamically significant stenosis.  Echocardiogram pending.  A1c pending, LDL pending.  Last LDL in August of this year elevated at 159.  Patient on no antiplatelet therapy prior to presentation.   Unclear etiology for presentation but can  not rule out TIA. TSH normal.  CK elevated.  Unclear etiology.    Stroke Risk Factors - hyperlipidemia  Plan: 1. HgbA1c, fasting lipid panel pending.  Goal A1c<7.0.  Goal LDL<70.   2. Echocardiogram pending 3. Prophylactic therapy-Antiplatelet med: Aspirin - dose 81mg  daily 4. Telemetry monitoring 5. Frequent neuro checks 6. If echocardiogram is unremarkable patient to follow up with neurology on an outpatient basis.   7. D/C Benztropine  Case discussed with Dr. Fredirick Maudlin, MD Neurology [phone] 02/13/2019, 11:37 AM

## 2019-02-13 NOTE — ED Notes (Signed)
Called pharmacy to verify orders.

## 2019-02-13 NOTE — ED Notes (Signed)
Pt requesting to speak to MD prior to discharge regarding his seroquel. Pt states he has had episodes where he jerks awake while falling asleep after starting this medication. Page placed by Network engineer to speak to MD Mody.

## 2019-02-13 NOTE — ED Notes (Signed)
Echo at bedside

## 2019-02-13 NOTE — ED Notes (Signed)
Explained that MD has not yet called back. Pt states he would prefer to be discharged now and follow up about medication questions with his PCP and psychiatrist.

## 2019-02-13 NOTE — ED Notes (Signed)
Secure message sent to Dr Benjie Karvonen for diet order

## 2019-02-13 NOTE — ED Notes (Signed)

## 2019-02-13 NOTE — ED Notes (Signed)
Patient transported to Ultrasound 

## 2019-02-13 NOTE — H&P (Signed)
Darrell Kennedy is an 38 y.o. male.   Chief Complaint: Chest pain HPI: The patient with past medical history of HIV compliant on antiretroviral therapy presents to the emergency department complaining of an episode of chest tightness, numbness and word finding difficulty that lasted approximately 30 minutes.  His symptoms started at rest after finishing dinner.  The patient was watching a movie when he felt some chest tightness and shortness of breath.  He then complained of some numbness in his left leg and some difficulty speaking/articulating what was happening with him.  His symptoms resolved spontaneously.  CT scan in the emergency department was negative for acute intracranial process.  MRI was also negative for stroke.  Nonetheless, due to the patient's immunodeficiency and family history of stroke the emergency department asked the hospitalist service for further evaluation.  Past Medical History:  Diagnosis Date  . Heart murmur   . HIV (human immunodeficiency virus infection) (HCC)   . HLD (hyperlipidemia)     No past surgical history on file.  None  Family History  Problem Relation Age of Onset  . Hypertension Father   . Diabetes Father   . Kidney disease Father   . Depression Father   . Hyperlipidemia Father   . Alcohol abuse Brother   . Heart disease Paternal Grandmother   . Drug abuse Cousin    Social History:  reports that he has never smoked. He has never used smokeless tobacco. He reports current alcohol use. He reports that he does not use drugs.  Allergies: No Known Allergies  Prior to Admission medications   Medication Sig Start Date End Date Taking? Authorizing Provider  benztropine (COGENTIN) 0.5 MG tablet Take 1 tablet (0.5 mg total) by mouth at bedtime as needed. Side effects of seroquel 02/06/19  Yes Eappen, Levin Bacon, MD  bictegravir-emtricitabine-tenofovir AF (BIKTARVY) 50-200-25 MG TABS tablet Take 1 tablet by mouth daily. 10/28/18  Yes Lynn Ito, MD   QUEtiapine (SEROQUEL) 100 MG tablet Take 1 tablet (100 mg total) by mouth at bedtime. 02/06/19  Yes Jomarie Longs, MD  rosuvastatin (CRESTOR) 10 MG tablet Take 1 tablet (10 mg total) by mouth daily. 11/27/18  Yes Doren Custard, FNP  tiZANidine (ZANAFLEX) 4 MG tablet Take 4 mg by mouth at bedtime as needed. 01/28/19   [provider]     Results for orders placed or performed during the hospital encounter of 02/12/19 (from the past 48 hour(s))  CBC with Differential     Status: Abnormal   Collection Time: 02/12/19  9:33 PM  Result Value Ref Range   WBC 9.1 4.0 - 10.5 K/uL   RBC 4.92 4.22 - 5.81 MIL/uL   Hemoglobin 14.3 13.0 - 17.0 g/dL   HCT 69.6 29.5 - 28.4 %   MCV 87.4 80.0 - 100.0 fL   MCH 29.1 26.0 - 34.0 pg   MCHC 33.3 30.0 - 36.0 g/dL   RDW 13.2 44.0 - 10.2 %   Platelets 307 150 - 400 K/uL   nRBC 0.0 0.0 - 0.2 %   Neutrophils Relative % 43 %   Neutro Abs 3.9 1.7 - 7.7 K/uL   Lymphocytes Relative 46 %   Lymphs Abs 4.2 (H) 0.7 - 4.0 K/uL   Monocytes Relative 7 %   Monocytes Absolute 0.6 0.1 - 1.0 K/uL   Eosinophils Relative 3 %   Eosinophils Absolute 0.3 0.0 - 0.5 K/uL   Basophils Relative 1 %   Basophils Absolute 0.1 0.0 - 0.1 K/uL  Immature Granulocytes 0 %   Abs Immature Granulocytes 0.03 0.00 - 0.07 K/uL    Comment: Performed at [facility], [address] [place], [place] [address]  Comprehensive metabolic panel     Status: Abnormal   Collection Time: 02/12/19  9:33 PM  Result Value Ref Range   Sodium 136 135 - 145 mmol/L   Potassium 3.5 3.5 - 5.1 mmol/L   Chloride 100 98 - 111 mmol/L   CO2 27 22 - 32 mmol/L   Glucose, Bld 118 (H) 70 - 99 mg/dL   BUN 18 6 - 20 mg/dL   Creatinine, Ser 7.68 (H) 0.61 - 1.24 mg/dL   Calcium 9.2 8.9 - 08.8 mg/dL   Total Protein 7.6 6.5 - 8.1 g/dL   Albumin 4.3 3.5 - 5.0 g/dL   AST 43 (H) 15 - 41 U/L   ALT 28 0 - 44 U/L   Alkaline Phosphatase 59 38 - 126 U/L   Total Bilirubin 0.6 0.3 - 1.2 mg/dL   GFR calc  non Af Amer >60 >60 mL/min   GFR calc Af Amer >60 >60 mL/min   Anion gap 9 5 - 15    Comment: Performed at [facility], [address] [place], [place] [address]  Troponin I (High Sensitivity)     Status: None   Collection Time: 02/12/19  9:33 PM  Result Value Ref Range   Troponin I (High Sensitivity) 4 <18 ng/L    Comment: (NOTE) Elevated high sensitivity troponin I (hsTnI) values and significant  changes across serial measurements may suggest ACS but many other  chronic and acute conditions are known to elevate hsTnI results.  Refer to the "Links" section for chest pain algorithms and additional  guidance. Performed at [facility], [address] [place], [place] [address]   Troponin I (High Sensitivity)     Status: None   Collection Time: 02/12/19 11:29 PM  Result Value Ref Range   Troponin I (High Sensitivity) 4 <18 ng/L    Comment: (NOTE) Elevated high sensitivity troponin I (hsTnI) values and significant  changes across serial measurements may suggest ACS but many other  chronic and acute conditions are known to elevate hsTnI results.  Refer to the "Links" section for chest pain algorithms and additional  guidance. Performed at [facility], [address] [place], [place] [address]   CK     Status: Abnormal   Collection Time: 02/12/19 11:29 PM  Result Value Ref Range   Total CK 926 (H) 49 - 397 U/L    Comment: Performed at [facility], [address] [place], [place] [address]   Dg Chest 2 View  Result Date: 02/12/2019 CLINICAL DATA:  Chest pain EXAM: CHEST - 2 VIEW COMPARISON:  None. FINDINGS: The heart size and mediastinal contours are within normal limits. Both lungs are clear. The visualized skeletal structures are unremarkable. IMPRESSION: No active cardiopulmonary disease. Electronically Signed   By: Jasmine Pang M.D.   On: 02/12/2019 21:55   Ct Head Wo Contrast  Result Date: 02/12/2019 CLINICAL DATA:   38 year old male with left chest tightness and fluttering and left arm coldness. EXAM: CT HEAD WITHOUT CONTRAST TECHNIQUE: Contiguous axial images were obtained from the base of the skull through the vertex without intravenous contrast. COMPARISON:  None. FINDINGS: Brain: The ventricles and sulci appropriate size for patient's age. The gray-white matter discrimination is preserved. There is no acute intracranial hemorrhage. No mass effect or midline shift. No extra-axial  fluid collection. Vascular: No hyperdense vessel or unexpected calcification. Skull: Normal. Negative for fracture or focal lesion. Sinuses/Orbits: No acute finding. Other: None IMPRESSION: Unremarkable noncontrast CT of the brain. Electronically Signed   By: Anner Crete M.D.   On: 02/12/2019 23:48   Mr Brain Wo Contrast  Result Date: 02/13/2019 CLINICAL DATA:  Left arm paresthesia EXAM: MRI HEAD WITHOUT CONTRAST TECHNIQUE: Multiplanar, multiecho pulse sequences of the brain and surrounding structures were obtained without intravenous contrast. COMPARISON:  None. FINDINGS: BRAIN: There is no acute infarct, acute hemorrhage or extra-axial collection. The white matter signal is normal for the patient's age. The cerebral and cerebellar volume are age-appropriate. There is no hydrocephalus. The midline structures are normal. VASCULAR: The major intracranial arterial and venous sinus flow voids are normal. 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 SKULL AND UPPER CERVICAL SPINE: Calvarial bone marrow signal is normal. There is no skull base mass. The visualized upper cervical spine and soft tissues are normal. SINUSES/ORBITS: There are no fluid levels or advanced mucosal thickening. The mastoid air cells and middle ear cavities are free of fluid. The orbits are normal. IMPRESSION: Normal MRI of the brain. Electronically Signed   By: Ulyses Jarred M.D.   On: 02/13/2019 02:21     Review of Systems  Constitutional: Negative for chills and fever.  HENT: Negative for sore throat and tinnitus.   Eyes: Negative for blurred vision and redness.  Respiratory: Positive for shortness of breath. Negative for cough.   Cardiovascular: Negative for chest pain, palpitations, orthopnea and PND.  Gastrointestinal: Negative for abdominal pain, diarrhea, nausea and vomiting.  Genitourinary: Negative for dysuria, frequency and urgency.  Musculoskeletal: Negative for joint pain and myalgias.  Skin: Negative for rash.       No lesions  Neurological: Positive for sensory change and speech change. Negative for focal weakness and weakness.  Endo/Heme/Allergies: Does not bruise/bleed easily.       No temperature intolerance  Psychiatric/Behavioral: Negative for depression and suicidal ideas.    Blood pressure 114/69, pulse 66, temperature 98.4 F (36.9 C), temperature source Oral, resp. rate 12, height 5\' 8"  (1.727 m), weight 71.7 kg, SpO2 95 %. Physical Exam  Vitals reviewed. Constitutional: He is oriented to person, place, and time. He appears well-developed and well-nourished. No distress.  HENT:  Head: Normocephalic and atraumatic.  Mouth/Throat: Oropharynx is clear and moist.  Eyes: Pupils are equal, round, and reactive to light. Conjunctivae and EOM are normal. No scleral icterus.  Neck: Normal range of motion. Neck supple. No JVD present. No tracheal deviation present. No thyromegaly present.  Cardiovascular: Normal rate, regular rhythm and normal heart sounds. Exam reveals no gallop and no friction rub.  No murmur heard. Respiratory: Effort normal and breath sounds normal. No respiratory distress.  GI: Soft. Bowel sounds are normal. He exhibits no distension. There is no abdominal tenderness.  Genitourinary:    Genitourinary Comments: Deferred   Musculoskeletal: Normal range of motion.        General: No edema.  Lymphadenopathy:    He has no cervical adenopathy.   Neurological: He is alert and oriented to person, place, and time. No cranial nerve deficit.  Skin: Skin is warm and dry. No rash noted. No erythema.  Psychiatric: He has a normal mood and affect. His behavior is normal. Judgment and thought content normal.     Assessment/Plan This is a 38 year old male admitted for TIA symptoms. 1.  TIA: Symptoms  have now resolved; obtain echocardiogram and carotid Doppler.  The patient has a history of prolonged QT syndrome but that is not present on current EKG.  Consult neurology. 2.  HIV: Well managed; continue antiretroviral therapy. 3.  Hyperlipidemia: Continue statin therapy 4.  DVT prophylaxis: Lovenox 5.  GI prophylaxis: None The patient is a full code.  Time spent on admission orders and patient care approximately 45 minutes  Arnaldo Nataliamond,  Twan Harkin S, MD 02/13/2019, 7:25 AM

## 2019-02-13 NOTE — ED Notes (Signed)
Pt to MRI

## 2019-02-13 NOTE — Telephone Encounter (Signed)
Requested medication (s) are due for refill today: no  Requested medication (s) are on the active medication list: no  Last refill: 01/17/2019  Future visit scheduled: yes  Notes to clinic: medication was discontinued/dose change   Requested Prescriptions  Pending Prescriptions Disp Refills   QUEtiapine (SEROQUEL) 50 MG tablet [Pharmacy Med Name: QUETIAPINE 50MG  TABLETS] 45 tablet 0    Sig: TAKE 1 AND 1/2 TABLETS(75 MG) BY MOUTH AT BEDTIME     Not Delegated - Psychiatry:  Antipsychotics - Second Generation (Atypical) - quetiapine Failed - 02/13/2019  2:35 PM      Failed - This refill cannot be delegated      Failed - AST in normal range and within 180 days    AST  Date Value Ref Range Status  02/12/2019 43 (H) 15 - 41 U/L Final         Failed - Completed PHQ-2 or PHQ-9 in the last 360 days.      Passed - ALT in normal range and within 180 days    ALT  Date Value Ref Range Status  02/12/2019 28 0 - 44 U/L Final         Passed - Last BP in normal range    BP Readings from Last 1 Encounters:  02/13/19 114/69         Passed - Valid encounter within last 6 months    Recent Outpatient Visits          2 months ago Mood disorder [facility])   [place], FNP   4 months ago [place], [place]      Future Appointments            In 1 month Uvaldo Rising, Astrid Divine, [facility], [facility]

## 2019-02-14 ENCOUNTER — Telehealth: Payer: Self-pay

## 2019-02-14 NOTE — Telephone Encounter (Signed)
Patient had a TIA - went to ED. Has upcoming appointment with neurologist.  Will stop Seroquel.  Will wait and let him have his follow up for the same - before adding a new medication.

## 2019-02-18 ENCOUNTER — Other Ambulatory Visit: Payer: Self-pay | Admitting: Infectious Diseases

## 2019-02-18 DIAGNOSIS — B20 Human immunodeficiency virus [HIV] disease: Secondary | ICD-10-CM

## 2019-02-18 MED ORDER — BIKTARVY 50-200-25 MG PO TABS: 1.0000 | ORAL_TABLET | Freq: Every day | ORAL | 4 refills | Status: DC

## 2019-02-18 NOTE — Telephone Encounter (Signed)
Yes - needs to come from psychiatry.

## 2019-02-18 NOTE — Telephone Encounter (Signed)
Do this medication come from Psychiatry

## 2019-02-20 ENCOUNTER — Other Ambulatory Visit: Payer: Self-pay

## 2019-02-20 ENCOUNTER — Encounter: Payer: Self-pay | Admitting: Family Medicine

## 2019-02-20 ENCOUNTER — Ambulatory Visit (INDEPENDENT_AMBULATORY_CARE_PROVIDER_SITE_OTHER): Payer: PRIVATE HEALTH INSURANCE | Admitting: Family Medicine

## 2019-02-20 VITALS — BP 112/72 | HR 81 | Temp 97.8°F | Resp 14 | Ht 68.0 in | Wt 160.8 lb

## 2019-02-20 DIAGNOSIS — E7800 Pure hypercholesterolemia, unspecified: Secondary | ICD-10-CM

## 2019-02-20 DIAGNOSIS — G459 Transient cerebral ischemic attack, unspecified: Secondary | ICD-10-CM | POA: Diagnosis not present

## 2019-02-20 DIAGNOSIS — F3110 Bipolar disorder, current episode manic without psychotic features, unspecified: Secondary | ICD-10-CM

## 2019-02-20 DIAGNOSIS — R002 Palpitations: Secondary | ICD-10-CM | POA: Insufficient documentation

## 2019-02-20 DIAGNOSIS — R072 Precordial pain: Secondary | ICD-10-CM | POA: Insufficient documentation

## 2019-02-20 NOTE — Progress Notes (Signed)
Name: Darrell Kennedy   MRN: [id number]    DOB: Oct 01, 1980   Date:02/20/2019       Progress Note  Subjective  Chief Complaint  Chief Complaint  Patient presents with  . Hospitalization Follow-up  . Transient Ischemic Attack    HPI  Pt presents for hospital follow up for concern for possible TIA. He was evaluated in the ER.  There was speculation that his seroquel dosage adjustment and/or benzotropine.  He has since stopped his seroquel and benzotropine.   - He has since been to see the neurologist - Dr. Sherryll BurgerShah - who is doing a work-up for hypercoagulable state. Continuing to take ASA 81mg  daily.  Since discharge he has not had any left arm numbness/weakness, no issues with speech.  No facial droop, confusion, blurred vision, no extremity weakness/numbness/weakness.  - Has appointment with Dr. Gwen PoundsKowalski today for evaluation and possible r/o patent foramen ovale and TEE per Dr. Sherryll BurgerShah.  - HLD - taking 10mg  crestor and follow up LDL was 78; initially was [id number].  Denies myalgias or chest pain. Lab Results  Component Value Date   LDLCALC 78 02/12/2019   Bipolar type II: He is off of his psychiatric medications right now; feels a bit foggy at times, but otherwise feeling okay.  He has appointment with Dr. Elna BreslowEappen to follow up and discuss medication changes - 03/03/2019.  He is having some difficulty falling asleep, getting less sleep than usual but is sleeping a few hours a night.   Patient Active Problem List   Diagnosis Date Noted  . TIA (transient ischemic attack) 02/13/2019  . Bipolar I disorder, most recent episode (or current) manic (HCC) 02/06/2019  . At risk for long QT syndrome 02/06/2019  . Mood disorder (HCC) 11/25/2018  . HIV infection (HCC) 10/07/2018   No past surgical history on file.  Family History  Problem Relation Age of Onset  . Hypertension Father   . Diabetes Father   . Kidney disease Father   . Depression Father   . Hyperlipidemia Father   . Alcohol abuse Brother    . Heart disease Paternal Grandmother   . Drug abuse Cousin     Social History   Socioeconomic History  . Marital status: Single    Spouse name: Not on file  . Number of children: 0  . Years of education: Not on file  . Highest education level: Master's degree (e.g., MA, MS, MEng, MEd, MSW, MBA)  Occupational History  . Occupation: Consulting civil engineerstudent  Social Needs  . Financial resource strain: Not hard at all  . Food insecurity    Worry: Never true    Inability: Never true  . Transportation needs    Medical: No    Non-medical: No  Tobacco Use  . Smoking status: Never Smoker  . Smokeless tobacco: Never Used  Substance and Sexual Activity  . Alcohol use: Yes    Frequency: Never    Comment: social maybe once a week  . Drug use: Never  . Sexual activity: Yes    Birth control/protection: Condom  Lifestyle  . Physical activity    Days per week: 6 days    Minutes per session: 60 min  . Stress: Very much  Relationships  . Social connections    Talks on phone: More than three times a week    Gets together: More than three times a week    Attends religious service: Never    Active member of club or organization: No  Attends meetings of clubs or organizations: Never    Relationship status: Never married  . Intimate partner violence    Fear of current or ex partner: No    Emotionally abused: No    Physically abused: No    Forced sexual activity: No  Other Topics Concern  . Not on file  Social History Narrative  . Not on file     Current Outpatient Medications:  .  aspirin (ASPIRIN CHILDRENS) 81 MG chewable tablet, Chew 1 tablet (81 mg total) by mouth daily., Disp: 120 tablet, Rfl: 0 .  bictegravir-emtricitabine-tenofovir AF (BIKTARVY) 50-200-25 MG TABS tablet, Take 1 tablet by mouth daily., Disp: 30 tablet, Rfl: 4 .  rosuvastatin (CRESTOR) 10 MG tablet, Take 1 tablet (10 mg total) by mouth daily., Disp: 90 tablet, Rfl: 3 .  tiZANidine (ZANAFLEX) 4 MG tablet, Take 4 mg by  mouth at bedtime as needed., Disp: , Rfl:   No Known Allergies  I personally reviewed active problem list, medication list, allergies, notes from last encounter, lab results with the patient/caregiver today.   ROS  Constitutional: Negative for fever or weight change.  Respiratory: Negative for cough and shortness of breath.   Cardiovascular: Negative for chest pain or palpitations.  Gastrointestinal: Negative for abdominal pain, no bowel changes.  Musculoskeletal: Negative for gait problem or joint swelling.  Skin: Negative for rash.  Neurological: Negative for dizziness or headache.  No other specific complaints in a complete review of systems (except as listed in HPI above).  Objective  Vitals:   02/20/19 0921  BP: 112/72  Pulse: 81  Resp: 14  Temp: 97.8 F (36.6 C)  TempSrc: Oral  SpO2: 94%  Weight: 160 lb 12.8 oz (72.9 kg)  Height: 5\' 8"  (1.727 m)    Body mass index is 24.45 kg/m.  Physical Exam  Constitutional: Patient appears well-developed and well-nourished. No distress.  HENT: Head: Normocephalic and atraumatic.  Eyes: Conjunctivae and EOM are normal. No scleral icterus.  Pupils are equal, round, and reactive to light.  Neck: Normal range of motion. Neck supple. No JVD present. No thyromegaly present.  Cardiovascular: Normal rate, regular rhythm and normal heart sounds.  No murmur heard. No BLE edema. Pulmonary/Chest: Effort normal and breath sounds normal. No respiratory distress. Musculoskeletal: Normal range of motion, no joint effusions. No gross deformities Neurological: Pt is alert and oriented to person, place, and time. No cranial nerve deficit. Coordination, balance, strength, speech and gait are normal. No limb ataxia, normal RAM. Skin: Skin is warm and dry. No rash noted. No erythema.  Psychiatric: Patient has a normal mood and affect. behavior is normal. Judgment and thought content normal.  No results found for this or any previous visit (from  the past 72 hour(s)).   PHQ2/9: Depression screen [facility] 2/9 02/20/2019 11/25/2018 10/07/2018  Decreased Interest 1 1 1   Down, Depressed, Hopeless 1 1 1   PHQ - 2 Score 2 2 2   Altered sleeping 3 3 0  Tired, decreased energy 2 1 0  Change in appetite 0 1 0  Feeling bad or failure about yourself  1 1 0  Trouble concentrating 2 2 0  Moving slowly or fidgety/restless 0 1 0  Suicidal thoughts 0 0 0  PHQ-9 Score 10 11 2   Difficult doing work/chores Somewhat difficult Somewhat difficult Not difficult at all   PHQ-2/9 Result is positive.    Fall Risk: Fall Risk  02/20/2019 11/25/2018 10/07/2018  Falls in the past year? 0 1 0  Number  falls in past yr: 0 0 0  Injury with Fall? 0 0 0  Follow up Falls evaluation completed Falls evaluation completed -    Assessment & Plan  1. TIA (transient ischemic attack) - Seeing neurology and awaiting lab results at this time.  - Keep follow up with neuro as well as with cardiology later today. - Continue ASA 81mg  and statin therapy; remain off of psychiatric medications until able to see Dr. .  2. Pure hypercholesterolemia - Taking statin therapy  3. Bipolar I disorder, most recent episode (or current) manic (HCC) - Follow up with Dr. Elna Breslow 03/03/2019.  He is having worsening issues with sleep again, advised to call our office or Dr. 03/05/2019 if needing to be seeing for severe insomnia. - Remain off of medications until psychiatry follow up.

## 2019-02-28 ENCOUNTER — Encounter: Payer: Self-pay | Admitting: Family Medicine

## 2019-02-28 ENCOUNTER — Other Ambulatory Visit
Admission: RE | Admit: 2019-02-28 | Discharge: 2019-02-28 | Disposition: A | Discharge status: Home / Self Care | Payer: PRIVATE HEALTH INSURANCE | Source: Ambulatory Visit | Attending: Internal Medicine | Admitting: Internal Medicine

## 2019-02-28 ENCOUNTER — Other Ambulatory Visit: Payer: Self-pay

## 2019-02-28 DIAGNOSIS — Z01812 Encounter for preprocedural laboratory examination: Secondary | ICD-10-CM | POA: Diagnosis present

## 2019-02-28 DIAGNOSIS — Z20828 Contact with and (suspected) exposure to other viral communicable diseases: Secondary | ICD-10-CM | POA: Insufficient documentation

## 2019-03-01 LAB — SARS CORONAVIRUS 2 (TAT 6-24 HRS): SARS Coronavirus 2: NEGATIVE

## 2019-03-03 ENCOUNTER — Ambulatory Visit (INDEPENDENT_AMBULATORY_CARE_PROVIDER_SITE_OTHER): Payer: PRIVATE HEALTH INSURANCE | Admitting: Psychiatry

## 2019-03-03 ENCOUNTER — Other Ambulatory Visit: Payer: Self-pay

## 2019-03-03 ENCOUNTER — Encounter: Payer: Self-pay | Admitting: Psychiatry

## 2019-03-03 DIAGNOSIS — F5105 Insomnia due to other mental disorder: Secondary | ICD-10-CM

## 2019-03-03 DIAGNOSIS — F3181 Bipolar II disorder: Secondary | ICD-10-CM | POA: Diagnosis not present

## 2019-03-03 MED ORDER — TRAZODONE HCL 50 MG PO TABS: 25.0000 mg | ORAL_TABLET | Freq: Every evening | ORAL | 0 refills | Status: DC | PRN

## 2019-03-03 NOTE — Progress Notes (Signed)
Virtual Visit via Video Note  I connected with Darrell Kennedy on 03/03/19 at  3:30 PM EST by a video enabled telemedicine application and verified that I am speaking with the correct person using two identifiers.   I discussed the limitations of evaluation and management by telemedicine and the availability of in person appointments. The patient expressed understanding and agreed to proceed.     I discussed the assessment and treatment plan with the patient. The patient was provided an opportunity to ask questions and all were answered. The patient agreed with the plan and demonstrated an understanding of the instructions.   The patient was advised to call back or seek an in-person evaluation if the symptoms worsen or if the condition fails to improve as anticipated.  BH MD OP Progress Note  03/03/2019 3:57 PM Darrell Kennedy  MRN:  [id number]  Chief Complaint:  Chief Complaint    Follow-up     HPI: Darrell Brownsnthony is a 38 year old Caucasian male, lives in [place], has a history of bipolar disorder, HLD, HIV positive was evaluated by telemedicine today.  Patient today reports he recently had a TIA and is currently being followed by cardiology, neurology.  Patient reports he had multiple work-up done which came back okay except for elevated homocystine level and was advised to take folic acid.  He reports he has a transesophageal echocardiogram scheduled for Wednesday.  He also completed Holter monitoring.  Patient reports he has upcoming appointment with cardiology on December 3 to check back on the status of his testing.  He reports he had another episode recently when he had racing heart rate however it did not last too long.  Patient reports he currently struggles with sleep.  He is completely off of the Seroquel since his providers felt the Seroquel may have contributed to his symptoms.  He reports that Seroquel helped him to sleep and he wants a sleep medication at this time.  Patient  denies any mood swings at this time.  Patient denies any manic or hypomanic episodes.  Patient denies any suicidality, homicidality or perceptual disturbances.  Patient has not been able to find a therapist yet since he was busy.  Patient reports he will work on the same.   Visit Diagnosis:    ICD-10-CM   1. Bipolar 2 disorder (HCC)  F31.81 traZODone (DESYREL) 50 MG tablet   hypomanic, mild  2. Insomnia due to mental condition  F51.05     Past Psychiatric History: I have reviewed past psychiatric history from my progress note on 02/06/2019.  Past trials of BuSpar, Seroquel.  Past Medical History:  Past Medical History:  Diagnosis Date  . Heart murmur   . HIV (human immunodeficiency virus infection) (HCC)   . HLD (hyperlipidemia)    History reviewed. No pertinent surgical history.  Family Psychiatric History: I have reviewed family psychiatric history from my progress note on 02/06/2019  Family History:  Family History  Problem Relation Age of Onset  . Hypertension Father   . Diabetes Father   . Kidney disease Father   . Depression Father   . Hyperlipidemia Father   . Alcohol abuse Brother   . Heart disease Paternal Grandmother   . Drug abuse Cousin     Social History: Reviewed social history from my progress note on 02/06/2019 Social History   Socioeconomic History  . Marital status: Single    Spouse name: Not on file  . Number of children: 0  . Years of education: Not on file  .  Highest education level: Master's degree (e.g., MA, MS, MEng, MEd, MSW, MBA)  Occupational History  . Occupation: Consulting civil engineer  Social Needs  . Financial resource strain: Not hard at all  . Food insecurity    Worry: Never true    Inability: Never true  . Transportation needs    Medical: No    Non-medical: No  Tobacco Use  . Smoking status: Never Smoker  . Smokeless tobacco: Never Used  Substance and Sexual Activity  . Alcohol use: Yes    Frequency: Never    Comment: social maybe  once a week  . Drug use: Never  . Sexual activity: Yes    Birth control/protection: Condom  Lifestyle  . Physical activity    Days per week: 6 days    Minutes per session: 60 min  . Stress: Very much  Relationships  . Social connections    Talks on phone: More than three times a week    Gets together: More than three times a week    Attends religious service: Never    Active member of club or organization: No    Attends meetings of clubs or organizations: Never    Relationship status: Never married  Other Topics Concern  . Not on file  Social History Narrative  . Not on file    Allergies:  Allergies  Allergen Reactions  . Quetiapine Other (See Comments) and Palpitations    Patient stated he had an episode that his body was jerking himself awake   . Seroquel [Quetiapine Fumarate] Palpitations    Couldn't fall asleep, would wake up right before falling fully asleep with palpitations     Metabolic Disorder Labs: Lab Results  Component Value Date   HGBA1C 5.6 02/12/2019   MPG 114.02 02/12/2019   No results found for: PROLACTIN Lab Results  Component Value Date   CHOL 157 02/12/2019   TRIG 135 02/12/2019   HDL 52 02/12/2019   CHOLHDL 3.0 02/12/2019   VLDL 27 02/12/2019   LDLCALC 78 02/12/2019   LDLCALC 159 (H) 11/25/2018   Lab Results  Component Value Date   TSH 1.78 02/12/2019    Therapeutic Level Labs: No results found for: LITHIUM No results found for: VALPROATE No components found for:  CBMZ  Current Medications: Current Outpatient Medications  Medication Sig Dispense Refill  . aspirin (ASPIRIN CHILDRENS) 81 MG chewable tablet Chew 1 tablet (81 mg total) by mouth daily. 120 tablet 0  . bictegravir-emtricitabine-tenofovir AF (BIKTARVY) 50-200-25 MG TABS tablet Take 1 tablet by mouth daily. (Patient taking differently: Take 1 tablet by mouth daily at 12 noon. ) 30 tablet 4  . Levomefolate Glucosamine (METHYLFOLATE PO) Take 1 mg by mouth daily.    .  Multiple Vitamin (MULTIVITAMIN WITH MINERALS) TABS tablet Take 1 tablet by mouth daily.    Marland Kitchen OVER THE COUNTER MEDICATION Take 700 mg by mouth at bedtime as needed (sleep). Passion flower otc supplement    . rosuvastatin (CRESTOR) 10 MG tablet Take 1 tablet (10 mg total) by mouth daily. (Patient taking differently: Take 10 mg by mouth daily at 12 noon. ) 90 tablet 3  . Tetrahydrozoline HCl (VISINE OP) Place 1 drop into both eyes daily as needed (dry eyes).    Marland Kitchen tiZANidine (ZANAFLEX) 4 MG tablet Take 4 mg by mouth daily as needed (pinched nerve).     . traZODone (DESYREL) 50 MG tablet Take 0.5-1.5 tablets (25-75 mg total) by mouth at bedtime as needed for sleep. 45 tablet 0  No current facility-administered medications for this visit.      Musculoskeletal: Strength & Muscle Tone: UTA Gait & Station: normal Patient leans: N/A  Psychiatric Specialty Exam: Review of Systems  Psychiatric/Behavioral: The patient is nervous/anxious and has insomnia.   All other systems reviewed and are negative.   There were no vitals taken for this visit.There is no height or weight on file to calculate BMI.  General Appearance: Casual  Eye Contact:  Fair  Speech:  Clear and Coherent  Volume:  Normal  Mood:  Anxious  Affect:  Congruent  Thought Process:  Goal Directed and Descriptions of Associations: Intact  Orientation:  Full (Time, Place, and Person)  Thought Content: Logical   Suicidal Thoughts:  No  Homicidal Thoughts:  No  Memory:  Immediate;   Fair Recent;   Fair Remote;   Fair  Judgement:  Fair  Insight:  Fair  Psychomotor Activity:  Normal  Concentration:  Concentration: Fair and Attention Span: Fair  Recall:  [employer] of Knowledge: Fair  Language: Fair  Akathisia:  No  Handed:  Right  AIMS (if indicated): Does report jerks when he is falling asleep - does not last long and its full body jerks  Assets:  Communication Skills Desire for Improvement Housing Social Support  ADL's:   Intact  Cognition: WNL  Sleep:  Poor   Screenings: AUDIT     Office Visit from 10/07/2018 in [facility]  Alcohol Use Disorder Identification Test Final Score (AUDIT)  3    PHQ2-9     Office Visit from 02/20/2019 in [facility] Office Visit from 11/25/2018 in [facility] Office Visit from 10/07/2018 in [facility]  PHQ-2 Total Score  2  2  2   PHQ-9 Total Score  10  11  2        Assessment and Plan: Elberta Fortis is a 38 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  Patient does have psychosocial stressors of the current pandemic, relationship struggles as well as recent health issues as summarized above.  Patient continues to struggle with sleep.  Plan as noted below.  Plan Bipolar disorder type II-improving Hydroxyzine 10 mg p.o. daily as needed for anxiety attacks Seroquel discontinued due to recent TIA.  For Insomnia- unstable Start trazodone 25 to 75 mg p.o. nightly as needed.  Patient provided medication education.  Discussed with patient to sign a release to obtain medical records from cardiology.  I have reviewed medical records in [facility] per [facility]- " patient with recent TIA including left-sided weakness of unknown cause.  Patient has risk factors including atrial fibrillation, family history and hyperlipidemia on aspirin, imaging studies performed include MRA, carotid echo, blood work which were normal.  Patient with palpitation which is new onset-last 1 week improving occurring intermittently associated with dizziness, fluttering sensation and TIA.  Chest pain-retrosternal area pressure with known radiation. Proceed to  transesophageal echocardiogram for TIA. Holter monitor for further evaluation of atrial fibrillation, sick sinus syndrome and palpitation Discussed risk reduction and cardiovascular disease process by continuation of lipid management with current medication management for lipid reduction."  I have spent atleast 25 minutes non face to face with patient today. More than 50 % of the time  was spent for psychoeducation and supportive psychotherapy and care coordination. This note was generated in part or whole with voice recognition software. Voice recognition is usually quite accurate but there are transcription errors that can and very often do occur. I apologize for any typographical errors that were not detected and corrected.           Jomarie Longs, MD 03/03/2019, 3:57 PM

## 2019-03-05 ENCOUNTER — Ambulatory Visit
Admission: RE | Admit: 2019-03-05 | Discharge: 2019-03-05 | Disposition: A | Discharge status: Home / Self Care | Payer: PRIVATE HEALTH INSURANCE | Attending: Internal Medicine | Admitting: Internal Medicine

## 2019-03-05 ENCOUNTER — Encounter
Admission: RE | Disposition: A | Discharge status: Home / Self Care | Payer: Self-pay | Source: Home / Self Care | Attending: Internal Medicine

## 2019-03-05 ENCOUNTER — Other Ambulatory Visit: Payer: Self-pay

## 2019-03-05 ENCOUNTER — Ambulatory Visit
Admission: RE | Admit: 2019-03-05 | Discharge: 2019-03-05 | Disposition: A | Discharge status: Home / Self Care | Payer: PRIVATE HEALTH INSURANCE | Source: Home / Self Care | Attending: Internal Medicine | Admitting: Internal Medicine

## 2019-03-05 ENCOUNTER — Encounter: Payer: Self-pay | Admitting: *Deleted

## 2019-03-05 DIAGNOSIS — R002 Palpitations: Secondary | ICD-10-CM | POA: Diagnosis not present

## 2019-03-05 DIAGNOSIS — Z87891 Personal history of nicotine dependence: Secondary | ICD-10-CM | POA: Insufficient documentation

## 2019-03-05 DIAGNOSIS — F329 Major depressive disorder, single episode, unspecified: Secondary | ICD-10-CM | POA: Diagnosis not present

## 2019-03-05 DIAGNOSIS — Z7982 Long term (current) use of aspirin: Secondary | ICD-10-CM | POA: Insufficient documentation

## 2019-03-05 DIAGNOSIS — G459 Transient cerebral ischemic attack, unspecified: Secondary | ICD-10-CM | POA: Diagnosis present

## 2019-03-05 DIAGNOSIS — Z79899 Other long term (current) drug therapy: Secondary | ICD-10-CM | POA: Insufficient documentation

## 2019-03-05 DIAGNOSIS — B20 Human immunodeficiency virus [HIV] disease: Secondary | ICD-10-CM | POA: Diagnosis not present

## 2019-03-05 DIAGNOSIS — E785 Hyperlipidemia, unspecified: Secondary | ICD-10-CM | POA: Diagnosis not present

## 2019-03-05 DIAGNOSIS — I4891 Unspecified atrial fibrillation: Secondary | ICD-10-CM | POA: Diagnosis not present

## 2019-03-05 HISTORY — PX: TEE WITHOUT CARDIOVERSION: SHX5443

## 2019-03-05 SURGERY — ECHOCARDIOGRAM, TRANSESOPHAGEAL
Anesthesia: Moderate Sedation

## 2019-03-05 MED ORDER — LIDOCAINE VISCOUS HCL 2 % MT SOLN
OROMUCOSAL | Status: AC
  Filled 2019-03-05: qty 15

## 2019-03-05 MED ORDER — FENTANYL CITRATE (PF) 100 MCG/2ML IJ SOLN
INTRAMUSCULAR | Status: AC
  Filled 2019-03-05: qty 2

## 2019-03-05 MED ORDER — FENTANYL CITRATE (PF) 100 MCG/2ML IJ SOLN
INTRAMUSCULAR | Status: AC | PRN
  Administered 2019-03-05 (×2): 50 ug via INTRAVENOUS

## 2019-03-05 MED ORDER — MIDAZOLAM HCL 2 MG/2ML IJ SOLN
INTRAMUSCULAR | Status: AC | PRN
  Administered 2019-03-05: 2 mg via INTRAVENOUS
  Administered 2019-03-05: 1 mg via INTRAVENOUS
  Administered 2019-03-05: 2 mg via INTRAVENOUS

## 2019-03-05 MED ORDER — MIDAZOLAM HCL 5 MG/5ML IJ SOLN
INTRAMUSCULAR | Status: AC
  Filled 2019-03-05: qty 5

## 2019-03-05 MED ORDER — BUTAMBEN-TETRACAINE-BENZOCAINE 2-2-14 % EX AERO
INHALATION_SPRAY | CUTANEOUS | Status: AC
  Filled 2019-03-05: qty 5

## 2019-03-05 MED ORDER — SODIUM CHLORIDE 0.9 % IV SOLN
INTRAVENOUS | Status: DC
  Administered 2019-03-05: 07:00:00 via INTRAVENOUS

## 2019-03-05 NOTE — Progress Notes (Signed)
*  PRELIMINARY RESULTS* Echocardiogram Echocardiogram Transesophageal has been performed.  Darrell Kennedy 03/05/2019, 7:54 AM

## 2019-03-05 NOTE — CV Procedure (Signed)
Transesophageal echocardiogram preliminary report  Darrell Kennedy [id number] [birth year]  Preliminary diagnosis  TIA with concerns for cardiac source  Postprocedural diagnosis  Normal LV systolic function no apparent source of embolus  Time out A timeout was performed by the nursing staff and physicians specifically identifying the procedure performed, identification of the patient, the type of sedation, all allergies and medications, all pertinent medical history, and presedation assessment of nasopharynx. The patient and or family understand the risks of the procedure including the rare risks of death, stroke, heart attack, esophogeal perforation, sore throat, and reaction to medications given.  Moderate sedation During this procedure the patient has received Versed 4 milligrams and fentanyl 75 micrograms to achieve appropriate moderate sedation.  The patient had continued monitoring of heart rate, oxygenation, blood pressure, respiratory rate, and extent of signs of sedation throughout the entire procedure.  The patient received this moderate sedation over a period of 20 minutes.  Both the nursing staff and I were present during the procedure when the patient had moderate sedation for 100% of the time.  Treatment considerations  No further cardiac intervention due to no apparent cardiac source of embolus  For further details of transesophageal echocardiogram please refer to final report.  Signed,  Corey Skains M.D. [facility] 03/05/2019 7:53 AM

## 2019-03-06 ENCOUNTER — Encounter: Payer: Self-pay | Admitting: Internal Medicine

## 2019-03-11 ENCOUNTER — Other Ambulatory Visit: Payer: Self-pay

## 2019-03-12 ENCOUNTER — Other Ambulatory Visit: Payer: Self-pay

## 2019-03-12 ENCOUNTER — Encounter: Payer: Self-pay | Admitting: Oncology

## 2019-03-12 ENCOUNTER — Inpatient Hospital Stay: Payer: PRIVATE HEALTH INSURANCE | Attending: Oncology | Admitting: Oncology

## 2019-03-12 DIAGNOSIS — R0789 Other chest pain: Secondary | ICD-10-CM | POA: Diagnosis not present

## 2019-03-12 DIAGNOSIS — Z8673 Personal history of transient ischemic attack (TIA), and cerebral infarction without residual deficits: Secondary | ICD-10-CM | POA: Diagnosis not present

## 2019-03-12 DIAGNOSIS — Z7982 Long term (current) use of aspirin: Secondary | ICD-10-CM | POA: Insufficient documentation

## 2019-03-12 DIAGNOSIS — R760 Raised antibody titer: Secondary | ICD-10-CM | POA: Insufficient documentation

## 2019-03-12 DIAGNOSIS — Z79899 Other long term (current) drug therapy: Secondary | ICD-10-CM | POA: Insufficient documentation

## 2019-03-12 NOTE — Progress Notes (Signed)
[facility]  [phone] [phone]  ID: Darrell Kennedy OB: January 23, 1981  MR#: [id number]  BPZ#:[id number]  Patient Care Team: Darrell Custard, FNP as PCP - General (Family Medicine)  CHIEF COMPLAINT: TIA, concern for antiphospholipid syndrome.  INTERVAL HISTORY: Patient is a 38 year old male who recently has a TIA. Workup into the etiology revealed an elevated Homocystine level and a mildly elevated antiphospholipid. He currently feels well and is asymptomatic. He has no residual neurologic complaints.  He denies and recent fevers or illnesses.  He has a good appetite and denies weight loss. He has no chest pain, shortness of breath, cough, or hemoptysis. He denies any nausea, vomiting, constipation, or diarrhea.  He has no urinary complaints.  He feels at his baseline today and offers no significant complaints today.  REVIEW OF SYSTEMS:   Review of Systems  Constitutional: Negative.  Negative for fever, malaise/fatigue and weight loss.  Respiratory: Negative.  Negative for cough, hemoptysis and shortness of breath.   Cardiovascular: Negative.  Negative for chest pain and leg swelling.  Gastrointestinal: Negative.  Negative for abdominal pain, blood in stool and melena.  Genitourinary: Negative.  Negative for frequency.  Musculoskeletal: Negative.  Negative for back pain.  Skin: Negative.  Negative for rash.  Neurological: Negative.  Negative for dizziness, speech change, focal weakness, weakness and headaches.  Psychiatric/Behavioral: Negative.  The patient is not nervous/anxious.     As per HPI. Otherwise, a complete review of systems is negative.  PAST MEDICAL HISTORY: Past Medical History:  Diagnosis Date   Heart murmur    HIV (human immunodeficiency virus infection) (HCC)    HLD (hyperlipidemia)    Stroke [facility])     PAST SURGICAL HISTORY: Past Surgical History:  Procedure Laterality Date   TEE WITHOUT CARDIOVERSION N/A 03/05/2019    Procedure: TRANSESOPHAGEAL ECHOCARDIOGRAM (TEE);  Surgeon: Darrell Blinks, MD;  Location: [facility] ORS;  Service: Cardiovascular;  Laterality: N/A;    FAMILY HISTORY: Family History  Problem Relation Age of Onset   Hypertension Father    Diabetes Father    Kidney disease Father    Depression Father    Hyperlipidemia Father    Alcohol abuse Brother    Heart disease Paternal Grandmother    Drug abuse Cousin     ADVANCED DIRECTIVES (Y/N):  N  HEALTH MAINTENANCE: Social History   Tobacco Use   Smoking status: Never Smoker   Smokeless tobacco: Never Used  Substance Use Topics   Alcohol use: Yes    Frequency: Never    Comment: social maybe once a week   Drug use: Never     Colonoscopy:  PAP:  Bone density:  Lipid panel:  Allergies  Allergen Reactions   Quetiapine Other (See Comments) and Palpitations    Patient stated he had an episode that his body was jerking himself awake    Seroquel [Quetiapine Fumarate] Palpitations    Couldn't fall asleep, would wake up right before falling fully asleep with palpitations     Current Outpatient Medications  Medication Sig Dispense Refill   aspirin (ASPIRIN CHILDRENS) 81 MG chewable tablet Chew 1 tablet (81 mg total) by mouth daily. 120 tablet 0   bictegravir-emtricitabine-tenofovir AF (BIKTARVY) 50-200-25 MG TABS tablet Take 1 tablet by mouth daily. (Patient taking differently: Take 1 tablet by mouth daily at 12 noon. ) 30 tablet 4   Levomefolate Glucosamine (METHYLFOLATE PO) Take 1 mg by mouth daily.     Multiple Vitamin (MULTIVITAMIN WITH MINERALS) TABS tablet  Take 1 tablet by mouth daily.     rosuvastatin (CRESTOR) 10 MG tablet Take 1 tablet (10 mg total) by mouth daily. (Patient taking differently: Take 10 mg by mouth daily at 12 noon. ) 90 tablet 3   traZODone (DESYREL) 50 MG tablet Take 0.5-1.5 tablets (25-75 mg total) by mouth at bedtime as needed for sleep. 45 tablet 0   OVER THE COUNTER MEDICATION  Take 700 mg by mouth at bedtime as needed (sleep). Passion flower otc supplement     Tetrahydrozoline HCl (VISINE OP) Place 1 drop into both eyes daily as needed (dry eyes).     tiZANidine (ZANAFLEX) 4 MG tablet Take 4 mg by mouth daily as needed (pinched nerve).      No current facility-administered medications for this visit.     OBJECTIVE: Vitals:   03/12/19 1132  BP: 136/72  Pulse: 90  Resp: 18  Temp: 98.3 F (36.8 C)  SpO2: 100%     Body mass index is 25.42 kg/m.    ECOG FS:0 - Asymptomatic  General: Well-developed, well-nourished, no acute distress. Eyes: Pink conjunctiva, anicteric sclera. HEENT: Normocephalic, moist mucous membranes, clear oropharnyx. Lungs: Clear to auscultation bilaterally. Heart: Regular rate and rhythm. No rubs, murmurs, or gallops. Abdomen: Soft, nontender, nondistended. No organomegaly noted, normoactive bowel sounds. Musculoskeletal: No edema, cyanosis, or clubbing. Neuro: Alert, answering all questions appropriately. Cranial nerves grossly intact. Skin: No rashes or petechiae noted. Psych: Normal affect. Lymphatics: No cervical, calvicular, axillary or inguinal LAD.   LAB RESULTS:  Lab Results  Component Value Date   NA 136 02/12/2019   K 3.5 02/12/2019   CL 100 02/12/2019   CO2 27 02/12/2019   GLUCOSE 118 (H) 02/12/2019   BUN 18 02/12/2019   CREATININE 1.39 (H) 02/12/2019   CALCIUM 9.2 02/12/2019   PROT 7.6 02/12/2019   ALBUMIN 4.3 02/12/2019   AST 43 (H) 02/12/2019   ALT 28 02/12/2019   ALKPHOS 59 02/12/2019   BILITOT 0.6 02/12/2019   GFRNONAA >60 02/12/2019   GFRAA >60 02/12/2019    Lab Results  Component Value Date   WBC 9.1 02/12/2019   NEUTROABS 3.9 02/12/2019   HGB 14.3 02/12/2019   HCT 43.0 02/12/2019   MCV 87.4 02/12/2019   PLT 307 02/12/2019     STUDIES: Dg Chest 2 View  Result Date: 02/12/2019 CLINICAL DATA:  Chest pain EXAM: CHEST - 2 VIEW COMPARISON:  None. FINDINGS: The heart size and mediastinal  contours are within normal limits. Both lungs are clear. The visualized skeletal structures are unremarkable. IMPRESSION: No active cardiopulmonary disease. Electronically Signed   By: Darrell Kennedy M.D.   On: 02/12/2019 21:55   Ct Head Wo Contrast  Result Date: 02/12/2019 CLINICAL DATA:  38 year old male with left chest tightness and fluttering and left arm coldness. EXAM: CT HEAD WITHOUT CONTRAST TECHNIQUE: Contiguous axial images were obtained from the base of the skull through the vertex without intravenous contrast. COMPARISON:  None. FINDINGS: Brain: The ventricles and sulci appropriate size for patient's age. The gray-white matter discrimination is preserved. There is no acute intracranial hemorrhage. No mass effect or midline shift. No extra-axial fluid collection. Vascular: No hyperdense vessel or unexpected calcification. Skull: Normal. Negative for fracture or focal lesion. Sinuses/Orbits: No acute finding. Other: None IMPRESSION: Unremarkable noncontrast CT of the brain. Electronically Signed   By: Darrell Kennedy M.D.   On: 02/12/2019 23:48   Mr Brain Wo Contrast  Result Date: 02/13/2019 CLINICAL DATA:  Left arm paresthesia EXAM:  MRI HEAD WITHOUT CONTRAST TECHNIQUE: Multiplanar, multiecho pulse sequences of the brain and surrounding structures were obtained without intravenous contrast. COMPARISON:  None. FINDINGS: BRAIN: There is no acute infarct, acute hemorrhage or extra-axial collection. The white matter signal is normal for the patient's age. The cerebral and cerebellar volume are age-appropriate. There is no hydrocephalus. The midline structures are normal. VASCULAR: The major intracranial arterial and venous sinus flow voids are normal. 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 SKULL AND UPPER CERVICAL SPINE: Calvarial bone marrow signal is normal. There is no skull base mass. The visualized upper cervical spine and soft  tissues are normal. SINUSES/ORBITS: There are no fluid levels or advanced mucosal thickening. The mastoid air cells and middle ear cavities are free of fluid. The orbits are normal. IMPRESSION: Normal MRI of the brain. Electronically Signed   By: Darrell Kennedy M.D.   On: 02/13/2019 02:21   US Carotid Bilateral (at [facility] Only)  Result Date: 02/13/2019 CLINICAL DATA:  38 year old male with a history of no given history EXAM: BILATERAL CAROTID DUPLEX ULTRASOUND TECHNIQUE: Darrell Kennedy scale imaging, color Doppler and duplex ultrasound were performed of bilateral carotid and vertebral arteries in the neck. COMPARISON:  None. FINDINGS: Criteria: Quantification of carotid stenosis is based on velocity parameters that correlate the residual internal carotid diameter with NASCET-based stenosis levels, using the diameter of the distal internal carotid lumen as the denominator for stenosis measurement. The following velocity measurements were obtained: RIGHT ICA:  Systolic 59 cm/sec, Diastolic 29 cm/sec CCA:  782 cm/sec SYSTOLIC ICA/CCA RATIO:  0.5 ECA:  81 cm/sec LEFT ICA:  Systolic 81 cm/sec, Diastolic 37 cm/sec CCA:  956 cm/sec SYSTOLIC ICA/CCA RATIO:  0.8 ECA:  87 cm/sec Right Brachial SBP: Not acquired Left Brachial SBP: Not acquired RIGHT CAROTID ARTERY: Unremarkable appearance of the carotid system without significant atherosclerotic disease. Intermediate waveform of the CCA. Low resistance waveform of the ICA. No significant tortuosity of the carotid system. RIGHT VERTEBRAL ARTERY: Antegrade flow with low resistance waveform. LEFT CAROTID ARTERY: Unremarkable appearance of the carotid system without significant atherosclerotic disease. Intermediate waveform of the CCA. Low resistance waveform of the ICA. No significant tortuosity of the carotid system. LEFT VERTEBRAL ARTERY:  Antegrade flow with low resistance waveform. IMPRESSION: Color duplex indicates no significant plaque, with no hemodynamically significant  stenosis by duplex criteria in the extracranial cerebrovascular circulation. Signed, Darrell Kennedy, RPVI [facility] Electronically Signed   By: Darrell Kennedy D.O.   On: 02/13/2019 08:33    ASSESSMENT: TIA, concern for antiphospholipid syndrome.  PLAN:    1. TIA, concern for antiphospholipid syndrome: MRI of the brain review independently and reported as above with no intracranial pathology.  He has no personal or family history of blood clots.  Anticardiolipin antibody, IgM was only mildly elevated at 13 with normal range 0-12.  This result is indeterminate. Without a definitive blood clot, it is unlikely he has antiphospholipid syndrome. He does not require any additional anticoagulation other than 81 mg asprin at this time.  Will repeat laboratory work in 8 weeks to assess whether Anticardiolipin antibody, IgM is still present.  He will have a video assisted telemedicine visit 1 week later to discuss the results. 2.  Elevated homocystine:  Likely clinically insignificant. Agree with folic acid supplementation.  Repeat in 8 weeks as above.  I spent a total of 45 minutes face-to-face with the patient of which  greater than 50% of the visit was spent in counseling and coordination of care as detailed above.  Patient expressed understanding and was in agreement with this plan. He also understands that He can call clinic at any time with any questions, concerns, or complaints.    Darrell Ruthsimothy J Shyanne Mcclary, MD   03/12/2019 1:08 PM

## 2019-03-12 NOTE — Progress Notes (Signed)
Patient is in for a New patient consult, states he is feeling ok today with no complaints.

## 2019-03-13 ENCOUNTER — Ambulatory Visit: Payer: PRIVATE HEALTH INSURANCE | Admitting: Infectious Diseases

## 2019-03-19 NOTE — Telephone Encounter (Signed)
Patient stated that he received medication from Psychiatry

## 2019-03-20 ENCOUNTER — Encounter: Payer: Self-pay | Admitting: Infectious Diseases

## 2019-03-20 ENCOUNTER — Other Ambulatory Visit
Admission: RE | Admit: 2019-03-20 | Discharge: 2019-03-20 | Disposition: A | Discharge status: Home / Self Care | Payer: PRIVATE HEALTH INSURANCE | Source: Ambulatory Visit | Attending: Infectious Diseases | Admitting: Infectious Diseases

## 2019-03-20 ENCOUNTER — Ambulatory Visit: Payer: PRIVATE HEALTH INSURANCE | Attending: Infectious Diseases | Admitting: Infectious Diseases

## 2019-03-20 ENCOUNTER — Other Ambulatory Visit: Payer: Self-pay

## 2019-03-20 VITALS — BP 116/75 | HR 77 | Temp 97.4°F | Resp 16 | Ht 68.0 in | Wt 164.2 lb

## 2019-03-20 DIAGNOSIS — Z7982 Long term (current) use of aspirin: Secondary | ICD-10-CM | POA: Diagnosis not present

## 2019-03-20 DIAGNOSIS — Z79899 Other long term (current) drug therapy: Secondary | ICD-10-CM

## 2019-03-20 DIAGNOSIS — F319 Bipolar disorder, unspecified: Secondary | ICD-10-CM

## 2019-03-20 DIAGNOSIS — Z8673 Personal history of transient ischemic attack (TIA), and cerebral infarction without residual deficits: Secondary | ICD-10-CM | POA: Diagnosis not present

## 2019-03-20 DIAGNOSIS — B20 Human immunodeficiency virus [HIV] disease: Secondary | ICD-10-CM | POA: Insufficient documentation

## 2019-03-20 LAB — COMPREHENSIVE METABOLIC PANEL
ALT: 26 U/L (ref 0–44)
AST: 23 U/L (ref 15–41)
Albumin: 4.6 g/dL (ref 3.5–5.0)
Alkaline Phosphatase: 61 U/L (ref 38–126)
Anion gap: 9 (ref 5–15)
BUN: 16 mg/dL (ref 6–20)
CO2: 27 mmol/L (ref 22–32)
Calcium: 9.6 mg/dL (ref 8.9–10.3)
Chloride: 100 mmol/L (ref 98–111)
Creatinine, Ser: 1.38 mg/dL — ABNORMAL HIGH (ref 0.61–1.24)
GFR calc Af Amer: >60 mL/min (ref >60)
GFR calc non Af Amer: >60 mL/min (ref >60)
Glucose, Bld: 105 mg/dL — ABNORMAL HIGH (ref 70–99)
Potassium: 3.9 mmol/L (ref 3.5–5.1)
Sodium: 136 mmol/L (ref 135–145)
Total Bilirubin: 0.8 mg/dL (ref 0.3–1.2)
Total Protein: 8 g/dL (ref 6.5–8.1)

## 2019-03-20 NOTE — Progress Notes (Signed)
NAME: Darrell Kennedy  DOB: [birth year]  MRN: [id number]  Date/Time: 03/20/2019 11:24 AM  Subjective:  Follow up  ? Darrell Kennedy is a 38 y.o. with a history of HIV Is here for follow up. Last seen in May 2020 Since his last visit he was admitted to the [facility] on 02/12/19 for a day  with TIA like episode when he felt the left arm and leg tingling preceded by palpitation. Was transient. underwent work up with Neg MRI, ECHO, TEE which did not show any intracardiac shunting. He saw neurologist Dr.Shah at the [facility] clinic on 02/17/19  and he sent hypercoagulable panel ESR, CRP - Factor V Leiden mutation - Antithrombin III functional assay - MTHFR gene mutation - Protein C and S activity (functional assay) - Homocysteine - Lupus anticoagulant comprehensive - Antiphospholipid syndrome (includes aPTT, PT, INR, Thrombin time, Dilute Viper Venom Time, Hexagonal phase phospholipid, Anticoardiolipin antibody IgG and IgM, Beta - 2 Glycoprotein IgG and IgM,   Anticardiolipin IgM  ab was elevated at 13 ( 0-12 ) N and homocysteine was 16( 0-14.5) Started on methylfolate  and aspirin . seen heme once Dr.Finnegan  Heis doing better now, but has some issues with sleep- when he tries to go to sleep he has bene woken up with some palpitations . Initially thought it was due to seraquil - stopped it, had 1or 2 episodes after that.   Doing well, on Biktarvy, 100% adherent - no side effects from the medication.    HIV history HIV diagnosed NOV 2015 in  [place] when he had flu like symptoms and knew he had acute HIV and went and got tested. Nadir Cd4 was > 700  He saw Dr.PAul Tamala Julian and was started on isentress and truvada. He later changed to [facility]. Since end of 2017 he was off treatment because of lack of insurance. He moved to [facility] in 2018 and did not engage in care because of lack on insurance. Pt has insurance now [employer])  Nadir Cd4 744 VL >200,000 OI none HAARt history truvada  isentress Genvoya Acquired thru-sex Genotype done in [place] ? Past Medical History:  Diagnosis Date  . Heart murmur   . HIV (human immunodeficiency virus infection) ([place])   . HLD (hyperlipidemia)   . Stroke [facility])     Surgical History -none  FH Father- diabetes, HTN Mother-adrenal tumor removed  ?SH Non smoker No illicit drug use Occasional alcohol Has traveled to [place] in 2013 Lives with his parents  ? Current Outpatient Medications  Medication Sig Dispense Refill  . aspirin (ASPIRIN CHILDRENS) 81 MG chewable tablet Chew 1 tablet (81 mg total) by mouth daily. 120 tablet 0  . bictegravir-emtricitabine-tenofovir AF (BIKTARVY) 50-200-25 MG TABS tablet Take 1 tablet by mouth daily. (Patient taking differently: Take 1 tablet by mouth daily at 12 noon. ) 30 tablet 4  . Levomefolate Glucosamine (METHYLFOLATE PO) Take 1 mg by mouth daily.    . Multiple Vitamin (MULTIVITAMIN WITH MINERALS) TABS tablet Take 1 tablet by mouth daily.    . rosuvastatin (CRESTOR) 10 MG tablet Take 1 tablet (10 mg total) by mouth daily. (Patient taking differently: Take 10 mg by mouth daily at 12 noon. ) 90 tablet 3  . tiZANidine (ZANAFLEX) 4 MG tablet Take 4 mg by mouth daily as needed (pinched nerve).     . traZODone (DESYREL) 50 MG tablet Take 0.5-1.5 tablets (25-75 mg total) by mouth at bedtime as needed for sleep. 45 tablet 0  . OVER  THE COUNTER MEDICATION Take 700 mg by mouth at bedtime as needed (sleep). Passion flower otc supplement    . Tetrahydrozoline HCl (VISINE OP) Place 1 drop into both eyes daily as needed (dry eyes).     No current facility-administered medications for this visit.     REVIEW OF SYSTEMS:  Const: negative fever, negative chills, negative weight loss Eyes: negative diplopia or visual changes, negative eye pain ENT: negative coryza, negative sore throat Resp: negative cough, hemoptysis, dyspnea Cards: negative for chest pain, palpitations, lower extremity edema GU: negative for  frequency, dysuria and hematuria Skin: negative for rash and pruritus Heme: negative for easy bruising and gum/nose bleeding MS: negative for myalgias, arthralgias, back pain and muscle weakness Neurolo:negative for headaches, dizziness, vertigo, memory problems  Psych: bipolar disorder  Objective:  VITALS:  BP 116/75   Pulse 77   Temp (!) 97.4 F (36.3 C) (Oral)   Resp 16   Ht _0  (1.727 m)   Wt 164 lb 3.2 oz (74.5 kg)   SpO2 96%   BMI 24.97 kg/m  PHYSICAL EXAM:  General: Alert, cooperative, no distress, appears stated age.  Head: Normocephalic, without obvious abnormality, atraumatic. Eyes: Conjunctivae clear, anicteric sclerae. Pupils are equal Nose: Nares normal. No drainage or sinus tenderness. Throat: Lips, mucosa, and tongue normal. No Thrush Neck: Supple, symmetrical, no adenopathy, thyroid: non tender no carotid bruit and no JVD. Back: No CVA tenderness. Lungs: Clear to auscultation bilaterally. No Wheezing or Rhonchi. No rales. Heart: Regular rate and rhythm, no murmur, rub or gallop. Abdomen: Soft, non-tender,not distended. Bowel sounds normal. No masses Extremities: Extremities normal, atraumatic, no cyanosis. No edema. No clubbing Skin: No rashes or lesions. Not Jaundiced Lymph: Cervical, supraclavicular normal. Neurologic: Grossly non-focal Pertinent Labs  IMAGING RESULTS: Health maintenance Vaccination  Vaccine Date last given comment  Influenza 01/24/19   Hepatitis B 05/15/2014,2/29 &11/24/14 [place]  Hepatitis A 05/15/14 &06/15/14   Prevnar-PCV-13 04/02/2014   Pneumovac-PPSV-23 09/10/18   TdaP 05/15/14   HPV    Shingrix ( zoster vaccine)     ______________________  Labs Lab Result  Date comment  HIV VL 15,300 05/07/18   CD4 458(18.3%) 05/07/18   Genotype     [id number] NEG    HIV antibody Reactive 05/07/18   RPR 1:1 ( but TAP NEG 05/07/18 False positive  Quantiferon Gold NR 05/07/18   Hep C ab     Hepatitis B-ab,ag,c Ab-positive 05/07/18 vaccinated   Hepatitis A-IgM, IgG /T     Lipid     GC/CHL     PAP     HB,PLT,Cr, LFT 14/284/1.34/N      Preventive  Procedure Result  Date comment  colonoscopy     Mammogram     Dental exam     Opthal       Impression/Recommendation ? HIV- on Biktarvy- doing well- 100% adherent . Will do labs today  Recent TIA like episode- investigated and fond to have borderline high Anticardiolipin IgM and homocysteine- on aspirin and methyl folate  Bipolar  disorder- followed by Psychiatrist  Partner notification not needed as pt not sexually active in the past year   Need anal PAP Labs tody  Follow up 6 months ___________________________________________________ Discussed with patient

## 2019-03-20 NOTE — Patient Instructions (Signed)
You are here for follow up of virus. You are on Biktarvy- will do labs today. Will follow up 6 months or earlier if needed

## 2019-03-21 DIAGNOSIS — G4733 Obstructive sleep apnea (adult) (pediatric): Secondary | ICD-10-CM | POA: Insufficient documentation

## 2019-03-21 LAB — T-HELPER CELLS CD4/CD8 %
% CD 4 Pos. Lymph.: 25.8 % — ABNORMAL LOW (ref 30.8–58.5)
Absolute CD 4 Helper: 697 /uL (ref 359–1519)
Basophils Absolute: 0.1 10*3/uL (ref 0.0–0.2)
Basos: 1 %
CD3+CD4+ Cells/CD3+CD8+ Cells Bld: 0.49 — ABNORMAL LOW (ref 0.92–3.72)
CD3+CD8+ Cells # Bld: 1434 /uL — ABNORMAL HIGH (ref 109–897)
CD3+CD8+ Cells NFr Bld: 53.1 % — ABNORMAL HIGH (ref 12.0–35.5)
EOS (ABSOLUTE): 0.2 10*3/uL (ref 0.0–0.4)
Eos: 4 %
Hematocrit: 42.9 % (ref 37.5–51.0)
Hemoglobin: 15 g/dL (ref 13.0–17.7)
Immature Grans (Abs): 0 10*3/uL (ref 0.0–0.1)
Immature Granulocytes: 0 %
Lymphocytes Absolute: 2.7 10*3/uL (ref 0.7–3.1)
Lymphs: 44 %
MCH: 29.9 pg (ref 26.6–33.0)
MCHC: 35 g/dL (ref 31.5–35.7)
MCV: 86 fL (ref 79–97)
Monocytes Absolute: 0.4 10*3/uL (ref 0.1–0.9)
Monocytes: 7 %
Neutrophils Absolute: 2.6 10*3/uL (ref 1.4–7.0)
Neutrophils: 44 %
Platelets: 299 10*3/uL (ref 150–450)
RBC: 5.01 x10E6/uL (ref 4.14–5.80)
RDW: 12.5 % (ref 11.6–15.4)
WBC: 6 10*3/uL (ref 3.4–10.8)

## 2019-03-21 LAB — HIV-1 RNA QUANT-NO REFLEX-BLD
HIV 1 RNA Quant: 30 copies/mL
LOG10 HIV-1 RNA: 1.477 log10copy/mL

## 2019-03-27 ENCOUNTER — Other Ambulatory Visit (HOSPITAL_COMMUNITY)
Admission: RE | Admit: 2019-03-27 | Discharge: 2019-03-27 | Disposition: A | Discharge status: Home / Self Care | Payer: PRIVATE HEALTH INSURANCE | Source: Ambulatory Visit | Attending: Family Medicine | Admitting: Family Medicine

## 2019-03-27 ENCOUNTER — Ambulatory Visit (INDEPENDENT_AMBULATORY_CARE_PROVIDER_SITE_OTHER): Payer: PRIVATE HEALTH INSURANCE | Admitting: Psychiatry

## 2019-03-27 ENCOUNTER — Encounter: Payer: Self-pay | Admitting: Family Medicine

## 2019-03-27 ENCOUNTER — Encounter: Payer: Self-pay | Admitting: Psychiatry

## 2019-03-27 ENCOUNTER — Ambulatory Visit (INDEPENDENT_AMBULATORY_CARE_PROVIDER_SITE_OTHER): Payer: PRIVATE HEALTH INSURANCE | Admitting: Family Medicine

## 2019-03-27 ENCOUNTER — Other Ambulatory Visit: Payer: Self-pay

## 2019-03-27 VITALS — BP 120/80 | HR 100 | Temp 97.3°F | Resp 16 | Ht 68.0 in | Wt 163.9 lb

## 2019-03-27 DIAGNOSIS — F3181 Bipolar II disorder: Secondary | ICD-10-CM

## 2019-03-27 DIAGNOSIS — R002 Palpitations: Secondary | ICD-10-CM

## 2019-03-27 DIAGNOSIS — Z113 Encounter for screening for infections with a predominantly sexual mode of transmission: Secondary | ICD-10-CM | POA: Diagnosis not present

## 2019-03-27 DIAGNOSIS — Z21 Asymptomatic human immunodeficiency virus [HIV] infection status: Secondary | ICD-10-CM

## 2019-03-27 DIAGNOSIS — R7989 Other specified abnormal findings of blood chemistry: Secondary | ICD-10-CM

## 2019-03-27 DIAGNOSIS — G459 Transient cerebral ischemic attack, unspecified: Secondary | ICD-10-CM | POA: Diagnosis not present

## 2019-03-27 DIAGNOSIS — F5105 Insomnia due to other mental disorder: Secondary | ICD-10-CM | POA: Diagnosis not present

## 2019-03-27 DIAGNOSIS — E7800 Pure hypercholesterolemia, unspecified: Secondary | ICD-10-CM

## 2019-03-27 DIAGNOSIS — R760 Raised antibody titer: Secondary | ICD-10-CM

## 2019-03-27 DIAGNOSIS — Z0000 Encounter for general adult medical examination without abnormal findings: Secondary | ICD-10-CM

## 2019-03-27 MED ORDER — QUETIAPINE FUMARATE 50 MG PO TABS: 50.0000 mg | ORAL_TABLET | Freq: Every day | ORAL | 0 refills | Status: DC

## 2019-03-27 MED ORDER — ATORVASTATIN CALCIUM 20 MG PO TABS: 20.0000 mg | ORAL_TABLET | Freq: Every day | ORAL | 3 refills | Status: DC

## 2019-03-27 MED ORDER — TRAZODONE HCL 50 MG PO TABS: 25.0000 mg | ORAL_TABLET | Freq: Every evening | ORAL | 0 refills | Status: DC | PRN

## 2019-03-27 NOTE — Progress Notes (Signed)
Virtual Visit via Video Note  I connected with Darrell Kennedy on 03/27/19 at  1:30 PM EST by a video enabled telemedicine application and verified that I am speaking with the correct person using two identifiers.   I discussed the limitations of evaluation and management by telemedicine and the availability of in person appointments. The patient expressed understanding and agreed to proceed.     I discussed the assessment and treatment plan with the patient. The patient was provided an opportunity to ask questions and all were answered. The patient agreed with the plan and demonstrated an understanding of the instructions.   The patient was advised to call back or seek an in-person evaluation if the symptoms worsen or if the condition fails to improve as anticipated [facility] MD OP Progress Note  03/27/2019 5:29 PM Darrell Kennedy  MRN:  [id number]  Chief Complaint:  Chief Complaint    Follow-up     HPI: Darrell Kennedy is a 38 year old Caucasian male, lives in [place], has a history of bipolar disorder, hyperlipidemia, HIV positive, recent TIA, was evaluated by telemedicine today.  Patient today reports ever since going off of the Seroquel he has noticed mild mood swings.  There has been times when he felt on edge and all over the place.  Patient however reports he feels better now.  However he continues to struggle with sleep.  He reports the trazodone lets him sleep only 3 hours and he is wide awake after that.  The trazodone definitely has helped some with his mood symptoms.  He however would like to be on another sleep aid.  He did try the Seroquel few times which definitely helps him to sleep through the night and he likes the effect of Seroquel.  Patient reports he did struggle with jerks or sensation of falling which makes him wake up again when he tries to fall asleep at night.  It only happens when he is trying to fall asleep.  Initially he thought the Seroquel may have been doing it.  He  however continued to have it even when he was not taking the Seroquel.  He has made some changes with the medication dosage schedule -the statins as well as his Biktarvy.  He reports he currently takes statin first thing in the morning and takes the Jensen later on during the day.  He reports that change definitely has helped with his jerks or sensation of falling at night.  Patient reports he continues to work with his cardiologist as well as his other providers.  He reports his cardiologist is currently referring him for a sleep study.  The sleep study is scheduled in January. I have reviewed medical records in [facility] per Dr. Darnell Level Kowalski-cardiology-dated 03/20/2019' patient had a TIA,  several months prior to which he has not had any significant TIA symptoms .  There is no evidence of stroke by CAT scan and therefore was not placed on anticoagulation.  He has remained on Crestor and aspirin for further risk reduction.  He is undergoing testing for possibility of antiphospholipid antibody with a slightly abnormal test ,with also slight increase in homocystine level.  This may be related to his TIA.  Patient had transesophageal echocardiogram showing normal LV systolic function and normal heart valves without evidence of thrombosis or other concerns.  He did have intact septum with possible slight patent foramen ovale which is minor in nature and not related to the above issue.  He has continued to have some  palpitation for which he had a Holter monitor showing that he has had some preatrial and preventricular contractions and a short run of supraventricular tachycardia.  He has had some minor symptoms of sleep apnea for which he will further need interventional assessment.'  Patient denies suicidality, homicidality or perceptual disturbances.  Patient denies any other concerns today.   Visit Diagnosis:    ICD-10-CM   1. 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  Past trials of BuSpar, Seroquel.  Past Medical History:  Past Medical History:  Diagnosis Date  . Heart murmur   . HIV (human immunodeficiency virus infection) (HCC)   . HLD (hyperlipidemia)   . Stroke [facility])     Past Surgical History:  Procedure Laterality Date  . TEE WITHOUT CARDIOVERSION N/A 03/05/2019   Procedure: TRANSESOPHAGEAL ECHOCARDIOGRAM (TEE);  Surgeon: Lamar Blinks, MD;  Location: [facility] ORS;  Service: Cardiovascular;  Laterality: N/A;    Family Psychiatric History: Reviewed family psychiatric history from my progress note on 02/06/2019.  Family History:  Family History  Problem Relation Age of Onset  . Hypertension Father   . Diabetes Father   . Kidney disease Father   . Depression Father   . Hyperlipidemia Father   . Alcohol abuse Brother   . Heart disease Paternal Grandmother   . Drug abuse Cousin     Social History: Reviewed social history from my progress note on 02/06/2019. Social History   Socioeconomic History  . Marital status: Single    Spouse name: Not on file  . Number of children: 0  . Years of education: Not on file  . Highest education level: Master's degree (e.g., MA, MS, MEng, MEd, MSW, MBA)  Occupational History  . Occupation: Consulting civil engineer  Tobacco Use  . Smoking status: Never Smoker  . Smokeless tobacco: Never Used  Substance and Sexual Activity  . Alcohol use: Yes    Comment: social maybe once a week  . Drug use: Never  . Sexual activity: Yes    Birth control/protection: Condom  Other Topics Concern  . Not on file  Social History Narrative  . Not on file   Social Determinants of Health   Financial Resource Strain: Low Risk   . Difficulty of Paying Living Expenses: Not hard at all  Food Insecurity: No Food Insecurity   . Worried About Programme researcher, broadcasting/film/video in the Last Year: Never true  . Ran Out of Food in the Last Year: Never true  Transportation Needs: No Transportation Needs  . Lack of Transportation (Medical): No  . Lack of Transportation (Non-Medical): No  Physical Activity: Sufficiently Active  . Days of Exercise per Week: 6 days  . Minutes of Exercise per Session: 60 min  Stress: Stress Concern Present  . Feeling of Stress : Very much  Social Connections: Moderately Isolated  . Frequency of Communication with Friends and Family: More than three times a week  . Frequency of Social Gatherings with Friends and Family: More than three times a week  . Attends Religious Services: Never  . Active Member of Clubs or Organizations: No  . Attends Banker Meetings: Never  . Marital Status: Never married    Allergies:  No Known Allergies  Metabolic  Disorder Labs: Lab Results  Component Value Date   HGBA1C 5.6 02/12/2019   MPG 114.02 02/12/2019   No results found for: PROLACTIN Lab Results  Component Value Date   CHOL 157 02/12/2019   TRIG 135 02/12/2019   HDL 52 02/12/2019   CHOLHDL 3.0 02/12/2019   VLDL 27 02/12/2019   LDLCALC 78 02/12/2019   LDLCALC 159 (H) 11/25/2018   Lab Results  Component Value Date   TSH 1.78 02/12/2019    Therapeutic Level Labs: No results found for: LITHIUM No results found for: VALPROATE No components found for:  CBMZ  Current Medications: Current Outpatient Medications  Medication Sig Dispense Refill  . aspirin (ASPIRIN CHILDRENS) 81 MG chewable tablet Chew 1 tablet (81 mg total) by mouth daily. 120 tablet 0  . atorvastatin (LIPITOR) 20 MG tablet Take 1 tablet (20 mg total) by mouth daily. 90 tablet 3  . bictegravir-emtricitabine-tenofovir AF (BIKTARVY) 50-200-25 MG TABS tablet Take 1 tablet by mouth daily. (Patient taking differently: Take 1 tablet by mouth daily at 12 noon. ) 30 tablet 4  . Levomefolate Glucosamine (METHYLFOLATE PO) Take 1  mg by mouth daily.    . Multiple Vitamin (MULTIVITAMIN WITH MINERALS) TABS tablet Take 1 tablet by mouth daily.    Marland Kitchen. OVER THE COUNTER MEDICATION Take 700 mg by mouth at bedtime as needed (sleep). Passion flower otc supplement    . QUEtiapine (SEROQUEL) 50 MG tablet Take 1 tablet (50 mg total) by mouth at bedtime. 90 tablet 0  . Tetrahydrozoline HCl (VISINE OP) Place 1 drop into both eyes daily as needed (dry eyes).    Marland Kitchen. tiZANidine (ZANAFLEX) 4 MG tablet Take 4 mg by mouth daily as needed (pinched nerve).     . traZODone (DESYREL) 50 MG tablet Take 0.5 tablets (25 mg total) by mouth at bedtime as needed for sleep. 45 tablet 0   No current facility-administered medications for this visit.     Musculoskeletal: Strength & Muscle Tone: UTA Gait & Station: normal Patient leans: N/A  Psychiatric Specialty Exam: Review of Systems  Psychiatric/Behavioral: Positive for sleep disturbance.       Mood swings   All other systems reviewed and are negative.   There were no vitals taken for this visit.There is no height or weight on file to calculate BMI.  General Appearance: Casual  Eye Contact:  Fair  Speech:  Clear and Coherent  Volume:  Normal  Mood:  Euthymic  Affect:  Congruent  Thought Process:  Goal Directed and Descriptions of Associations: Intact  Orientation:  Full (Time, Place, and Person)  Thought Content: Logical   Suicidal Thoughts:  No  Homicidal Thoughts:  No  Memory:  Immediate;   Fair Recent;   Fair Remote;   Fair  Judgement:  Fair  Insight:  Fair  Psychomotor Activity:  Normal  Concentration:  Concentration: Fair and Attention Span: Fair  Recall:  FiservFair  Fund of Knowledge: Fair  Language: Fair  Akathisia:  No  Handed:  Right  AIMS (if indicated): denies tremors, rigidity  Assets:  Communication Skills Desire for Improvement Social Support  ADL's:  Intact  Cognition: WNL  Sleep:  Poor   Screenings: AUDIT     [facility]  Alcohol Use Disorder Identification Test Final Score (AUDIT)  3    PHQ2-9     [facility]  from 11/25/2018 in [facility] Office Visit from 10/07/2018 in [facility]  PHQ-2 Total Score  0  PHQ-9 Total Score  Assessment and Plan: Darrell Kennedy is a 38 year old Caucasian male, single, student, lives in [place] with his parents, has a history of bipolar disorder, hyperlipidemia, HIV positive, anticardiolipin antibody slightly elevated, was evaluated by telemedicine today.  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  Patient with psychosocial stressors of the current pandemic, his own health issues and relationship struggles.  Patient continues to struggle with sleep as well as mild mood swings.  Patient will benefit from medication readjustment .  Plan as noted below.  Plan Bipolar disorder type II-improving Restart Seroquel 50 mg p.o. nightly Hydroxyzine 10 mg p.o. daily as needed for anxiety attacks  Insomnia-unstable Restart Seroquel 50 mg p.o. nightly Trazodone 25 mg p.o. nightly as needed-only if he cannot sleep just on the Seroquel.  I have reviewed medical records in [facility] per Dr. Gwen Pounds as summarized above.  Pending sleep study-he has home sleep study scheduled in January.  Follow-up in clinic in 1 month or sooner if needed.  I have spent atleast 25 minutes non face to face with patient today. More than 50 % of the time was spent for psychoeducation and supportive psychotherapy and care coordination. This note was generated in part or whole with voice recognition software. Voice recognition is usually quite accurate but there are transcription errors that can and very often do  occur. I apologize for any typographical errors that were not detected and corrected.      Jomarie Longs, MD 03/27/2019, 5:29 PM

## 2019-03-27 NOTE — Progress Notes (Signed)
Name: Darrell Kennedy   MRN: [id number]    DOB: 30-May-1980   Date:03/27/2019       Progress Note  Subjective  Chief Complaint  Chief Complaint  Patient presents with  . Annual Exam  . Follow-up    HPI  Patient presents for annual CPE and follow up.  - HLD - taking  crestor and follow up LDL was 78; initially was 914.  His insurance is no longer covering crestor, so we will switch to atorvastatin.  Denies myalgias or chest pain.  - Bipolar type II: He is off of his psychiatric medications right now, tried trazodone for sleep but it did not help. He has appointment with Dr. Elna Kennedy to follow up and discuss medication changes - today.  He is having ongoing difficulty falling asleep, getting less sleep than usual but is sleeping a few hours a night.   Hx TIA?: Seeing Dr. Sherryll Kennedy with neurology, Dr. Orlie Kennedy with hematology.  Waiting to repeat labs to see if homocystine is still elevated and/or if he has anti-phospholipid syndrome.  Taking methylpholate supplement.  No recent stroke like symptoms.  - Palpitations: Seeing Dr. Gwen Kennedy - wore holter monitor and had TEE.  He also has sleep study at home is planning for this in January 2020.  Taking ASA daily.  - HIV +: He is taking Biktarvy, seeing Dr. Rivka Kennedy and doing well on current medication.  No recent infections.   USPSTF grade A and B recommendations:  Diet: Balanced - plenty of fiber, protein, and fruits.vegetables Exercise: Has been more sedentary since his possible TIA, slowly re-entering his exercise regimen.  Depression: phq 9 is negative Depression screen Darrell Kennedy 2/9 03/27/2019 02/20/2019 11/25/2018 10/07/2018  Decreased Interest 0 Down, Depressed, Hopeless 0 PHQ - 2 Score 0 Altered sleeping 0  Tired, decreased energy 0 2 1 0  Change in appetite 0 0 1 0  Feeling bad or failure about yourself  0 1 1 0  Trouble concentrating 0 2 2 0  Moving slowly or fidgety/restless 0 0 1 0  Suicidal thoughts 0  0 0 0  PHQ-9 Score Difficult doing work/chores Not difficult at all Somewhat difficult Somewhat difficult Not difficult at all    Hypertension:  BP Readings from Last 3 Encounters:  03/27/19 120/80  03/20/19 116/75  03/12/19 136/72    Obesity: Wt Readings from Last 3 Encounters:  03/27/19 163 lb 14.4 oz (74.3 kg)  03/20/19 164 lb 3.2 oz (74.5 kg)  03/12/19 167 lb 3.2 oz (75.8 kg)   BMI Readings from Last 3 Encounters:  03/27/19 24.92 kg/m  03/20/19 24.97 kg/m  03/12/19 25.42 kg/m    Lipids:  Lab Results  Component Value Date   CHOL 157 02/12/2019   CHOL 231 (H) 11/25/2018   CHOL 228 (H) 09/10/2018   Lab Results  Component Value Date   HDL 52 02/12/2019   HDL 45 11/25/2018   HDL 46 09/10/2018   Lab Results  Component Value Date   LDLCALC 78 02/12/2019   LDLCALC 159 (H) 11/25/2018   LDLCALC 158 (H) 09/10/2018   Lab Results  Component Value Date   TRIG 135 02/12/2019   TRIG 138 11/25/2018   TRIG 122 09/10/2018   Lab Results  Component Value Date   CHOLHDL 3.0 02/12/2019   CHOLHDL 5.1 (H) 11/25/2018   CHOLHDL 5.0 09/10/2018   No results found for:  LDLDIRECT Glucose:  Glucose, Bld  Date Value Ref Range Status  03/20/2019 105 (H) 70 - 99 mg/dL Final  02/12/2019 118 (H) 70 - 99 mg/dL Final  09/10/2018 97 70 - 99 mg/dL Final      Office Visit from 03/27/2019 in [facility]  AUDIT-C Score  0     Single STD testing and prevention (HIV/chl/gon/syphilis): HIV+ - has ongoing care as above.  We will recheck RPR and gonorrhea/chlamydia today per his request; no new partners. Hep C: Hep C negative January 2020  Skin cancer: No concerning lesions; discussed monitoring for atypical lesions Colorectal cancer: Denies family or personal history of colorectal cancer, no changes in BM's - no blood in stool, dark and tarry stool, mucus in stool, or constipation/diarrhea.  Prostate cancer/Testicular Cancer: No family history;  discussed monitoring for testicular abnormalities and changes in urinary symptoms.   Lung cancer: Never smoker. Low Dose CT Chest recommended if Age 80-80 years, 30 pack-year currently smoking OR have quit w/in 15years. Patient does not qualify.   AAA: N/A The USPSTF recommends one-time screening with ultrasonography in men ages 34 to 40 years who have ever smoked ECG:  Seeing Cardiology - see above. Had recent EEG.  Advanced Care Planning: A voluntary discussion about advance care planning including the explanation and discussion of advance directives.  Discussed health care proxy and Living will, and the patient was able to identify a health care proxy as Darrell Kennedy.  Patient does not have a living will at present time. If patient does have living will, I have requested they bring this to the clinic to be scanned in to their chart.  Patient Active Problem List   Diagnosis Date Noted  . 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 Bipolar 2 disorder ([place]) 03/03/2019  . 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 Palpitations 02/20/2019  . Precordial pain 02/20/2019  . TIA (transient ischemic attack) 02/13/2019  . Bipolar I disorder, most recent episode (or current) manic ([place]) 02/06/2019  . At risk for long QT syndrome 02/06/2019  . Mood disorder ([place]) 11/25/2018  . HIV infection ([place]) 10/07/2018    Past Surgical History:  Procedure Laterality Date  . TEE WITHOUT CARDIOVERSION N/A 03/05/2019   Procedure: TRANSESOPHAGEAL ECHOCARDIOGRAM (TEE);  Surgeon: Darrell Skains, MD;  Location: [facility] ORS;  Service: Cardiovascular;  Laterality: N/A;    Family History  Problem Relation Age of Onset  . Hypertension Father   . Diabetes Father   . Kidney disease Father   . Depression Father   . Hyperlipidemia Father   . Alcohol abuse Brother   . Heart disease Paternal Grandmother   . Drug abuse Cousin     Social History   Socioeconomic History  . Marital status: Single     Spouse name: Not on file  . Number of children: 0  . Years of education: Not on file  . Highest education level: Master's degree (e.g., MA, MS, MEng, MEd, MSW, MBA)  Occupational History  . Occupation: Ship broker  Tobacco Use  . Smoking status: Never Smoker  . Smokeless tobacco: Never Used  Substance and Sexual Activity  . Alcohol use: Yes    Comment: social maybe once a week  . Drug use: Never  . Sexual activity: Yes    Birth control/protection: Condom  Other Topics Concern  . Not on file  Social History Narrative  . Not on file   Social Determinants of Health   Financial Resource Strain: Low Risk   .  Difficulty of Paying Living Expenses: Not hard at all  Food Insecurity: No Food Insecurity  . Worried About Programme researcher, broadcasting/film/video in the Last Year: Never true  . Ran Out of Food in the Last Year: Never true  Transportation Needs: No Transportation Needs  . Lack of Transportation (Medical): No  . Lack of Transportation (Non-Medical): No  Physical Activity: Sufficiently Active  . Days of Exercise per Week: 6 days  . Minutes of Exercise per Session: 60 min  Stress: Stress Concern Present  . Feeling of Stress : Very much  Social Connections: Moderately Isolated  . Frequency of Communication with Friends and Family: More than three times a week  . Frequency of Social Gatherings with Friends and Family: More than three times a week  . Attends Religious Services: Never  . Active Member of Clubs or Organizations: No  . Attends Banker Meetings: Never  . Marital Status: Never married  Intimate Partner Violence: Not At Risk  . Fear of Current or Ex-Partner: No  . Emotionally Abused: No  . Physically Abused: No  . Sexually Abused: No     Current Outpatient Medications:  .  aspirin (ASPIRIN CHILDRENS) 81 MG chewable tablet, Chew 1 tablet (81 mg total) by mouth daily., Disp: 120 tablet, Rfl: 0 .  bictegravir-emtricitabine-tenofovir AF (BIKTARVY) 50-200-25 MG TABS tablet,  Take 1 tablet by mouth daily. (Patient taking differently: Take 1 tablet by mouth daily at 12 noon. ), Disp: 30 tablet, Rfl: 4 .  Levomefolate Glucosamine (METHYLFOLATE PO), Take 1 mg by mouth daily., Disp: , Rfl:  .  Multiple Vitamin (MULTIVITAMIN WITH MINERALS) TABS tablet, Take 1 tablet by mouth daily., Disp: , Rfl:  .  rosuvastatin (CRESTOR) 10 MG tablet, Take 1 tablet (10 mg total) by mouth daily. (Patient taking differently: Take 10 mg by mouth daily at 12 noon. ), Disp: 90 tablet, Rfl: 3 .  Tetrahydrozoline HCl (VISINE OP), Place 1 drop into both eyes daily as needed (dry eyes)., Disp: , Rfl:  .  tiZANidine (ZANAFLEX) 4 MG tablet, Take 4 mg by mouth daily as needed (pinched nerve). , Disp: , Rfl:  .  traZODone (DESYREL) 50 MG tablet, Take 0.5-1.5 tablets (25-75 mg total) by mouth at bedtime as needed for sleep., Disp: 45 tablet, Rfl: 0 .  OVER THE COUNTER MEDICATION, Take 700 mg by mouth at bedtime as needed (sleep). Passion flower otc supplement, Disp: , Rfl:   Allergies  Allergen Reactions  . Quetiapine Other (See Comments) and Palpitations    Patient stated he had an episode that his body was jerking himself awake   . Seroquel [Quetiapine Fumarate] Palpitations    Couldn't fall asleep, would wake up right before falling fully asleep with palpitations      ROS  Constitutional: Negative for fever or weight change.  Respiratory: Negative for cough and shortness of breath.   Cardiovascular: Negative for chest pain; occasional palpitations.  Gastrointestinal: Negative for abdominal pain, no bowel changes.  Musculoskeletal: Negative for gait problem or joint swelling.  Skin: Negative for rash.  Neurological: Negative for dizziness or headache.  No other specific complaints in a complete review of systems (except as listed in HPI above).  Objective  Vitals:   03/27/19 0954  BP: 120/80  Pulse: 100  Resp: 16  Temp: (!) 97.3 F (36.3 C)  TempSrc: Temporal  SpO2: 94%  Weight:  163 lb 14.4 oz (74.3 kg)  Height:  (1.727 m)    Body mass  index is 24.92 kg/m.  Physical Exam  Constitutional: Patient appears well-developed and well-nourished. No distress.  HENT: Head: Normocephalic and atraumatic. Ears: B TMs ok, no erythema or effusion; Nose: Nose normal. Mouth/Throat: Oropharynx is clear and moist. No oropharyngeal exudate.  Eyes: Conjunctivae and EOM are normal. Pupils are equal, round, and reactive to light. No scleral icterus.  Neck: Normal range of motion. Neck supple. No JVD present. No thyromegaly present.  Cardiovascular: Normal rate, regular rhythm and normal heart sounds.  No murmur heard. No BLE edema. Pulmonary/Chest: Effort normal and breath sounds normal. No respiratory distress. Abdominal: Soft. Bowel sounds are normal, no distension. There is no tenderness. no masses MALE GENITALIA: Deferred RECTAL: Deferred Musculoskeletal: Normal range of motion, no joint effusions. No gross deformities Neurological: he is alert and oriented to person, place, and time. No cranial nerve deficit. Coordination, balance, strength, speech and gait are normal.  Skin: Skin is warm and dry. No rash noted. No erythema.  Psychiatric: Patient has a normal mood and affect. behavior is normal. Judgment and thought content normal.  Recent Results (from the past 2160 hour(s))  TSH     Status: None   Collection Time: 02/12/19 12:00 AM  Result Value Ref Range   TSH 1.78 0.40 - 4.50 mIU/L  CBC with Differential     Status: Abnormal   Collection Time: 02/12/19  9:33 PM  Result Value Ref Range   WBC 9.1 4.0 - 10.5 K/uL   RBC 4.92 4.22 - 5.81 MIL/uL   Hemoglobin 14.3 13.0 - 17.0 g/dL   HCT 16.1 09.6 - 04.5 %   MCV 87.4 80.0 - 100.0 fL   MCH 29.1 26.0 - 34.0 pg   MCHC 33.3 30.0 - 36.0 g/dL   RDW 40.9 81.1 - 91.4 %   Platelets 307 150 - 400 K/uL   nRBC 0.0 0.0 - 0.2 %   Neutrophils Relative % 43 %   Neutro Abs 3.9 1.7 - 7.7 K/uL   Lymphocytes Relative 46 %   Lymphs  Abs 4.2 (H) 0.7 - 4.0 K/uL   Monocytes Relative 7 %   Monocytes Absolute 0.6 0.1 - 1.0 K/uL   Eosinophils Relative 3 %   Eosinophils Absolute 0.3 0.0 - 0.5 K/uL   Basophils Relative 1 %   Basophils Absolute 0.1 0.0 - 0.1 K/uL   Immature Granulocytes 0 %   Abs Immature Granulocytes 0.03 0.00 - 0.07 K/uL    Comment: Performed at [facility], [address] [place], [place] [address]  Comprehensive metabolic panel     Status: Abnormal   Collection Time: 02/12/19  9:33 PM  Result Value Ref Range   Sodium 136 135 - 145 mmol/L   Potassium 3.5 3.5 - 5.1 mmol/L   Chloride 100 98 - 111 mmol/L   CO2 27 22 - 32 mmol/L   Glucose, Bld 118 (H) 70 - 99 mg/dL   BUN 18 6 - 20 mg/dL   Creatinine, Ser 6.21 (H) 0.61 - 1.24 mg/dL   Calcium 9.2 8.9 - 30.8 mg/dL   Total Protein 7.6 6.5 - 8.1 g/dL   Albumin 4.3 3.5 - 5.0 g/dL   AST 43 (H) 15 - 41 U/L   ALT 28 0 - 44 U/L   Alkaline Phosphatase 59 38 - 126 U/L   Total Bilirubin 0.6 0.3 - 1.2 mg/dL   GFR calc non Af Amer >60 >60 mL/min   GFR calc Af Amer >60 >60 mL/min   Anion gap 9 5 -  15    Comment: Performed at [facility], [address] [place], [place] [address]  Troponin I (High Sensitivity)     Status: None   Collection Time: 02/12/19  9:33 PM  Result Value Ref Range   Troponin I (High Sensitivity) 4 <18 ng/L    Comment: (NOTE) Elevated high sensitivity troponin I (hsTnI) values and significant  changes across serial measurements may suggest ACS but many other  chronic and acute conditions are known to elevate hsTnI results.  Refer to the "Links" section for chest pain algorithms and additional  guidance. Performed at [facility], [address] [place], [place] [address]   Troponin I (High Sensitivity)     Status: None   Collection Time: 02/12/19 11:29 PM  Result Value Ref Range   Troponin I (High Sensitivity) 4 <18 ng/L    Comment: (NOTE) Elevated high sensitivity troponin I (hsTnI) values and  significant  changes across serial measurements may suggest ACS but many other  chronic and acute conditions are known to elevate hsTnI results.  Refer to the "Links" section for chest pain algorithms and additional  guidance. Performed at [facility], [address] [place], [place] [address]   CK     Status: Abnormal   Collection Time: 02/12/19 11:29 PM  Result Value Ref Range   Total CK 926 (H) 49 - 397 U/L    Comment: Performed at [facility], [address] [place], [place] [address]  Hemoglobin A1c     Status: None   Collection Time: 02/12/19 11:29 PM  Result Value Ref Range   Hgb A1c MFr Bld 5.6 4.8 - 5.6 %    Comment: (NOTE) Pre diabetes:          5.7%-6.4% Diabetes:              >6.4% Glycemic control for   <7.0% adults with diabetes    Mean Plasma Glucose 114.02 mg/dL    Comment: Performed at [facility], [address] [place], [place] [address]  Lipid panel     Status: None   Collection Time: 02/12/19 11:29 PM  Result Value Ref Range   Cholesterol 157 0 - 200 mg/dL   Triglycerides 893 <810 mg/dL   HDL 52 >17 mg/dL   Total CHOL/HDL Ratio 3.0 RATIO   VLDL 27 0 - 40 mg/dL   LDL Cholesterol 78 0 - 99 mg/dL    Comment:        Total Cholesterol/HDL:CHD Risk Coronary Heart Disease Risk Table                     Men   Women  1/2 Average Risk   3.4   3.3  Average Risk       5.0   4.4  2 X Average Risk   9.6   7.1  3 X Average Risk  23.4   11.0        Use the calculated Patient Ratio above and the CHD Risk Table to determine the patient's CHD Risk.        ATP III CLASSIFICATION (LDL):  <100     mg/dL   Optimal  510-258  mg/dL   Near or Above                    Optimal  130-159  mg/dL   Borderline  527-782  mg/dL   High  >423     mg/dL  Very High Performed at [facility], [address] [place], [place] [address]   SARS CORONAVIRUS 2 (TAT 6-24 HRS) Nasopharyngeal Nasopharyngeal Swab     Status: None   Collection  Time: 02/13/19  1:26 AM   Specimen: Nasopharyngeal Swab  Result Value Ref Range   SARS Coronavirus 2 NEGATIVE NEGATIVE    Comment: (NOTE) SARS-CoV-2 target nucleic acids are NOT DETECTED. The SARS-CoV-2 RNA is generally detectable in upper and lower respiratory specimens during the acute phase of infection. Negative results do not preclude SARS-CoV-2 infection, do not rule out co-infections with other pathogens, and should not be used as the sole basis for treatment or other patient management decisions. Negative results must be combined with clinical observations, patient history, and epidemiological information. The expected result is Negative. Fact Sheet for Patients: HairSlick.no Fact Sheet for Healthcare Providers: quierodirigir.com This test is not yet approved or cleared by the [place] FDA and  has been authorized for detection and/or diagnosis of SARS-CoV-2 by FDA under an Emergency Use Authorization (EUA). This EUA will remain  in effect (meaning this test can be used) for the duration of the COVID-19 declaration under Section 56 4(b)(1) of the Act, 21 U.S.C. section 360bbb-3(b)(1), unless the authorization is terminated or revoked sooner. Performed at [facility], [address], [place], [place] [address]   ECHOCARDIOGRAM COMPLETE     Status: None   Collection Time: 02/13/19 10:57 AM  Result Value Ref Range   Weight 2,528 oz   Height 68 in   BP 114/69 mmHg  SARS CORONAVIRUS 2 (TAT 6-24 HRS) Nasopharyngeal Nasopharyngeal Swab     Status: None   Collection Time: 02/28/19  2:52 PM   Specimen: Nasopharyngeal Swab  Result Value Ref Range   SARS Coronavirus 2 NEGATIVE NEGATIVE    Comment: (NOTE) SARS-CoV-2 target nucleic acids are NOT DETECTED. The SARS-CoV-2 RNA is generally detectable in upper and lower respiratory specimens during the acute phase of infection. Negative results do not preclude  SARS-CoV-2 infection, do not rule out co-infections with other pathogens, and should not be used as the sole basis for treatment or other patient management decisions. Negative results must be combined with clinical observations, patient history, and epidemiological information. The expected result is Negative. Fact Sheet for Patients: HairSlick.no Fact Sheet for Healthcare Providers: quierodirigir.com This test is not yet approved or cleared by the [place] FDA and  has been authorized for detection and/or diagnosis of SARS-CoV-2 by FDA under an Emergency Use Authorization (EUA). This EUA will remain  in effect (meaning this test can be used) for the duration of the COVID-19 declaration under Section 56 4(b)(1) of the Act, 21 U.S.C. section 360bbb-3(b)(1), unless the authorization is terminated or revoked sooner. Performed at [facility], [address], [place], [place] [address]   T-helper cells CD4/CD8 %     Status: Abnormal   Collection Time: 03/20/19 12:05 PM  Result Value Ref Range   Absolute CD 4 Helper 697 359 - 1,519 /uL   % CD 4 Pos. Lymph. 25.8 (L) 30.8 - 58.5 %   CD3+CD8+ Cells # Bld 1,434 (H) 109 - 897 /uL   CD3+CD8+ Cells NFr Bld 53.1 (H) 12.0 - 35.5 %   CD3+CD4+ Cells/CD3+CD8+ Cells Bld 0.49 (L) 0.92 - 3.72   WBC 6.0 3.4 - 10.8 x10E3/uL   RBC 5.01 4.14 - 5.80 x10E6/uL   Hemoglobin 15.0 13.0 - 17.7 g/dL   Hematocrit 28.4 13.2 - 51.0 %  MCV 86 79 - 97 fL   MCH 29.9 26.6 - 33.0 pg   MCHC 35.0 31.5 - 35.7 g/dL   RDW 16.1 09.6 - 04.5 %   Platelets 299 150 - 450 x10E3/uL   Neutrophils 44 Not Estab. %   Lymphs 44 Not Estab. %   Monocytes 7 Not Estab. %   Eos 4 Not Estab. %   Basos 1 Not Estab. %   Neutrophils Absolute 2.6 1.4 - 7.0 x10E3/uL   Lymphocytes Absolute 2.7 0.7 - 3.1 x10E3/uL   Monocytes Absolute 0.4 0.1 - 0.9 x10E3/uL   EOS (ABSOLUTE) 0.2 0.0 - 0.4 x10E3/uL   Basophils Absolute 0.1 0.0  - 0.2 x10E3/uL   Immature Granulocytes 0 Not Estab. %   Immature Grans (Abs) 0.0 0.0 - 0.1 x10E3/uL    Comment: (NOTE) Performed At: [facility] [address] [place], [place] [id number] Jolene Schimke MD NW:[id number]   HIV-1 RNA quant-no reflex-bld     Status: None   Collection Time: 03/20/19 12:05 PM  Result Value Ref Range   HIV 1 RNA Quant 30 copies/mL    Comment: (NOTE) The reportable range for this assay is 20 to 10,000,000 copies HIV-1 RNA/mL.    LOG10 HIV-1 RNA 1.477 log10copy/mL    Comment: (NOTE) Performed At: [facility] [address] [place], [place] [id number] Jolene Schimke MD BM:[id number]   Comprehensive metabolic panel     Status: Abnormal   Collection Time: 03/20/19 12:05 PM  Result Value Ref Range   Sodium 136 135 - 145 mmol/L   Potassium 3.9 3.5 - 5.1 mmol/L   Chloride 100 98 - 111 mmol/L   CO2 27 22 - 32 mmol/L   Glucose, Bld 105 (H) 70 - 99 mg/dL   BUN 16 6 - 20 mg/dL   Creatinine, Ser 2.72 (H) 0.61 - 1.24 mg/dL   Calcium 9.6 8.9 - 53.6 mg/dL   Total Protein 8.0 6.5 - 8.1 g/dL   Albumin 4.6 3.5 - 5.0 g/dL   AST 23 15 - 41 U/L   ALT 26 0 - 44 U/L   Alkaline Phosphatase 61 38 - 126 U/L   Total Bilirubin 0.8 0.3 - 1.2 mg/dL   GFR calc non Af Amer >60 >60 mL/min   GFR calc Af Amer >60 >60 mL/min   Anion gap 9 5 - 15    Comment: Performed at [facility], [address] [place], [place] [address]     PHQ2/9: Depression screen [facility] 2/9 03/27/2019 02/20/2019 11/25/2018 10/07/2018  Decreased Interest 0 Down, Depressed, Hopeless 0 PHQ - 2 Score 0 Altered sleeping 0  Tired, decreased energy 0 2 1 0  Change in appetite 0 0 1 0  Feeling bad or failure about yourself  0 1 1 0  Trouble concentrating 0 2 2 0  Moving slowly or fidgety/restless 0 0 1 0  Suicidal thoughts 0 0 0 0  PHQ-9 Score Difficult doing work/chores Not difficult at all Somewhat difficult Somewhat difficult Not  difficult at all   Fall Risk: Fall Risk  03/27/2019 02/20/2019 11/25/2018 10/07/2018  Falls in the past year? 0 0 1 0  Number falls in past yr: 0 0 0 0  Injury with Fall? 0 0 0 0  Follow up - Falls evaluation completed Falls evaluation completed -   Assessment & Plan  1. Annual physical exam  -  Prostate cancer screening and PSA options (with potential risks and benefits of testing vs not testing) were discussed along with recent recs/guidelines. -USPSTF grade A and B recommendations reviewed with patient; age-appropriate recommendations, preventive care, screening tests, etc discussed and encouraged; healthy living encouraged; see AVS for patient education given to patient -Discussed importance of 150 minutes of physical activity weekly, eat two servings of fish weekly, eat one serving of tree nuts ( cashews, pistachios, pecans, almonds.Marland Kitchen.) every other day, eat 6 servings of fruit/vegetables daily and drink plenty of water and avoid sweet beverages.    2. Pure hypercholesterolemia - Per insurance, we will switch to atorvastatin rather than crestor.  Recheck labs at 4 month follow up. - atorvastatin (LIPITOR) 20 MG tablet; Take 1 tablet (20 mg total) by mouth daily.  Dispense: 90 tablet; Refill: 3  3. Bipolar 2 disorder (HCC) - Seeing Dr. Elna BreslowEappen later today  4. TIA (transient ischemic attack) - Seeing neurology  5. Asymptomatic HIV infection (HCC) - Seeing Dr. Rivka Saferavishankar, compliant with medications  6. Insomnia due to mental condition - Seeing Dr. Elna BreslowEappen  7. Palpitations - Dr. Gwen PoundsKowalski with cardiology  8. Elevated homocysteine 9. Anticardiolipin antibody positive - Seeing Dr. Orlie DakinFinnegan, has follow up.  10. Routine screening for STI (sexually transmitted infection) - RPR - Urine cytology ancillary only

## 2019-03-28 LAB — URINE CYTOLOGY ANCILLARY ONLY
Chlamydia: NEGATIVE
Comment: NEGATIVE
Comment: NORMAL
Neisseria Gonorrhea: NEGATIVE

## 2019-03-28 LAB — RPR: RPR Ser Ql: NONREACTIVE

## 2019-03-30 ENCOUNTER — Other Ambulatory Visit: Payer: Self-pay | Admitting: Psychiatry

## 2019-03-30 DIAGNOSIS — F5105 Insomnia due to other mental disorder: Secondary | ICD-10-CM

## 2019-04-03 ENCOUNTER — Ambulatory Visit: Payer: PRIVATE HEALTH INSURANCE | Admitting: Psychiatry

## 2019-04-06 ENCOUNTER — Other Ambulatory Visit: Payer: Self-pay | Admitting: Psychiatry

## 2019-04-06 DIAGNOSIS — F3181 Bipolar II disorder: Secondary | ICD-10-CM

## 2019-04-18 HISTORY — PX: MOUTH SURGERY: SHX715

## 2019-04-29 ENCOUNTER — Other Ambulatory Visit: Payer: Self-pay | Admitting: Psychiatry

## 2019-04-29 DIAGNOSIS — F5105 Insomnia due to other mental disorder: Secondary | ICD-10-CM

## 2019-05-02 NOTE — Progress Notes (Signed)
[facility]  [phone] [phone]) [phone]  ID: Darrell Kennedy OB: 05/24/1980  MR#: [id number]  UXL#:[id number]  Patient Care Team: Hubbard Hartshorn, FNP as PCP - General (Family Medicine)  I connected with Darrell Kennedy on 05/06/19 at  1:30 PM EST by video enabled telemedicine visit and verified that I am speaking with the correct person using two identifiers.   I discussed the limitations, risks, security and privacy concerns of performing an evaluation and management service by telemedicine and the availability of in-person appointments. I also discussed with the patient that there may be a patient responsible charge related to this service. The patient expressed understanding and agreed to proceed.   Other persons participating in the visit and their role in the encounter: Patient, MD.  Patient's location: Home. Provider's location: Clinic.  CHIEF COMPLAINT: TIA, concern for antiphospholipid syndrome.  INTERVAL HISTORY: Patient agreed to video enabled telemedicine visit for further evaluation and discussion of his laboratory work.  He currently feels well and is asymptomatic.  He has had no further neurologic complaints and is back to his baseline. He denies and recent fevers or illnesses.  He has a good appetite and denies weight loss. He has no chest pain, shortness of breath, cough, or hemoptysis. He denies any nausea, vomiting, constipation, or diarrhea.  He has no urinary complaints.  Patient offers no specific complaints today.  REVIEW OF SYSTEMS:   Review of Systems  Constitutional: Negative.  Negative for fever, malaise/fatigue and weight loss.  Respiratory: Negative.  Negative for cough, hemoptysis and shortness of breath.   Cardiovascular: Negative.  Negative for chest pain and leg swelling.  Gastrointestinal: Negative.  Negative for abdominal pain, blood in stool and melena.  Genitourinary: Negative.  Negative for frequency.   Musculoskeletal: Negative.  Negative for back pain.  Skin: Negative.  Negative for rash.  Neurological: Negative.  Negative for dizziness, speech change, focal weakness, weakness and headaches.  Psychiatric/Behavioral: Negative.  The patient is not nervous/anxious.     As per HPI. Otherwise, a complete review of systems is negative.  PAST MEDICAL HISTORY: Past Medical History:  Diagnosis Date  . Heart murmur   . HIV (human immunodeficiency virus infection) ([place])   . HLD (hyperlipidemia)   . Stroke [facility])     PAST SURGICAL HISTORY: Past Surgical History:  Procedure Laterality Date  . TEE WITHOUT CARDIOVERSION N/A 03/05/2019   Procedure: TRANSESOPHAGEAL ECHOCARDIOGRAM (TEE);  Surgeon: Corey Skains, MD;  Location: [facility] ORS;  Service: Cardiovascular;  Laterality: N/A;    FAMILY HISTORY: Family History  Problem Relation Age of Onset  . Hypertension Father   . Diabetes Father   . Kidney disease Father   . Depression Father   . Hyperlipidemia Father   . Alcohol abuse Brother   . Heart disease Paternal Grandmother   . Drug abuse Cousin     ADVANCED DIRECTIVES (Y/N):  N  HEALTH MAINTENANCE: Social History   Tobacco Use  . Smoking status: Never Smoker  . Smokeless tobacco: Never Used  Substance Use Topics  . Alcohol use: Yes    Comment: social maybe once a week  . Drug use: Never     Colonoscopy:  PAP:  Bone density:  Lipid panel:  No Known Allergies  Current Outpatient Medications  Medication Sig Dispense Refill  . aspirin (ASPIRIN CHILDRENS) 81 MG chewable tablet Chew 1 tablet (81 mg total) by mouth daily. 120 tablet 0  . atorvastatin (LIPITOR) 20 MG tablet Take 1  tablet (20 mg total) by mouth daily. 90 tablet 3  . bictegravir-emtricitabine-tenofovir AF (BIKTARVY) 50-200-25 MG TABS tablet Take 1 tablet by mouth daily. (Patient taking differently: Take 1 tablet by mouth daily at 12 noon. ) 30 tablet 4  . Levomefolate Glucosamine (METHYLFOLATE PO) Take 1 mg  by mouth daily.    . Multiple Vitamin (MULTIVITAMIN WITH MINERALS) TABS tablet Take 1 tablet by mouth daily.    . QUEtiapine (SEROQUEL) 50 MG tablet Take 1 tablet (50 mg total) by mouth at bedtime. 90 tablet 0  . tiZANidine (ZANAFLEX) 4 MG tablet Take 4 mg by mouth daily as needed (pinched nerve).     . traZODone (DESYREL) 50 MG tablet TAKE 1/2 TO 1 AND 1/2 TABLETS(25 TO 75 MG) BY MOUTH AT BEDTIME AS NEEDED FOR SLEEP 45 tablet 0   No current facility-administered medications for this visit.    OBJECTIVE: There were no vitals filed for this visit.   There is no height or weight on file to calculate BMI.    ECOG FS:0 - Asymptomatic  General: Well-developed, well-nourished, no acute distress. HEENT: Normocephalic. Neuro: Alert, answering all questions appropriately. Cranial nerves grossly intact. Psych: Normal affect.  LAB RESULTS:  Lab Results  Component Value Date   NA 136 03/20/2019   K 3.9 03/20/2019   CL 100 03/20/2019   CO2 27 03/20/2019   GLUCOSE 105 (H) 03/20/2019   BUN 16 03/20/2019   CREATININE 1.38 (H) 03/20/2019   CALCIUM 9.6 03/20/2019   PROT 8.0 03/20/2019   ALBUMIN 4.6 03/20/2019   AST 23 03/20/2019   ALT 26 03/20/2019   ALKPHOS 61 03/20/2019   BILITOT 0.8 03/20/2019   GFRNONAA >60 03/20/2019   GFRAA >60 03/20/2019    Lab Results  Component Value Date   WBC 6.0 03/20/2019   NEUTROABS 2.6 03/20/2019   HGB 15.0 03/20/2019   HCT 42.9 03/20/2019   MCV 86 03/20/2019   PLT 299 03/20/2019     STUDIES: No results found.  ASSESSMENT: TIA, concern for antiphospholipid syndrome.  PLAN:    1. TIA, concern for antiphospholipid syndrome: MRI of the brain review independently with no intracranial pathology.  He has no personal or family history of blood clots.  Anticardiolipin antibody, IgM was only mildly elevated at 13 with normal range 0-12.  This result is indeterminate.  Repeat results from yesterday are pending at time of dictation.  Without a definitive  blood clot, it is unlikely he has antiphospholipid syndrome.  Continue 81 mg aspirin daily.  If results are the same or decreased, no further follow-up is necessary.  If anticardiolipin antibody, IgM is higher we will once again repeat labs in 8 to 12 weeks.  2.  Elevated homocystine:  Likely clinically insignificant.  Continue folic acid supplementation.  Repeat results pending.  I provided 20 minutes of face-to-face video visit time during this encounter which included chart review, counseling, and coordination of care as documented above.  Patient expressed understanding and was in agreement with this plan. He also understands that He can call clinic at any time with any questions, concerns, or complaints.    Jeralyn Ruths, MD   05/06/2019 3:16 PM

## 2019-05-05 ENCOUNTER — Other Ambulatory Visit: Payer: Self-pay

## 2019-05-05 ENCOUNTER — Inpatient Hospital Stay: Payer: PRIVATE HEALTH INSURANCE | Attending: Oncology

## 2019-05-05 DIAGNOSIS — G459 Transient cerebral ischemic attack, unspecified: Secondary | ICD-10-CM | POA: Insufficient documentation

## 2019-05-05 DIAGNOSIS — E7211 Homocystinuria: Secondary | ICD-10-CM | POA: Diagnosis not present

## 2019-05-05 DIAGNOSIS — R760 Raised antibody titer: Secondary | ICD-10-CM | POA: Insufficient documentation

## 2019-05-05 DIAGNOSIS — Z7982 Long term (current) use of aspirin: Secondary | ICD-10-CM | POA: Insufficient documentation

## 2019-05-06 ENCOUNTER — Encounter: Payer: Self-pay | Admitting: Oncology

## 2019-05-06 ENCOUNTER — Inpatient Hospital Stay (HOSPITAL_BASED_OUTPATIENT_CLINIC_OR_DEPARTMENT_OTHER): Payer: PRIVATE HEALTH INSURANCE | Admitting: Oncology

## 2019-05-06 DIAGNOSIS — R760 Raised antibody titer: Secondary | ICD-10-CM | POA: Diagnosis not present

## 2019-05-06 LAB — HOMOCYSTEINE: Homocysteine: 10.3 umol/L (ref 0.0–14.5)

## 2019-05-06 NOTE — Progress Notes (Signed)
Patient prescreened for appointment. Patient has no concerns or questions.  

## 2019-05-07 ENCOUNTER — Ambulatory Visit (INDEPENDENT_AMBULATORY_CARE_PROVIDER_SITE_OTHER): Payer: PRIVATE HEALTH INSURANCE | Admitting: Psychiatry

## 2019-05-07 ENCOUNTER — Other Ambulatory Visit: Payer: Self-pay

## 2019-05-07 ENCOUNTER — Encounter: Payer: Self-pay | Admitting: Psychiatry

## 2019-05-07 DIAGNOSIS — F5105 Insomnia due to other mental disorder: Secondary | ICD-10-CM | POA: Diagnosis not present

## 2019-05-07 DIAGNOSIS — F3181 Bipolar II disorder: Secondary | ICD-10-CM

## 2019-05-07 LAB — CARDIOLIPIN ANTIBODIES, IGG, IGM, IGA
Anticardiolipin IgA: <9 APL U/mL (ref 0–11)
Anticardiolipin IgG: <9 GPL U/mL (ref 0–14)
Anticardiolipin IgM: 11 MPL U/mL (ref 0–12)

## 2019-05-07 MED ORDER — LAMOTRIGINE 25 MG PO TABS: 25.0000 mg | ORAL_TABLET | Freq: Every day | ORAL | 0 refills | Status: DC

## 2019-05-07 NOTE — Progress Notes (Signed)
Provider Location : [facility] Patient Location : Home  Virtual Visit via Video Note  I connected with Darrell Kennedy on 05/07/19 at  1:00 PM EST by a video enabled telemedicine application and verified that I am speaking with the correct person using two identifiers.   I discussed the limitations of evaluation and management by telemedicine and the availability of in person appointments. The patient expressed understanding and agreed to proceed.    I discussed the assessment and treatment plan with the patient. The patient was provided an opportunity to ask questions and all were answered. The patient agreed with the plan and demonstrated an understanding of the instructions.   The patient was advised to call back or seek an in-person evaluation if the symptoms worsen or if the condition fails to improve as anticipated.   [place] MD OP Progress Note  05/07/2019 6:11 PM Darrell Kennedy  MRN:  [id number]  Chief Complaint:  Chief Complaint    Follow-up     HPI: Darrell Kennedy is a 39 year old Caucasian male, lives in [place], has a history of bipolar disorder, hyperlipidemia, HIV positive, recent TIA was evaluated by telemedicine today.  Patient today reports he is currently struggling with mood swings.  He reports he has highs and lows and also anxiety symptoms.  The Seroquel does help to some extent.  Patient reports sleep is good.  He had sleep study done and is awaiting results.  He reports he currently does not have a lot of the jerks or sensation of falling anymore.  That has improved since he made changes with his medication time.  Patient denies any suicidality, homicidality or perceptual disturbances.  Discussed starting Lamictal.  Provided medication education.  Patient had recent appointment with his primary care providers and had labs done.  He reports his antiphospholipid as well as other levels have come down.  Patient denies any other concerns today. Visit Diagnosis:     ICD-10-CM   1. Bipolar 2 disorder (HCC)  F31.81 lamoTRIgine (LAMICTAL) 25 MG tablet   hypomanic mild  2. Insomnia due to mental condition  F51.05     Past Psychiatric History: I have reviewed past psychiatric history from my progress note on 02/06/2019.  Past trials of BuSpar, Seroquel.  Past Medical History:  Past Medical History:  Diagnosis Date  . Heart murmur   . HIV (human immunodeficiency virus infection) ([place])   . HLD (hyperlipidemia)   . Stroke [facility])     Past Surgical History:  Procedure Laterality Date  . TEE WITHOUT CARDIOVERSION N/A 03/05/2019   Procedure: TRANSESOPHAGEAL ECHOCARDIOGRAM (TEE);  Surgeon: Corey Skains, MD;  Location: [facility] ORS;  Service: Cardiovascular;  Laterality: N/A;    Family Psychiatric History: Reviewed family psychiatric history from my progress note on 02/06/2019.  Family History:  Family History  Problem Relation Age of Onset  . Hypertension Father   . Diabetes Father   . Kidney disease Father   . Depression Father   . Hyperlipidemia Father   . Alcohol abuse Brother   . Heart disease Paternal Grandmother   . Drug abuse Cousin     Social History: Reviewed social history from my progress note on 02/06/2019. Social History   Socioeconomic History  . Marital status: Single    Spouse name: Not on file  . Number of children: 0  . Years of education: Not on file  . Highest education level: Master's degree (e.g., MA, MS, MEng, MEd, MSW, MBA)  Occupational History  . Occupation: Ship broker  Tobacco Use  . Smoking status: Never Smoker  . Smokeless tobacco: Never Used  Substance and Sexual Activity  . Alcohol use: Yes    Comment: social maybe once a week  . Drug use: Never  . Sexual activity: Yes    Birth control/protection: Condom  Other Topics Concern  . Not on file  Social History Narrative  . Not on file   Social Determinants of Health   Financial Resource Strain: Low Risk   . Difficulty of Paying Living Expenses: Not hard  at all  Food Insecurity: No Food Insecurity  . Worried About Programme researcher, broadcasting/film/video in the Last Year: Never true  . Ran Out of Food in the Last Year: Never true  Transportation Needs: No Transportation Needs  . Lack of Transportation (Medical): No  . Lack of Transportation (Non-Medical): No  Physical Activity: Sufficiently Active  . Days of Exercise per Week: 6 days  . Minutes of Exercise per Session: 60 min  Stress: Stress Concern Present  . Feeling of Stress : Very much  Social Connections: Moderately Isolated  . Frequency of Communication with Friends and Family: More than three times a week  . Frequency of Social Gatherings with Friends and Family: More than three times a week  . Attends Religious Services: Never  . Active Member of Clubs or Organizations: No  . Attends Banker Meetings: Never  . Marital Status: Never married    Allergies: No Known Allergies  Metabolic Disorder Labs: Lab Results  Component Value Date   HGBA1C 5.6 02/12/2019   MPG 114.02 02/12/2019   No results found for: PROLACTIN Lab Results  Component Value Date   CHOL 157 02/12/2019   TRIG 135 02/12/2019   HDL 52 02/12/2019   CHOLHDL 3.0 02/12/2019   VLDL 27 02/12/2019   LDLCALC 78 02/12/2019   LDLCALC 159 (H) 11/25/2018   Lab Results  Component Value Date   TSH 1.78 02/12/2019    Therapeutic Level Labs: No results found for: LITHIUM No results found for: VALPROATE No components found for:  CBMZ  Current Medications: Current Outpatient Medications  Medication Sig Dispense Refill  . aspirin (ASPIRIN CHILDRENS) 81 MG chewable tablet Chew 1 tablet (81 mg total) by mouth daily. 120 tablet 0  . atorvastatin (LIPITOR) 20 MG tablet Take 1 tablet (20 mg total) by mouth daily. 90 tablet 3  . bictegravir-emtricitabine-tenofovir AF (BIKTARVY) 50-200-25 MG TABS tablet Take 1 tablet by mouth daily. (Patient taking differently: Take 1 tablet by mouth daily at 12 noon. ) 30 tablet 4  .  lamoTRIgine (LAMICTAL) 25 MG tablet Take 1 tablet (25 mg total) by mouth daily. 30 tablet 0  . Levomefolate Glucosamine (METHYLFOLATE PO) Take 1 mg by mouth daily.    . Multiple Vitamin (MULTIVITAMIN WITH MINERALS) TABS tablet Take 1 tablet by mouth daily.    . QUEtiapine (SEROQUEL) 50 MG tablet Take 1 tablet (50 mg total) by mouth at bedtime. 90 tablet 0  . tiZANidine (ZANAFLEX) 4 MG tablet Take 4 mg by mouth daily as needed (pinched nerve).     . traZODone (DESYREL) 50 MG tablet TAKE 1/2 TO 1 AND 1/2 TABLETS(25 TO 75 MG) BY MOUTH AT BEDTIME AS NEEDED FOR SLEEP 45 tablet 0   No current facility-administered medications for this visit.     Musculoskeletal: Strength & Muscle Tone: UTA Gait & Station: normal Patient leans: N/A  Psychiatric Specialty Exam: Review of Systems  Psychiatric/Behavioral: The patient is nervous/anxious.   All  other systems reviewed and are negative.   There were no vitals taken for this visit.There is no height or weight on file to calculate BMI.  General Appearance: Casual  Eye Contact:  Fair  Speech:  Clear and Coherent  Volume:  Normal  Mood:  Anxious mood swings  Affect:  Appropriate  Thought Process:  Goal Directed and Descriptions of Associations: Intact  Orientation:  Full (Time, Place, and Person)  Thought Content: Logical   Suicidal Thoughts:  No  Homicidal Thoughts:  No  Memory:  Immediate;   Fair Recent;   Fair Remote;   Fair  Judgement:  Fair  Insight:  Fair  Psychomotor Activity:  Normal  Concentration:  Concentration: Fair and Attention Span: Fair  Recall:  [employer] of Knowledge: Fair  Language: Fair  Akathisia:  No  Handed:  Right  AIMS (if indicated): Denies tremors, rigidity  Assets:  Communication Skills Desire for Improvement Housing Social Support  ADL's:  Intact  Cognition: WNL  Sleep:  Fair   Screenings: AUDIT     Office Visit from 10/07/2018 in [facility]  Alcohol Use Disorder Identification  Test Final Score (AUDIT)  3    PHQ2-9     Office Visit from 03/27/2019 in [facility] Office Visit from 02/20/2019 in [facility] Office Visit from 11/25/2018 in [facility] Office Visit from 10/07/2018 in [facility]  PHQ-2 Total Score  0  2  2  2   PHQ-9 Total Score  2  10  11  2        Assessment and Plan: is a 39 year old Caucasian male, single, [name], lives in [place] with his parents, has a history of bipolar disorder, hyperlipidemia, HIV positive, anticardiolipin antibodies elevation, was evaluated by telemedicine today.  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  Patient with psychosocial stressors of the current pandemic and his own health issues.  Patient continues to struggle with mood swings and will benefit from the following medication readjustment.  Plan as noted below.  Plan Bipolar disorder type II-improving Seroquel 50 mg p.o. nightly Add Lamictal 25 mg p.o. daily. Hydroxyzine 10 mg p.o. daily as needed for anxiety attacks  Insomnia-improving Seroquel 50 mg p.o. nightly Trazodone 25 mg p.o. nightly as needed.  I have reviewed medical records in [facility] per Dr. Consulting civil engineer dated 05/06/2019-' patient with TIA, concern for antiphospholipid syndrome, MRI of brain with no intracranial pathology.  Anticardiolipin antibody, IgM was mildly elevated at 13 with normal range 0-12.  The result is indeterminate.  Repeat results from yesterday are pending.  Without a definitive blood clot it is unlikely he has antiphospholipid syndrome.  Continue 81 mg aspirin daily.  Elevated homocystine likely insignificant.  Repeat results pending.  Reviewed labs dated 05/05/2019-anticardiolipin immunoglobulin less than 9, homocystine-10.3-within normal limits.    Pending sleep study-patient had completed-awaiting  report.  Follow-up in clinic in 4 weeks or sooner if needed.  February 17 at 1 PM  I have spent atleast 30 minutes non face to face with patient today. More than 50 % of the time was spent for preparing to see the patient ( e.g., review of test, records ), obtaining and to review and separately obtained history , ordering medications and test ,psychoeducation and supportive psychotherapy and care coordination,as well as documenting clinical information in electronic health record,interpreting results of test and communication of  results This note was generated in part or whole with voice recognition software. Voice recognition is usually quite accurate but there are transcription errors that can and very often do occur. I apologize for any typographical errors that were not detected and corrected.       Jomarie Longs, MD 05/07/2019, 6:11 PM

## 2019-05-07 NOTE — Patient Instructions (Signed)
Lamotrigine tablets What is this medicine? LAMOTRIGINE (la MOE tri jeen) is used to control seizures in adults and children with epilepsy and Lennox-Gastaut syndrome. It is also used in adults to treat bipolar disorder. This medicine may be used for other purposes; ask your health care provider or pharmacist if you have questions. COMMON BRAND NAME(S): Lamictal, Subvenite What should I tell my health care provider before I take this medicine? They need to know if you have any of these conditions:  aseptic meningitis during prior use of lamotrigine  depression  folate deficiency  kidney disease  liver disease  suicidal thoughts, plans, or attempt; a previous suicide attempt by you or a family member  an unusual or allergic reaction to lamotrigine or other seizure medications, other medicines, foods, dyes, or preservatives  pregnant or trying to get pregnant  breast-feeding How should I use this medicine? Take this medicine by mouth with a glass of water. Follow the directions on the prescription label. Do not chew these tablets. If this medicine upsets your stomach, take it with food or milk. Take your doses at regular intervals. Do not take your medicine more often than directed. A special MedGuide will be given to you by the pharmacist with each new prescription and refill. Be sure to read this information carefully each time. Talk to your pediatrician regarding the use of this medicine in children. While this drug may be prescribed for children as young as 2 years for selected conditions, precautions do apply. Overdosage: If you think you have taken too much of this medicine contact a poison control center or emergency room at once. NOTE: This medicine is only for you. Do not share this medicine with others. What if I miss a dose? If you miss a dose, take it as soon as you can. If it is almost time for your next dose, take only that dose. Do not take double or extra doses. What may  interact with this medicine?  atazanavir  carbamazepine  male hormones, including contraceptive or birth control pills  lopinavir  methotrexate  phenobarbital  phenytoin  primidone  pyrimethamine  rifampin  ritonavir  trimethoprim  valproic acid This list may not describe all possible interactions. Give your health care provider a list of all the medicines, herbs, non-prescription drugs, or dietary supplements you use. Also tell them if you smoke, drink alcohol, or use illegal drugs. Some items may interact with your medicine. What should I watch for while using this medicine? Visit your doctor or health care provider for regular checks on your progress. If you take this medicine for seizures, wear a Medic Alert bracelet or necklace. Carry an identification card with information about your condition, medicines, and doctor or health care provider. It is important to take this medicine exactly as directed. When first starting treatment, your dose will need to be adjusted slowly. It may take weeks or months before your dose is stable. You should contact your doctor or health care provider if your seizures get worse or if you have any new types of seizures. Do not stop taking this medicine unless instructed by your doctor or health care provider. Stopping your medicine suddenly can increase your seizures or their severity. This medicine may cause serious skin reactions. They can happen weeks to months after starting the medicine. Contact your health care provider right away if you notice fevers or flu-like symptoms with a rash. The rash may be red or purple and then turn into blisters or peeling   of the skin. Or, you might notice a red rash with swelling of the face, lips or lymph nodes in your neck or under your arms. You may get drowsy, dizzy, or have blurred vision. Do not drive, use machinery, or do anything that needs mental alertness until you know how this medicine affects you. To  reduce dizzy or fainting spells, do not sit or stand up quickly, especially if you are an older patient. Alcohol can increase drowsiness and dizziness. Avoid alcoholic drinks. If you are taking this medicine for bipolar disorder, it is important to report any changes in your mood to your doctor or health care provider. If your condition gets worse, you get mentally depressed, feel very hyperactive or manic, have difficulty sleeping, or have thoughts of hurting yourself or committing suicide, you need to get help from your health care provider right away. If you are a caregiver for someone taking this medicine for bipolar disorder, you should also report these behavioral changes right away. The use of this medicine may increase the chance of suicidal thoughts or actions. Pay special attention to how you are responding while on this medicine. Your mouth may get dry. Chewing sugarless gum or sucking hard candy, and drinking plenty of water may help. Contact your doctor if the problem does not go away or is severe. Women who become pregnant while using this medicine may enroll in the [place] American Antiepileptic Drug Pregnancy Registry by calling 1-888-233-2334. This registry collects information about the safety of antiepileptic drug use during pregnancy. This medicine may cause a decrease in folic acid. You should make sure that you get enough folic acid while you are taking this medicine. Discuss the foods you eat and the vitamins you take with your health care provider. What side effects may I notice from receiving this medicine? Side effects that you should report to your doctor or health care professional as soon as possible:  allergic reactions like skin rash, itching or hives, swelling of the face, lips, or tongue  changes in vision  depressed mood  elevated mood, decreased need for sleep, racing thoughts, impulsive behavior  loss of balance or coordination  mouth sores  rash, fever, and  swollen lymph nodes  redness, blistering, peeling or loosening of the skin, including inside the mouth  right upper belly pain  seizures  severe muscle pain  signs and symptoms of aseptic meningitis such as stiff neck and sensitivity to light, headache, drowsiness, fever, nausea, vomiting, rash  signs of infection - fever or chills, cough, sore throat, pain or difficulty passing urine  suicidal thoughts or other mood changes  swollen lymph nodes  trouble walking  unusual bruising or bleeding  unusually weak or tired  yellowing of the eyes or skin Side effects that usually do not require medical attention (report to your doctor or health care professional if they continue or are bothersome):  diarrhea  dizziness  dry mouth  stuffy nose  tiredness  tremors  trouble sleeping This list may not describe all possible side effects. Call your doctor for medical advice about side effects. You may report side effects to FDA at 1-800-FDA-1088. Where should I keep my medicine? Keep out of reach of children. Store at room temperature between 15 and 30 degrees C (59 and 86 degrees F). Throw away any unused medicine after the expiration date. NOTE: This sheet is a summary. It may not cover all possible information. If you have questions about this medicine, talk to your doctor,   pharmacist, or health care provider.  2020 Elsevier/Gold Standard (2018-07-05 15:03:40)  

## 2019-05-08 ENCOUNTER — Telehealth: Payer: Self-pay | Admitting: Emergency Medicine

## 2019-05-08 NOTE — Telephone Encounter (Signed)
Called patient to let him know that the rest of the lab results we were waiting on came back WNL. Told pt that as discussed at his visit earlier this week, he should continue to take his aspirin daily and that no f/u is required with the CC. Pt verbalized understanding and didn't have any further questions or concerns.

## 2019-05-15 DIAGNOSIS — Q2112 Patent foramen ovale: Secondary | ICD-10-CM | POA: Insufficient documentation

## 2019-05-15 DIAGNOSIS — Q211 Atrial septal defect: Secondary | ICD-10-CM | POA: Insufficient documentation

## 2019-06-01 ENCOUNTER — Other Ambulatory Visit: Payer: Self-pay | Admitting: Psychiatry

## 2019-06-01 DIAGNOSIS — F5105 Insomnia due to other mental disorder: Secondary | ICD-10-CM

## 2019-06-03 ENCOUNTER — Other Ambulatory Visit: Payer: Self-pay | Admitting: Psychiatry

## 2019-06-03 DIAGNOSIS — F3181 Bipolar II disorder: Secondary | ICD-10-CM

## 2019-06-04 ENCOUNTER — Ambulatory Visit (INDEPENDENT_AMBULATORY_CARE_PROVIDER_SITE_OTHER): Payer: PRIVATE HEALTH INSURANCE | Admitting: Psychiatry

## 2019-06-04 ENCOUNTER — Other Ambulatory Visit: Payer: Self-pay

## 2019-06-04 ENCOUNTER — Encounter: Payer: Self-pay | Admitting: Psychiatry

## 2019-06-04 DIAGNOSIS — F5105 Insomnia due to other mental disorder: Secondary | ICD-10-CM | POA: Diagnosis not present

## 2019-06-04 DIAGNOSIS — F3181 Bipolar II disorder: Secondary | ICD-10-CM

## 2019-06-04 MED ORDER — QUETIAPINE FUMARATE 100 MG PO TABS: 100.0000 mg | ORAL_TABLET | Freq: Every day | ORAL | 0 refills | Status: DC

## 2019-06-04 MED ORDER — LAMOTRIGINE 25 MG PO TABS: 50.0000 mg | ORAL_TABLET | ORAL | 1 refills | Status: DC

## 2019-06-04 NOTE — Progress Notes (Signed)
Provider Location : [facility] Patient Location : Home   Virtual Visit via Video Note  I connected with Darrell Kennedy on 06/04/19 at  1:00 PM EST by a video enabled telemedicine application and verified that I am speaking with the correct person using two identifiers.   I discussed the limitations of evaluation and management by telemedicine and the availability of in person appointments. The patient expressed understanding and agreed to proceed.   I discussed the assessment and treatment plan with the patient. The patient was provided an opportunity to ask questions and all were answered. The patient agreed with the plan and demonstrated an understanding of the instructions.   The patient was advised to call back or seek an in-person evaluation if the symptoms worsen or if the condition fails to improve as anticipated.   [facility] MD OP Progress Note  06/04/2019 4:25 PM EUAL LINDSTROM  MRN:  [id number]  Chief Complaint:  Chief Complaint    Follow-up     HPI: Darrell Kennedy is a 39 year old Caucasian male, lives in [place], has a history of bipolar disorder, hyperlipidemia, HIV positive, recent TIA was evaluated by telemedicine today.  Patient today reports he is currently making progress on the current combination of medications.  He reports he increased the Seroquel to 100 mg since the 50 mg did not help much with the sleep.  He reports it helped for a few days and then stopped working.  However the 100 mg is very beneficial.  He denies side effects to it.  He is currently not taking the trazodone anymore.  He reports he feels much better with regards to his mood symptoms 5 days a week.  He however reports there is still 2 days a week when he feels low and down.  He is also taking an over-the-counter supplement Levium which he reports may have improved his cognitive ability as well as his mood symptoms.  Patient denies any suicidality, homicidality or perceptual disturbances.  He continues to  be in psychotherapy sessions.  He reports he currently is getting therapy sessions due to a wellness program with his health insurance plan called [employer].  Patient denies any other concerns today.  Visit Diagnosis:    ICD-10-CM   1. Bipolar 2 disorder (HCC)  F31.81 QUEtiapine (SEROQUEL) 100 MG tablet    lamoTRIgine (LAMICTAL) 25 MG tablet   mixed mild  2. Insomnia due to mental condition  F51.05     Past Psychiatric History: I have reviewed past psychiatric history from my progress note on 02/06/2019.  Past trials of BuSpar, Seroquel  Past Medical History:  Past Medical History:  Diagnosis Date  . Heart murmur   . HIV (human immunodeficiency virus infection) ([place])   . HLD (hyperlipidemia)   . Stroke [facility])     Past Surgical History:  Procedure Laterality Date  . TEE WITHOUT CARDIOVERSION N/A 03/05/2019   Procedure: TRANSESOPHAGEAL ECHOCARDIOGRAM (TEE);  Surgeon: Corey Skains, MD;  Location: [facility] ORS;  Service: Cardiovascular;  Laterality: N/A;    Family Psychiatric History: I have reviewed family psychiatric history from my progress note on 02/06/2019.  Family History:  Family History  Problem Relation Age of Onset  . Hypertension Father   . Diabetes Father   . Kidney disease Father   . Depression Father   . Hyperlipidemia Father   . Alcohol abuse Brother   . Heart disease Paternal Grandmother   . Drug abuse Cousin     Social History: Reviewed social history from  my progress note on 02/06/2019. Social History   Socioeconomic History  . Marital status: Single    Spouse name: Not on file  . Number of children: 0  . Years of education: Not on file  . Highest education level: Master's degree (e.g., MA, MS, MEng, MEd, MSW, MBA)  Occupational History  . Occupation: Consulting civil engineer  Tobacco Use  . Smoking status: Never Smoker  . Smokeless tobacco: Never Used  Substance and Sexual Activity  . Alcohol use: Yes    Comment: social maybe once a week  . Drug use: Never  .  Sexual activity: Yes    Birth control/protection: Condom  Other Topics Concern  . Not on file  Social History Narrative  . Not on file   Social Determinants of Health   Financial Resource Strain: Low Risk   . Difficulty of Paying Living Expenses: Not hard at all  Food Insecurity: No Food Insecurity  . Worried About Programme researcher, broadcasting/film/video in the Last Year: Never true  . Ran Out of Food in the Last Year: Never true  Transportation Needs: No Transportation Needs  . Lack of Transportation (Medical): No  . Lack of Transportation (Non-Medical): No  Physical Activity: Sufficiently Active  . Days of Exercise per Week: 6 days  . Minutes of Exercise per Session: 60 min  Stress: Stress Concern Present  . Feeling of Stress : Very much  Social Connections: Moderately Isolated  . Frequency of Communication with Friends and Family: More than three times a week  . Frequency of Social Gatherings with Friends and Family: More than three times a week  . Attends Religious Services: Never  . Active Member of Clubs or Organizations: No  . Attends Banker Meetings: Never  . Marital Status: Never married    Allergies: No Known Allergies  Metabolic Disorder Labs: Lab Results  Component Value Date   HGBA1C 5.6 02/12/2019   MPG 114.02 02/12/2019   No results found for: PROLACTIN Lab Results  Component Value Date   CHOL 157 02/12/2019   TRIG 135 02/12/2019   HDL 52 02/12/2019   CHOLHDL 3.0 02/12/2019   VLDL 27 02/12/2019   LDLCALC 78 02/12/2019   LDLCALC 159 (H) 11/25/2018   Lab Results  Component Value Date   TSH 1.78 02/12/2019    Therapeutic Level Labs: No results found for: LITHIUM No results found for: VALPROATE No components found for:  CBMZ  Current Medications: Current Outpatient Medications  Medication Sig Dispense Refill  . aspirin (ASPIRIN CHILDRENS) 81 MG chewable tablet Chew 1 tablet (81 mg total) by mouth daily. 120 tablet 0  . atorvastatin (LIPITOR) 20 MG  tablet Take 1 tablet (20 mg total) by mouth daily. 90 tablet 3  . atorvastatin (LIPITOR) 20 MG tablet     . bictegravir-emtricitabine-tenofovir AF (BIKTARVY) 50-200-25 MG TABS tablet Take 1 tablet by mouth daily. (Patient taking differently: Take 1 tablet by mouth daily at 12 noon. ) 30 tablet 4  . bictegravir-emtricitabine-tenofovir AF (BIKTARVY) 50-200-25 MG TABS tablet TK 1 T PO D    . lamoTRIgine (LAMICTAL) 25 MG tablet Take 2-3 tablets (50-75 mg total) by mouth as directed. Take 2 tablets ( 50 mg) daily for 2 weeks and increase to 3 tablets after that. 90 tablet 1  . Levomefolate Glucosamine (METHYLFOLATE PO) Take 1 mg by mouth daily.    . Multiple Vitamin (MULTIVITAMIN WITH MINERALS) TABS tablet Take 1 tablet by mouth daily.    Marland Kitchen OVER THE COUNTER MEDICATION  Levium anxiety supplement    . rosuvastatin (CRESTOR) 10 MG tablet     . tiZANidine (ZANAFLEX) 4 MG tablet Take 4 mg by mouth daily as needed (pinched nerve).     Marland Kitchen tiZANidine (ZANAFLEX) 4 MG tablet Take by mouth.    . traZODone (DESYREL) 50 MG tablet TAKE 1/2 TO 1 AND 1/2 TABLETS(25 TO 75 MG) BY MOUTH AT BEDTIME AS NEEDED FOR SLEEP 45 tablet 0  . QUEtiapine (SEROQUEL) 100 MG tablet Take 1 tablet (100 mg total) by mouth at bedtime. 90 tablet 0   No current facility-administered medications for this visit.     Musculoskeletal: Strength & Muscle Tone: UTA Gait & Station: normal Patient leans: N/A  Psychiatric Specialty Exam: Review of Systems  Psychiatric/Behavioral: Positive for dysphoric mood.  All other systems reviewed and are negative.   There were no vitals taken for this visit.There is no height or weight on file to calculate BMI.  General Appearance: Casual  Eye Contact:  Fair  Speech:  Clear and Coherent  Volume:  Normal  Mood:  Depressed  Affect:  Congruent  Thought Process:  Goal Directed and Descriptions of Associations: Intact  Orientation:  Full (Time, Place, and Person)  Thought Content: Logical   Suicidal  Thoughts:  No  Homicidal Thoughts:  No  Memory:  Immediate;   Fair Recent;   Fair Remote;   Fair  Judgement:  Fair  Insight:  Fair  Psychomotor Activity:  Normal  Concentration:  Concentration: Fair and Attention Span: Fair  Recall:  [employer] of Knowledge: Fair  Language: Fair  Akathisia:  No  Handed:  Right  AIMS (if indicated): Denies tremors, rigidity  Assets:  Communication Skills Desire for Improvement Housing Social Support Talents/Skills Transportation  ADL's:  Intact  Cognition: WNL  Sleep:  Fair   Screenings: AUDIT     Office Visit from 10/07/2018 in [facility]  Alcohol Use Disorder Identification Test Final Score (AUDIT)  3    PHQ2-9     Office Visit from 03/27/2019 in [facility] Office Visit from 02/20/2019 in [facility] Office Visit from 11/25/2018 in [facility] Office Visit from 10/07/2018 in [facility]  PHQ-2 Total Score  0  2  2  2   PHQ-9 Total Score  2  10  11  2        Assessment and Plan: is a 39 year old Caucasian male, single, [name], lives in [place] with his parents, has a history of bipolar disorder, hyperlipidemia, HIV positive, anticardiolipin antibodies, evaluated by telemedicine today.  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  Patient is currently making progress with regards to his mood symptoms however continues to have depressive symptoms on and off and will benefit from medication readjustment.  Plan as noted below.  Plan Bipolar disorder type II-improving Seroquel 100 mg p.o. nightly Increase lamotrigine to 50 mg p.o. daily for 2 weeks and then to 75 mg p.o. daily after that. Hydroxyzine 10 mg p.o. daily as needed for anxiety attacks  Insomnia-improved Seroquel 100 mg p.o. nightly. Trazodone 25 mg at bedtime as needed available  however he has not been using it.   Continue psychotherapy sessions with Consulting civil engineer program.  I have reviewed medical records in [facility] per Dr. Derby from 05/12/2019-cardiology-' patient returns for further evaluation and treatment option-neurologic abnormality and possible TIA in the past.  Holter monitor did not show any evidence of rhythm disturbance of A. fib.  Patient had sleep study done due to neurological issue during sleep and weakness and fatigue.  He has had sleep study showing an AHI of 1 and an RDI of 6.  There is no actual sleep apnea of concern.  Patient has had esophageal echocardiogram showing a slight patent foramen ovale which can be treated by aspirin alone and likely was not associated with symptoms of above but will be treated.'  Follow-up in clinic in 3 to 4 weeks or sooner if needed.  March 17 at 1:30 PM  I have spent atleast 30 minutes non face to face with patient today. More than 50 % of the time was spent for preparing to see the patient ( e.g., review of test, records ), obtaining and to review and separately obtained history , ordering medications and test ,psychoeducation and supportive psychotherapy and care coordination,as well as documenting clinical information in electronic health record. This note was generated in part or whole with voice recognition software. Voice recognition is usually quite accurate but there are transcription errors that can and very often do occur. I apologize for any typographical errors that were not detected and corrected.        Jomarie Longs, MD 06/04/2019, 4:25 PM

## 2019-06-23 ENCOUNTER — Other Ambulatory Visit: Payer: Self-pay | Admitting: Psychiatry

## 2019-06-23 DIAGNOSIS — F3181 Bipolar II disorder: Secondary | ICD-10-CM

## 2019-07-01 ENCOUNTER — Other Ambulatory Visit: Payer: Self-pay | Admitting: Psychiatry

## 2019-07-01 DIAGNOSIS — F5105 Insomnia due to other mental disorder: Secondary | ICD-10-CM

## 2019-07-01 MED ORDER — TRAZODONE HCL 50 MG PO TABS: 25.0000 mg | ORAL_TABLET | Freq: Every evening | ORAL | 1 refills | Status: DC | PRN

## 2019-07-01 NOTE — Telephone Encounter (Signed)
I have sent trazodone to pharmacy. 

## 2019-07-02 ENCOUNTER — Other Ambulatory Visit: Payer: Self-pay

## 2019-07-02 ENCOUNTER — Encounter: Payer: Self-pay | Admitting: Psychiatry

## 2019-07-02 ENCOUNTER — Ambulatory Visit (INDEPENDENT_AMBULATORY_CARE_PROVIDER_SITE_OTHER): Payer: PRIVATE HEALTH INSURANCE | Admitting: Psychiatry

## 2019-07-02 DIAGNOSIS — F5105 Insomnia due to other mental disorder: Secondary | ICD-10-CM | POA: Diagnosis not present

## 2019-07-02 DIAGNOSIS — F3177 Bipolar disorder, in partial remission, most recent episode mixed: Secondary | ICD-10-CM | POA: Diagnosis not present

## 2019-07-02 NOTE — Progress Notes (Signed)
Provider Location : [facility] Patient Location : [facility]  Virtual Visit via Video Note  I connected with Darrell Kennedy on 07/02/19 at  1:30 PM EDT by a video enabled telemedicine application and verified that I am speaking with the correct person using two identifiers.   I discussed the limitations of evaluation and management by telemedicine and the availability of in person appointments. The patient expressed understanding and agreed to proceed.    I discussed the assessment and treatment plan with the patient. The patient was provided an opportunity to ask questions and all were answered. The patient agreed with the plan and demonstrated an understanding of the instructions.   The patient was advised to call back or seek an in-person evaluation if the symptoms worsen or if the condition fails to improve as anticipated.   BH MD OP Progress Note  07/02/2019 1:58 PM Darrell Kennedy  MRN:  [id number]  Chief Complaint:  Chief Complaint    Follow-up     HPI: Darrell Kennedy is a 39 year old Caucasian male, lives in [place], has a history of bipolar disorder, hyperlipidemia, HIV positive, recent TIA was evaluated by telemedicine today.  Patient today reports he is currently making progress on the current medication regimen.  The increased dosage of Lamictal definitely helps with his mood lability.  He does not have the days when he feels depressed and down anymore.  He feels more stable.  He reports sleep continues to be good on the Seroquel.  Patient does report he has gained a few pounds since being on the Seroquel.  He is worried about that however he is trying to exercise and eat healthy at this time.  He does not want to make any changes with the Seroquel yet.  Patient denies any suicidality, homicidality or perceptual disturbances.  Patient denies any other concerns today. Visit Diagnosis:    ICD-10-CM   1. Bipolar disorder, in partial remission, most recent episode mixed (HCC)   F31.77   2. Insomnia due to mental condition  F51.05     Past Psychiatric History: I have reviewed past psychiatric history from my progress note on 02/06/2019.  Past trials of BuSpar, Seroquel  Past Medical History:  Past Medical History:  Diagnosis Date  . Heart murmur   . HIV (human immunodeficiency virus infection) (HCC)   . HLD (hyperlipidemia)   . Stroke [facility])     Past Surgical History:  Procedure Laterality Date  . TEE WITHOUT CARDIOVERSION N/A 03/05/2019   Procedure: TRANSESOPHAGEAL ECHOCARDIOGRAM (TEE);  Surgeon: Lamar Blinks, MD;  Location: [facility] ORS;  Service: Cardiovascular;  Laterality: N/A;    Family Psychiatric History: I have reviewed family psychiatric history from my progress note on 02/06/2019  Family History:  Family History  Problem Relation Age of Onset  . Hypertension Father   . Diabetes Father   . Kidney disease Father   . Depression Father   . Hyperlipidemia Father   . Alcohol abuse Brother   . Heart disease Paternal Grandmother   . Drug abuse Cousin     Social History: I have reviewed social history from my progress note on 02/06/2019 Social History   Socioeconomic History  . Marital status: Single    Spouse name: Not on file  . Number of children: 0  . Years of education: Not on file  . Highest education level: Master's degree (e.g., MA, MS, MEng, MEd, MSW, MBA)  Occupational History  . Occupation: Consulting civil engineer  Tobacco Use  . Smoking status:  Never Smoker  . Smokeless tobacco: Never Used  Substance and Sexual Activity  . Alcohol use: Yes    Comment: social maybe once a week  . Drug use: Never  . Sexual activity: Yes    Birth control/protection: Condom  Other Topics Concern  . Not on file  Social History Narrative  . Not on file   Social Determinants of Health   Financial Resource Strain: Low Risk   . Difficulty of Paying Living Expenses: Not hard at all  Food Insecurity: No Food Insecurity  . Worried About Charity fundraiser  in the Last Year: Never true  . Ran Out of Food in the Last Year: Never true  Transportation Needs: No Transportation Needs  . Lack of Transportation (Medical): No  . Lack of Transportation (Non-Medical): No  Physical Activity: Sufficiently Active  . Days of Exercise per Week: 6 days  . Minutes of Exercise per Session: 60 min  Stress: Stress Concern Present  . Feeling of Stress : Very much  Social Connections: Moderately Isolated  . Frequency of Communication with Friends and Family: More than three times a week  . Frequency of Social Gatherings with Friends and Family: More than three times a week  . Attends Religious Services: Never  . Active Member of Clubs or Organizations: No  . Attends Archivist Meetings: Never  . Marital Status: Never married    Allergies: No Known Allergies  Metabolic Disorder Labs: Lab Results  Component Value Date   HGBA1C 5.6 02/12/2019   MPG 114.02 02/12/2019   No results found for: PROLACTIN Lab Results  Component Value Date   CHOL 157 02/12/2019   TRIG 135 02/12/2019   HDL 52 02/12/2019   CHOLHDL 3.0 02/12/2019   VLDL 27 02/12/2019   LDLCALC 78 02/12/2019   LDLCALC 159 (H) 11/25/2018   Lab Results  Component Value Date   TSH 1.78 02/12/2019    Therapeutic Level Labs: No results found for: LITHIUM No results found for: VALPROATE No components found for:  CBMZ  Current Medications: Current Outpatient Medications  Medication Sig Dispense Refill  . aspirin (ASPIRIN CHILDRENS) 81 MG chewable tablet Chew 1 tablet (81 mg total) by mouth daily. 120 tablet 0  . atorvastatin (LIPITOR) 20 MG tablet Take 1 tablet (20 mg total) by mouth daily. 90 tablet 3  . atorvastatin (LIPITOR) 20 MG tablet     . bictegravir-emtricitabine-tenofovir AF (BIKTARVY) 50-200-25 MG TABS tablet Take 1 tablet by mouth daily. (Patient taking differently: Take 1 tablet by mouth daily at 12 noon. ) 30 tablet 4  . bictegravir-emtricitabine-tenofovir AF  (BIKTARVY) 50-200-25 MG TABS tablet TK 1 T PO D    . lamoTRIgine (LAMICTAL) 25 MG tablet Take 2-3 tablets (50-75 mg total) by mouth as directed. Take 2 tablets ( 50 mg) daily for 2 weeks and increase to 3 tablets after that. 90 tablet 1  . Levomefolate Glucosamine (METHYLFOLATE PO) Take 1 mg by mouth daily.    . Multiple Vitamin (MULTIVITAMIN WITH MINERALS) TABS tablet Take 1 tablet by mouth daily.    Marland Kitchen OVER THE COUNTER MEDICATION Levium anxiety supplement    . QUEtiapine (SEROQUEL) 100 MG tablet Take 1 tablet (100 mg total) by mouth at bedtime. 90 tablet 0  . rosuvastatin (CRESTOR) 10 MG tablet     . tiZANidine (ZANAFLEX) 4 MG tablet Take 4 mg by mouth daily as needed (pinched nerve).     Marland Kitchen tiZANidine (ZANAFLEX) 4 MG tablet Take by mouth.    Marland Kitchen  traZODone (DESYREL) 50 MG tablet Take 0.5 tablets (25 mg total) by mouth at bedtime as needed for sleep. 30 tablet 1   No current facility-administered medications for this visit.     Musculoskeletal: Strength & Muscle Tone: UTA Gait & Station: normal Patient leans: N/A  Psychiatric Specialty Exam: Review of Systems  Constitutional: Positive for unexpected weight change.  Psychiatric/Behavioral: Negative for agitation, behavioral problems, confusion, decreased concentration, dysphoric mood, hallucinations, self-injury, sleep disturbance and suicidal ideas. The patient is not nervous/anxious and is not hyperactive.   All other systems reviewed and are negative.   There were no vitals taken for this visit.There is no height or weight on file to calculate BMI.  General Appearance: Casual  Eye Contact:  Fair  Speech:  Normal Rate  Volume:  Normal  Mood:  Euthymic  Affect:  Appropriate  Thought Process:  Goal Directed and Descriptions of Associations: Intact  Orientation:  Full (Time, Place, and Person)  Thought Content: Logical   Suicidal Thoughts:  No  Homicidal Thoughts:  No  Memory:  Immediate;   Fair Recent;   Fair Remote;   Fair   Judgement:  Fair  Insight:  Fair  Psychomotor Activity:  Normal  Concentration:  Concentration: Fair and Attention Span: Fair  Recall:  [employer] of Knowledge: Fair  Language: Fair  Akathisia:  No  Handed:  Right  AIMS (if indicated): UTA  Assets:  Communication Skills Desire for Improvement Housing Social Support  ADL's:  Intact  Cognition: WNL  Sleep:  Fair   Screenings: AUDIT     Office Visit from 10/07/2018 in [facility]  Alcohol Use Disorder Identification Test Final Score (AUDIT)  3    PHQ2-9     Office Visit from 03/27/2019 in [facility] Office Visit from 02/20/2019 in [facility] Office Visit from 11/25/2018 in [facility] Office Visit from 10/07/2018 in [facility]  PHQ-2 Total Score  0  2  2  2   PHQ-9 Total Score  2  10  11  2        Assessment and Plan: is a 39 year old Caucasian male, single, Darrell Kennedy, lives in [place] with his parents, has a history of bipolar disorder, hyperlipidemia, HIV positive, anticardiolipin antibodies, was evaluated by telemedicine today.  He is biologically predisposed given his family history, history of,, history of substance abuse problems in the past.  Patient however is currently making progress on the current medication regimen.  Plan as noted below.  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 nightly Lamotrigine 75 mg p.o. daily. Hydroxyzine 10 mg p.o. daily as needed for anxiety attacks  Insomnia-improved Seroquel 100 mg p.o. nightly Trazodone 25 mg at bedtime as needed.  Continue psychotherapy sessions with [facility] wellness program.  Discussed with patient to watch his weight, exercise regularly count calories.  If he continues to have weight gain with the Seroquel the Seroquel could be discontinued and replaced with another medication.  Follow-up in clinic in 1 month or sooner if  needed.  I have spent atleast 20 minutes non face to face with patient today. More than 50 % of the time was spent for preparing to see the patient ( e.g., review of test, records ), obtaining and to review and separately obtained history , ordering medications and test ,psychoeducation and supportive psychotherapy and care coordination,as well as documenting clinical information in electronic health record. This note was generated  in part or whole with voice recognition software. Voice recognition is usually quite accurate but there are transcription errors that can and very often do occur. I apologize for any typographical errors that were not detected and corrected.       Jomarie Longs, MD 07/02/2019, 1:58 PM

## 2019-07-03 ENCOUNTER — Encounter: Payer: Self-pay | Admitting: Family Medicine

## 2019-07-08 ENCOUNTER — Telehealth: Payer: Self-pay

## 2019-07-08 NOTE — Telephone Encounter (Signed)
Pt. Reports he has an appointment  Friday for COVID 19 vaccine and on his questionnaire told him to notify [facility] if he traveled recently. He has traveled, has no symptoms of COVID 19.

## 2019-07-11 ENCOUNTER — Ambulatory Visit: Payer: PRIVATE HEALTH INSURANCE | Attending: Internal Medicine

## 2019-07-11 DIAGNOSIS — Z23 Encounter for immunization: Secondary | ICD-10-CM

## 2019-07-11 NOTE — Progress Notes (Signed)
   Covid-19 Vaccination Clinic  Name:  Darrell Kennedy    MRN: [id number] DOB: January 13, 1981  07/11/2019  Mr. Gahm was observed post Covid-19 immunization for 15 minutes without incident. He was provided with Vaccine Information Sheet and instruction to access the V-Safe system.   Mr. Kimrey was instructed to call 911 with any severe reactions post vaccine: Marland Kitchen Difficulty breathing  . Swelling of face and throat  . A fast heartbeat  . A bad rash all over body  . Dizziness and weakness   Immunizations Administered    Name Date Dose VIS Date Route   Pfizer COVID-19 Vaccine 07/11/2019  1:52 PM 0.3 mL 03/28/2019 Intramuscular   Manufacturer: [employer], [employer]   Lot: [id number]   NDC: [address]-1

## 2019-07-15 ENCOUNTER — Other Ambulatory Visit: Payer: Self-pay | Admitting: Infectious Diseases

## 2019-07-15 DIAGNOSIS — B20 Human immunodeficiency virus [HIV] disease: Secondary | ICD-10-CM

## 2019-07-15 MED ORDER — BIKTARVY 50-200-25 MG PO TABS: 1.0000 | ORAL_TABLET | Freq: Every day | ORAL | 4 refills | Status: DC

## 2019-07-28 ENCOUNTER — Ambulatory Visit: Payer: PRIVATE HEALTH INSURANCE | Admitting: Family Medicine

## 2019-08-05 ENCOUNTER — Ambulatory Visit: Payer: PRIVATE HEALTH INSURANCE | Attending: Internal Medicine

## 2019-08-05 DIAGNOSIS — Z23 Encounter for immunization: Secondary | ICD-10-CM

## 2019-08-05 NOTE — Progress Notes (Signed)
   Covid-19 Vaccination Clinic  Name:  Darrell Kennedy    MRN: [id number] DOB: [birth year]  08/05/2019  Darrell Kennedy was observed post Covid-19 immunization for 15 minutes without incident. He was provided with Vaccine Information Sheet and instruction to access the V-Safe system.   Darrell Kennedy was instructed to call 911 with any severe reactions post vaccine: Marland Kitchen Difficulty breathing  . Swelling of face and throat  . A fast heartbeat  . A bad rash all over body  . Dizziness and weakness   Immunizations Administered    Name Date Dose VIS Date Route   Pfizer COVID-19 Vaccine 08/05/2019 11:18 AM 0.3 mL 06/11/2018 Intramuscular   Manufacturer: [employer], [employer]   Lot: [id number]   NDC: [address]-8

## 2019-08-07 ENCOUNTER — Encounter: Payer: Self-pay | Admitting: Psychiatry

## 2019-08-07 ENCOUNTER — Other Ambulatory Visit: Payer: Self-pay

## 2019-08-07 ENCOUNTER — Telehealth (INDEPENDENT_AMBULATORY_CARE_PROVIDER_SITE_OTHER): Payer: PRIVATE HEALTH INSURANCE | Admitting: Psychiatry

## 2019-08-07 DIAGNOSIS — F3181 Bipolar II disorder: Secondary | ICD-10-CM | POA: Diagnosis not present

## 2019-08-07 DIAGNOSIS — F5105 Insomnia due to other mental disorder: Secondary | ICD-10-CM

## 2019-08-07 MED ORDER — LAMOTRIGINE 100 MG PO TABS: 100.0000 mg | ORAL_TABLET | Freq: Every day | ORAL | 0 refills | Status: DC

## 2019-08-07 NOTE — Patient Instructions (Signed)
Follow-up in clinic on June 3 at 11:30 AM

## 2019-08-07 NOTE — Progress Notes (Signed)
Provider Location : [facility] Patient Location : Home  Virtual Visit via Video Note  I connected with Darrell Kennedy on 08/07/19 at  3:00 PM EDT by a video enabled telemedicine application and verified that I am speaking with the correct person using two identifiers.   I discussed the limitations of evaluation and management by telemedicine and the availability of in person appointments. The patient expressed understanding and agreed to proceed.     I discussed the assessment and treatment plan with the patient. The patient was provided an opportunity to ask questions and all were answered. The patient agreed with the plan and demonstrated an understanding of the instructions.   The patient was advised to call back or seek an in-person evaluation if the symptoms worsen or if the condition fails to improve as anticipated.   BH MD OP Progress Note  08/07/2019 6:05 PM Darrell Kennedy  MRN:  [id number]  Chief Complaint:  Chief Complaint    Follow-up     HPI: Darrell Kennedy is a 39 year old Caucasian male, lives in [place], has a history of bipolar disorder, hyperlipidemia, HIV positive, recent TIA was evaluated by telemedicine today.  Patient today reports he recently had a manic episode.  He reports he felt energetic and could not sleep and felt like he was doing a lot more things than usual.  This may have lasted for 2 weeks or more.  He however reports he however was able to manage his mania better at this time.  He did not do anything reckless and his medications helped him to get through it.  He reports after the manic episode he felt more tired and depressed.  He reports however that was not an emotional depression however more like he felt his energy was lower than what it was a week ago.  He however today reports he is feeling much better.  He reports sleep is currently good on the Seroquel.  Patient denies any suicidality or homicidality or perceptual disturbances.  Patient reports  he is compliant on his medications however because of the recent episode he wants to try to readjust his mood stabilizer.  He does report recent psychosocial stressors of completing school as well as recent break-up with his friend which could have triggered his most recent episode. Visit Diagnosis:    ICD-10-CM   1. Bipolar 2 disorder (HCC)  F31.81 lamoTRIgine (LAMICTAL) 100 MG tablet   hypomanic, most recent episode mild  2. Insomnia due to mental condition  F51.05     Past Psychiatric History: I have reviewed past psychiatric history from my progress note on 02/06/2019.  Past trials of BuSpar, Seroquel  Past Medical History:  Past Medical History:  Diagnosis Date  . Heart murmur   . HIV (human immunodeficiency virus infection) (HCC)   . HLD (hyperlipidemia)   . Stroke [facility])     Past Surgical History:  Procedure Laterality Date  . TEE WITHOUT CARDIOVERSION N/A 03/05/2019   Procedure: TRANSESOPHAGEAL ECHOCARDIOGRAM (TEE);  Surgeon: Lamar Blinks, MD;  Location: [facility] ORS;  Service: Cardiovascular;  Laterality: N/A;    Family Psychiatric History: I have reviewed family psychiatric history from my progress note on 02/06/2019  Family History:  Family History  Problem Relation Age of Onset  . Hypertension Father   . Diabetes Father   . Kidney disease Father   . Depression Father   . Hyperlipidemia Father   . Alcohol abuse Brother   . Heart disease Paternal Grandmother   . Drug  abuse Cousin     Social History: I have reviewed social history from my progress note on 02/06/2019 Social History   Socioeconomic History  . Marital status: Single    Spouse name: Not on file  . Number of children: 0  . Years of education: Not on file  . Highest education level: Master's degree (e.g., MA, MS, MEng, MEd, MSW, MBA)  Occupational History  . Occupation: Ship broker  Tobacco Use  . Smoking status: Never Smoker  . Smokeless tobacco: Never Used  Substance and Sexual Activity  .  Alcohol use: Yes    Comment: social maybe once a week  . Drug use: Never  . Sexual activity: Yes    Birth control/protection: Condom  Other Topics Concern  . Not on file  Social History Narrative  . Not on file   Social Determinants of Health   Financial Resource Strain: Low Risk   . Difficulty of Paying Living Expenses: Not hard at all  Food Insecurity: No Food Insecurity  . Worried About Charity fundraiser in the Last Year: Never true  . Ran Out of Food in the Last Year: Never true  Transportation Needs: No Transportation Needs  . Lack of Transportation (Medical): No  . Lack of Transportation (Non-Medical): No  Physical Activity: Sufficiently Active  . Days of Exercise per Week: 6 days  . Minutes of Exercise per Session: 60 min  Stress: Stress Concern Present  . Feeling of Stress : Very much  Social Connections: Moderately Isolated  . Frequency of Communication with Friends and Family: More than three times a week  . Frequency of Social Gatherings with Friends and Family: More than three times a week  . Attends Religious Services: Never  . Active Member of Clubs or Organizations: No  . Attends Archivist Meetings: Never  . Marital Status: Never married    Allergies: No Known Allergies  Metabolic Disorder Labs: Lab Results  Component Value Date   HGBA1C 5.6 02/12/2019   MPG 114.02 02/12/2019   No results found for: PROLACTIN Lab Results  Component Value Date   CHOL 157 02/12/2019   TRIG 135 02/12/2019   HDL 52 02/12/2019   CHOLHDL 3.0 02/12/2019   VLDL 27 02/12/2019   LDLCALC 78 02/12/2019   LDLCALC 159 (H) 11/25/2018   Lab Results  Component Value Date   TSH 1.78 02/12/2019    Therapeutic Level Labs: No results found for: LITHIUM No results found for: VALPROATE No components found for:  CBMZ  Current Medications: Current Outpatient Medications  Medication Sig Dispense Refill  . aspirin (ASPIRIN CHILDRENS) 81 MG chewable tablet Chew 1  tablet (81 mg total) by mouth daily. 120 tablet 0  . atorvastatin (LIPITOR) 20 MG tablet Take 1 tablet (20 mg total) by mouth daily. 90 tablet 3  . bictegravir-emtricitabine-tenofovir AF (BIKTARVY) 50-200-25 MG TABS tablet Take 1 tablet by mouth daily. 30 tablet 4  . lamoTRIgine (LAMICTAL) 100 MG tablet Take 1 tablet (100 mg total) by mouth daily. 90 tablet 0  . Levomefolate Glucosamine (METHYLFOLATE PO) Take 1 mg by mouth daily.    . Multiple Vitamin (MULTIVITAMIN WITH MINERALS) TABS tablet Take 1 tablet by mouth daily.    Marland Kitchen OVER THE COUNTER MEDICATION Levium anxiety supplement    . QUEtiapine (SEROQUEL) 100 MG tablet Take 1 tablet (100 mg total) by mouth at bedtime. 90 tablet 0  . rosuvastatin (CRESTOR) 10 MG tablet     . tiZANidine (ZANAFLEX) 4 MG tablet Take  4 mg by mouth daily as needed (pinched nerve).     . traZODone (DESYREL) 50 MG tablet Take 0.5 tablets (25 mg total) by mouth at bedtime as needed for sleep. 30 tablet 1   No current facility-administered medications for this visit.     Musculoskeletal: Strength & Muscle Tone: UTA Gait & Station: normal Patient leans: N/A  Psychiatric Specialty Exam: Review of Systems  Psychiatric/Behavioral: The patient is nervous/anxious.   All other systems reviewed and are negative.   There were no vitals taken for this visit.There is no height or weight on file to calculate BMI.  General Appearance: Casual  Eye Contact:  Fair  Speech:  Normal Rate  Volume:  Normal  Mood:  Anxious  Affect:  Appropriate  Thought Process:  Goal Directed and Descriptions of Associations: Intact  Orientation:  Full (Time, Place, and Person)  Thought Content: Logical   Suicidal Thoughts:  No  Homicidal Thoughts:  No  Memory:  Immediate;   Fair Recent;   Fair Remote;   Fair  Judgement:  Fair  Insight:  Fair  Psychomotor Activity:  Normal  Concentration:  Concentration: Fair and Attention Span: Fair  Recall:  [employer] of Knowledge: Fair   Language: Fair  Akathisia:  No  Handed:  Right  AIMS (if indicated): UTA  Assets:  Communication Skills Desire for Improvement Social Support  ADL's:  Intact  Cognition: WNL  Sleep:  Fair   Screenings: AUDIT     [facility] from 10/07/2018 in [facility]  Alcohol Use Disorder Identification Test Final Score (AUDIT)  3    PHQ2-9     Office Visit from 03/27/2019 in [facility] Office Visit from 02/20/2019 in [facility] Office Visit from 11/25/2018 in [facility] Office Visit from 10/07/2018 in [facility]  PHQ-2 Total Score  0  2  2  2   PHQ-9 Total Score  2  10  11  2        Assessment and Plan: is a 39 year old Caucasian male, single, Darrell Kennedy, lives in [place] with his parents, has a history of bipolar disorder, hyperlipidemia, HIV positive, anticardiolipin antibodies was evaluated by telemedicine today.  Patient is biologically predisposed given his family history, history of substance abuse problems and health problems.  Patient does report recent episode of possible hypomania and will continue to benefit from medication readjustment.  Plan as noted below.  Plan Bipolar disorder type II-currently improving Seroquel 100 mg p.o. nightly Increase Lamictal to 100 mg p.o. daily Hydroxyzine 10 mg p.o. daily as needed for anxiety attacks  Insomnia-stable Seroquel 100 mg p.o. nightly Trazodone 25 mg at bedtime as needed  Continue psychotherapy sessions with Consulting civil engineer program.  Follow-up in clinic in 6 weeks or sooner if needed.  I have spent atleast 20 minutes non face to face with patient today. More than 50 % of the time was spent for preparing to see the patient ( e.g., review of test, records ), ordering medications and test ,psychoeducation and supportive psychotherapy and care coordination,as well as documenting clinical information in electronic health  record. This note was generated in part or whole with voice recognition software. Voice recognition is usually quite accurate but there are transcription errors that can and very often do occur. I apologize for any typographical errors that were not detected and corrected.       [place], MD 08/07/2019, 6:05 PM

## 2019-08-13 ENCOUNTER — Other Ambulatory Visit: Payer: Self-pay | Admitting: Psychiatry

## 2019-08-13 DIAGNOSIS — F3181 Bipolar II disorder: Secondary | ICD-10-CM

## 2019-08-30 ENCOUNTER — Other Ambulatory Visit: Payer: Self-pay | Admitting: Psychiatry

## 2019-08-30 DIAGNOSIS — F3181 Bipolar II disorder: Secondary | ICD-10-CM

## 2019-09-18 ENCOUNTER — Telehealth (INDEPENDENT_AMBULATORY_CARE_PROVIDER_SITE_OTHER): Payer: PRIVATE HEALTH INSURANCE | Admitting: Psychiatry

## 2019-09-18 ENCOUNTER — Other Ambulatory Visit: Payer: Self-pay

## 2019-09-18 ENCOUNTER — Encounter: Payer: Self-pay | Admitting: Psychiatry

## 2019-09-18 DIAGNOSIS — F3172 Bipolar disorder, in full remission, most recent episode hypomanic: Secondary | ICD-10-CM

## 2019-09-18 DIAGNOSIS — F5105 Insomnia due to other mental disorder: Secondary | ICD-10-CM

## 2019-09-18 MED ORDER — TRAZODONE HCL 50 MG PO TABS: 50.0000 mg | ORAL_TABLET | Freq: Every evening | ORAL | 1 refills | Status: DC | PRN

## 2019-09-18 NOTE — Progress Notes (Signed)
Provider Location : [facility] Patient Location : Home  Virtual Visit via Video Note  I connected with Darrell Kennedy on 09/18/19 at 11:30 AM EDT by a video enabled telemedicine application and verified that I am speaking with the correct person using two identifiers.   I discussed the limitations of evaluation and management by telemedicine and the availability of in person appointments. The patient expressed understanding and agreed to proceed.    I discussed the assessment and treatment plan with the patient. The patient was provided an opportunity to ask questions and all were answered. The patient agreed with the plan and demonstrated an understanding of the instructions.   The patient was advised to call back or seek an in-person evaluation if the symptoms worsen or if the condition fails to improve as anticipated.  [facility] MD OP Progress Note  09/18/2019 11:44 AM Darrell Kennedy  MRN:  [id number]  Chief Complaint:  Chief Complaint    Follow-up     HPI: Darrell Kennedy is a 39 year old Caucasian male, lives in [place], has a history of bipolar disorder, hyperlipidemia, HIV positive, recent TIA was evaluated by telemedicine today.  Patient today reports he is currently doing well.  He denies any manic symptoms.  He denies any depressive symptoms.  Patient reports he is sleeping well on the Seroquel.  He takes the trazodone as needed only.  He is compliant on the Lamictal.  He reports work is going well.  Patient denies suicidality, homicidality or perceptual disturbances.  Patient reports he is worried about weight gain side effects of the Seroquel.  He wonders whether he can come off of it.  Patient denies any other concerns today.  Visit Diagnosis:    ICD-10-CM   1. Bipolar disorder, in full remission, most recent episode hypomanic ([place])  F31.72   2. Insomnia due to mental condition  F51.05 traZODone (DESYREL) 50 MG tablet    Past Psychiatric History: I have reviewed  past psychiatric history from my progress note on 02/06/2019.  Past trials of BuSpar, Seroquel.  Past Medical History:  Past Medical History:  Diagnosis Date  . Heart murmur   . HIV (human immunodeficiency virus infection) ([place])   . HLD (hyperlipidemia)   . Stroke [facility])     Past Surgical History:  Procedure Laterality Date  . TEE WITHOUT CARDIOVERSION N/A 03/05/2019   Procedure: TRANSESOPHAGEAL ECHOCARDIOGRAM (TEE);  Surgeon: Corey Skains, MD;  Location: [facility] ORS;  Service: Cardiovascular;  Laterality: N/A;    Family Psychiatric History: Reviewed family psychiatric history per my progress note on 02/06/2019  Family History:  Family History  Problem Relation Age of Onset  . Hypertension Father   . Diabetes Father   . Kidney disease Father   . Depression Father   . Hyperlipidemia Father   . Alcohol abuse Brother   . Heart disease Paternal Grandmother   . Drug abuse Cousin     Social History: Reviewed social history from my progress note on 02/06/2019 Social History   Socioeconomic History  . Marital status: Single    Spouse name: Not on file  . Number of children: 0  . Years of education: Not on file  . Highest education level: Master's degree (e.g., MA, MS, MEng, MEd, MSW, MBA)  Occupational History  . Occupation: Ship broker  Tobacco Use  . Smoking status: Never Smoker  . Smokeless tobacco: Never Used  Substance and Sexual Activity  . Alcohol use: Yes    Comment: social maybe once a  week  . Drug use: Never  . Sexual activity: Yes    Birth control/protection: Condom  Other Topics Concern  . Not on file  Social History Narrative  . Not on file   Social Determinants of Health   Financial Resource Strain: Low Risk   . Difficulty of Paying Living Expenses: Not hard at all  Food Insecurity: No Food Insecurity  . Worried About Programme researcher, broadcasting/film/video in the Last Year: Never true  . Ran Out of Food in the Last Year: Never true  Transportation Needs: No  Transportation Needs  . Lack of Transportation (Medical): No  . Lack of Transportation (Non-Medical): No  Physical Activity: Sufficiently Active  . Days of Exercise per Week: 6 days  . Minutes of Exercise per Session: 60 min  Stress: Stress Concern Present  . Feeling of Stress : Very much  Social Connections: Moderately Isolated  . Frequency of Communication with Friends and Family: More than three times a week  . Frequency of Social Gatherings with Friends and Family: More than three times a week  . Attends Religious Services: Never  . Active Member of Clubs or Organizations: No  . Attends Banker Meetings: Never  . Marital Status: Never married    Allergies: No Known Allergies  Metabolic Disorder Labs: Lab Results  Component Value Date   HGBA1C 5.6 02/12/2019   MPG 114.02 02/12/2019   No results found for: PROLACTIN Lab Results  Component Value Date   CHOL 157 02/12/2019   TRIG 135 02/12/2019   HDL 52 02/12/2019   CHOLHDL 3.0 02/12/2019   VLDL 27 02/12/2019   LDLCALC 78 02/12/2019   LDLCALC 159 (H) 11/25/2018   Lab Results  Component Value Date   TSH 1.78 02/12/2019    Therapeutic Level Labs: No results found for: LITHIUM No results found for: VALPROATE No components found for:  CBMZ  Current Medications: Current Outpatient Medications  Medication Sig Dispense Refill  . aspirin (ASPIRIN CHILDRENS) 81 MG chewable tablet Chew 1 tablet (81 mg total) by mouth daily. 120 tablet 0  . atorvastatin (LIPITOR) 20 MG tablet Take 1 tablet (20 mg total) by mouth daily. 90 tablet 3  . bictegravir-emtricitabine-tenofovir AF (BIKTARVY) 50-200-25 MG TABS tablet Take 1 tablet by mouth daily. 30 tablet 4  . lamoTRIgine (LAMICTAL) 100 MG tablet Take 1 tablet (100 mg total) by mouth daily. 90 tablet 0  . Levomefolate Glucosamine (METHYLFOLATE PO) Take 1 mg by mouth daily.    . Multiple Vitamin (MULTIVITAMIN WITH MINERALS) TABS tablet Take 1 tablet by mouth daily.    Marland Kitchen  OVER THE COUNTER MEDICATION Levium anxiety supplement    . QUEtiapine (SEROQUEL) 100 MG tablet TAKE 1 TABLET(100 MG) BY MOUTH AT BEDTIME 90 tablet 0  . rosuvastatin (CRESTOR) 10 MG tablet     . tiZANidine (ZANAFLEX) 4 MG tablet Take 4 mg by mouth daily as needed (pinched nerve).     . traZODone (DESYREL) 50 MG tablet Take 1-2 tablets (50-100 mg total) by mouth at bedtime as needed for sleep. 60 tablet 1   No current facility-administered medications for this visit.     Musculoskeletal: Strength & Muscle Tone: UTA Gait & Station: normal Patient leans: N/A  Psychiatric Specialty Exam: Review of Systems  Psychiatric/Behavioral: Negative for agitation, behavioral problems, confusion, decreased concentration, dysphoric mood, hallucinations, self-injury, sleep disturbance and suicidal ideas. The patient is not nervous/anxious and is not hyperactive.   All other systems reviewed and are negative.  There were no vitals taken for this visit.There is no height or weight on file to calculate BMI.  General Appearance: Casual  Eye Contact:  Fair  Speech:  Clear and Coherent  Volume:  Normal  Mood:  Euthymic  Affect:  Congruent  Thought Process:  Goal Directed and Descriptions of Associations: Intact  Orientation:  Full (Time, Place, and Person)  Thought Content: Logical   Suicidal Thoughts:  No  Homicidal Thoughts:  No  Memory:  Immediate;   Fair Recent;   Fair Remote;   Fair  Judgement:  Fair  Insight:  Fair  Psychomotor Activity:  Normal  Concentration:  Concentration: Fair and Attention Span: Fair  Recall:  [employer] of Knowledge: Fair  Language: Fair  Akathisia:  No  Handed:  Right  AIMS (if indicated): UTA  Assets:  Communication Skills Desire for Improvement Housing Social Support  ADL's:  Intact  Cognition: WNL  Sleep:  Fair   Screenings: AUDIT     Office Visit from 10/07/2018 in [facility]  Alcohol Use Disorder Identification Test Final Score  (AUDIT)  3    PHQ2-9     Office Visit from 03/27/2019 in [facility] Office Visit from 02/20/2019 in [facility] Office Visit from 11/25/2018 in [facility] Office Visit from 10/07/2018 in [facility]  PHQ-2 Total Score  0  2  2  2   PHQ-9 Total Score  2  10  11  2        Assessment and Plan: ROSHARD REZABEK is a-year-old Caucasian male, student, employed, lives in [place] with his parents, has a history of bipolar disorder, hyperlipidemia, HIV positive, anticardiolipin antibodies was evaluated by telemedicine today.  He is biologically predisposed given his family history, history of substance abuse problems and health problems.  Patient however currently is stable on medications.  Plan as noted below.  Plan Bipolar disorder in remission Lamictal 100 mg p.o. daily. Patient since is interested in being tapered off of the Seroquel, discussed with patient to reduce Seroquel to 50 mg p.o. nightly for the next 2 weeks. If he notices any mood swings, advised to call writer back and at that point his Lamictal could be readjusted as needed. Hydroxyzine 10 mg p.o. daily as needed for anxiety attacks  Insomnia-stable Discussed reducing Seroquel to 50 mg p.o. nightly for the next 2 weeks and then take it only as needed. He could increase his trazodone to 50 to 100 mg p.o. nightly since he is being tapered off of the Seroquel.  Continue psychotherapy sessions with [facility].  Follow-up in clinic in 4 to 6 weeks or sooner if needed.  I have spent atleast 20 minutes non face to face with patient today. More than 50 % of the time was spent for preparing to see the patient ( e.g., review of test, records ),ordering medications and test ,psychoeducation and supportive psychotherapy and care coordination,as well as documenting clinical information in electronic health records. This note was generated in part or  whole with voice recognition software. Voice recognition is usually quite accurate but there are transcription errors that can and very often do occur. I apologize for any typographical errors that were not detected and corrected.          [name], MD 09/18/2019, 11:44 AM

## 2019-09-23 ENCOUNTER — Other Ambulatory Visit: Payer: Self-pay

## 2019-09-23 ENCOUNTER — Encounter: Payer: Self-pay | Admitting: Infectious Diseases

## 2019-09-23 ENCOUNTER — Ambulatory Visit (HOSPITAL_BASED_OUTPATIENT_CLINIC_OR_DEPARTMENT_OTHER): Payer: PRIVATE HEALTH INSURANCE | Admitting: Infectious Diseases

## 2019-09-23 ENCOUNTER — Other Ambulatory Visit
Admission: RE | Admit: 2019-09-23 | Discharge: 2019-09-23 | Disposition: A | Discharge status: Home / Self Care | Payer: PRIVATE HEALTH INSURANCE | Source: Ambulatory Visit | Attending: Infectious Diseases | Admitting: Infectious Diseases

## 2019-09-23 VITALS — BP 105/76 | HR 76 | Temp 98.2°F | Resp 16 | Wt 180.0 lb

## 2019-09-23 DIAGNOSIS — Z23 Encounter for immunization: Secondary | ICD-10-CM

## 2019-09-23 DIAGNOSIS — B20 Human immunodeficiency virus [HIV] disease: Secondary | ICD-10-CM | POA: Diagnosis not present

## 2019-09-23 LAB — COMPREHENSIVE METABOLIC PANEL
ALT: 18 U/L (ref 0–44)
AST: 20 U/L (ref 15–41)
Albumin: 4.3 g/dL (ref 3.5–5.0)
Alkaline Phosphatase: 57 U/L (ref 38–126)
Anion gap: 6 (ref 5–15)
BUN: 15 mg/dL (ref 6–20)
CO2: 27 mmol/L (ref 22–32)
Calcium: 9.1 mg/dL (ref 8.9–10.3)
Chloride: 106 mmol/L (ref 98–111)
Creatinine, Ser: 1.23 mg/dL (ref 0.61–1.24)
GFR calc Af Amer: >60 mL/min (ref >60)
GFR calc non Af Amer: >60 mL/min (ref >60)
Glucose, Bld: 111 mg/dL — ABNORMAL HIGH (ref 70–99)
Potassium: 4.2 mmol/L (ref 3.5–5.1)
Sodium: 139 mmol/L (ref 135–145)
Total Bilirubin: 0.7 mg/dL (ref 0.3–1.2)
Total Protein: 7.6 g/dL (ref 6.5–8.1)

## 2019-09-23 LAB — LIPID PANEL
Cholesterol: 161 mg/dL (ref 0–200)
HDL: 58 mg/dL (ref >40)
LDL Cholesterol: 89 mg/dL (ref 0–99)
Total CHOL/HDL Ratio: 2.8 RATIO
Triglycerides: 70 mg/dL (ref <150)
VLDL: 14 mg/dL (ref 0–40)

## 2019-09-23 LAB — CK: Total CK: 149 U/L (ref 49–397)

## 2019-09-23 NOTE — Addendum Note (Signed)
Addended by: Clayborne Artist A on: 09/23/2019 11:05 AM   Modules accepted: Orders

## 2019-09-23 NOTE — Addendum Note (Signed)
Addended by: Deloria Lair on: 09/23/2019 10:19 AM   Modules accepted: Orders

## 2019-09-23 NOTE — Progress Notes (Signed)
NAME: Darrell Kennedy  DOB: Oct 04, 1980  MRN: [id number]  Date/Time: 09/23/2019 9:16 AM  Subjective:  Follow up  ? Darrell Kennedy is a 39 y.o. with a history of HIV Is here for follow up. Last seen in Dec2020 He has seen Dr.Finnegan who did not think the ACLA was significant He saw Dr.Kowalski -  Statin changed from rosuvastatin to atorvastatin '20mg'$  Gone back to dancing- teaching  No more TIA like episodes with he had in oct 2020 for which he was worked up Sleeping better Coming off seraquil Started lamictal '100mg'$  ( Dr.EEPEN- psychiatrist)    02/12/19   with TIA like episode when he felt the left arm and leg tingling preceded by palpitation. Was transient. underwent work up with Neg MRI, ECHO, TEE which did not show any intracardiac shunting. He saw neurologist Dr.Shah at the [facility] clinic on 02/17/19  and he sent hypercoagulable panel ESR, CRP - Factor V Leiden mutation - Antithrombin III functional assay - MTHFR gene mutation - Protein C and S activity (functional assay) - Homocysteine - Lupus anticoagulant comprehensive - Antiphospholipid syndrome (includes aPTT, PT, INR, Thrombin time, Dilute Viper Venom Time, Hexagonal phase phospholipid, Anticoardiolipin antibody IgG and IgM, Beta - 2 Glycoprotein IgG and IgM,   Anticardiolipin IgM  ab was elevated at 13 ( 0-12 ) N and homocysteine was 16( 0-14.5) Started on methylfolate  and aspirin . seen heme once Dr.Finnegan .   Doing well, on Biktarvy, 100% adherent - no side effects from the medication.    HIV history HIV diagnosed NOV 2015 in  [place] when he had flu like symptoms and knew he had acute HIV and went and got tested. Nadir Cd4 was > 700  He saw Dr.PAul Tamala Julian and was started on isentress and truvada. He later changed to [facility]. Since end of 2017 he was off treatment because of lack of insurance. He moved to [facility] in 2018 and did not engage in care because of lack on insurance. Pt has insurance now [employer] libr[employer])  Nadir  Cd4 744 VL >200,000 OI none HAARt history truvada isentress Genvoya Acquired thru-sex Genotype done in [place] ? Past Medical History:  Diagnosis Date  . Heart murmur   . HIV (human immunodeficiency virus infection) ([place])   . HLD (hyperlipidemia)   . Stroke [facility])     Surgical History -none  FH Father- diabetes, HTN Mother-adrenal tumor removed  ?SH Non smoker No illicit drug use Occasional alcohol Has traveled to [place] in 2013 Lives with his parents  ? Current Outpatient Medications  Medication Sig Dispense Refill  . aspirin (ASPIRIN CHILDRENS) 81 MG chewable tablet Chew 1 tablet (81 mg total) by mouth daily. 120 tablet 0  . atorvastatin (LIPITOR) 20 MG tablet Take 1 tablet (20 mg total) by mouth daily. 90 tablet 3  . bictegravir-emtricitabine-tenofovir AF (BIKTARVY) 50-200-25 MG TABS tablet Take 1 tablet by mouth daily. 30 tablet 4  . lamoTRIgine (LAMICTAL) 100 MG tablet Take 1 tablet (100 mg total) by mouth daily. 90 tablet 0  . Levomefolate Glucosamine (METHYLFOLATE PO) Take 1 mg by mouth daily.    . Multiple Vitamin (MULTIVITAMIN WITH MINERALS) TABS tablet Take 1 tablet by mouth daily.    Marland Kitchen OVER THE COUNTER MEDICATION Levium anxiety supplement    . QUEtiapine (SEROQUEL) 100 MG tablet TAKE 1 TABLET(100 MG) BY MOUTH AT BEDTIME 90 tablet 0  . traZODone (DESYREL) 50 MG tablet Take 1-2 tablets (50-100 mg total) by mouth at bedtime as needed for  sleep. 60 tablet 1   No current facility-administered medications for this visit.    REVIEW OF SYSTEMS:  Const: negative fever, negative chills, negative weight loss Eyes: negative diplopia or visual changes, negative eye pain ENT: negative coryza, negative sore throat Resp: negative cough, hemoptysis, dyspnea Cards: negative for chest pain, palpitations, lower extremity edema GU: negative for frequency, dysuria and hematuria Skin: negative for rash and pruritus Heme: negative for easy bruising and gum/nose bleeding MS: negative  for myalgias, arthralgias, back pain and muscle weakness Neurolo:negative for headaches, dizziness, vertigo, memory problems  Psych: bipolar disorder  Objective:  VITALS:  BP 105/76   Pulse 76   Temp 98.2 F (36.8 C)   Resp 16   Wt 180 lb (81.6 kg)   HC 55" (139.7 cm)   SpO2 95%   BMI 27.37 kg/m  PHYSICAL EXAM:  General: Alert, cooperative, no distress, appears stated age.  Head: Normocephalic, without obvious abnormality, atraumatic. Eyes: Conjunctivae clear, anicteric sclerae. Pupils are equal Nose: Nares normal. No drainage or sinus tenderness. Throat: Lips, mucosa, and tongue normal. No Thrush Neck: Supple, symmetrical, no adenopathy, thyroid: non tender no carotid bruit and no JVD. Back: No CVA tenderness. Lungs: Clear to auscultation bilaterally. No Wheezing or Rhonchi. No rales. Heart: Regular rate and rhythm, no murmur, rub or gallop. Abdomen: Soft, non-tender,not distended. Bowel sounds normal. No masses Extremities: Extremities normal, atraumatic, no cyanosis. No edema. No clubbing Skin: No rashes or lesions. Not Jaundiced Lymph: Cervical, supraclavicular normal. Neurologic: Grossly non-focal Pertinent Labs  IMAGING RESULTS: Health maintenance Vaccination  Vaccine Date last given comment  Influenza 01/24/19   Hepatitis B 05/15/2014,2/29 &11/24/14 [place]  Hepatitis A 05/15/14 &06/15/14   Prevnar-PCV-13 04/02/2014   Pneumovac-PPSV-23 09/10/18   TdaP 05/15/14   HPV 09/23/19   Shingrix ( zoster vaccine)    Corona virus vaccine 08/05/19    ______________________  Labs Lab Result  Date comment  HIV VL 15,300 05/07/18   CD4 458(18.3%) 05/07/18   Genotype Negative 04/09/2014 [facility]- [place]  [id number] NEG 04/02/2014   HIV antibody Reactive 05/07/18   RPR 1:1 ( but TAP NEG 05/07/18 False positive  Quantiferon Gold NR 05/07/18   Hep C ab NR 05/07/18   Hepatitis B-ab,ag,c Ab-positive 05/07/18 vaccinated  Hepatitis A-IgM, IgG /T     Lipid     GC/CHL     PAP  09/23/19    HB,PLT,Cr, LFT 14/284/1.34/N      Preventive  Procedure Result  Date comment  colonoscopy     Mammogram     Dental exam     Opthal       Impression/Recommendation ? HIV- on Biktarvy- doing well- 100% adherent . Will do labs today  TIA like episode- investigated and found to have borderline high Anticardiolipin IgM and homocysteine- on aspirin and methyl folate Followed by heme onc who does not think the labs are significant  Bipolar  disorder- followed by Psychiatrist- on lamictal, seroquel being tapered- trazadone for sleep  Partner notification not needed as pt not sexually active in the past 2 years   anal PAP done today HPV vaccine 1 st dose given today Labs tody  Follow up 6 months ___________________________________________________ Discussed with patient in detail

## 2019-09-23 NOTE — Addendum Note (Signed)
Addended by: Jojuan Champney on: 09/23/2019 10:19 AM   Modules accepted: Orders  

## 2019-09-23 NOTE — Patient Instructions (Signed)
Today we did the pap smear- you got your first dose of HPV vaccine today Will get your 2nd dose in 1 month and 3rd dose in 6 months Will do labs today Continue Biktarvy

## 2019-09-23 NOTE — Addendum Note (Signed)
Addended by: Kazuto Sevey on: 09/23/2019 10:19 AM   Modules accepted: Orders  

## 2019-09-24 ENCOUNTER — Encounter (INDEPENDENT_AMBULATORY_CARE_PROVIDER_SITE_OTHER): Payer: Self-pay

## 2019-09-24 LAB — T-HELPER CELLS CD4/CD8 %
% CD 4 Pos. Lymph.: 31 % (ref 30.8–58.5)
Absolute CD 4 Helper: 713 /uL (ref 359–1519)
Basophils Absolute: 0 10*3/uL (ref 0.0–0.2)
Basos: 0 %
CD3+CD4+ Cells/CD3+CD8+ Cells Bld: 0.65 — ABNORMAL LOW (ref 0.92–3.72)
CD3+CD8+ Cells # Bld: 1095 /uL — ABNORMAL HIGH (ref 109–897)
CD3+CD8+ Cells NFr Bld: 47.6 % — ABNORMAL HIGH (ref 12.0–35.5)
EOS (ABSOLUTE): 0.2 10*3/uL (ref 0.0–0.4)
Eos: 3 %
Hematocrit: 44.3 % (ref 37.5–51.0)
Hemoglobin: 15.1 g/dL (ref 13.0–17.7)
Immature Grans (Abs): 0 10*3/uL (ref 0.0–0.1)
Immature Granulocytes: 0 %
Lymphocytes Absolute: 2.3 10*3/uL (ref 0.7–3.1)
Lymphs: 27 %
MCH: 31 pg (ref 26.6–33.0)
MCHC: 34.1 g/dL (ref 31.5–35.7)
MCV: 91 fL (ref 79–97)
Monocytes Absolute: 0.6 10*3/uL (ref 0.1–0.9)
Monocytes: 7 %
Neutrophils Absolute: 5.5 10*3/uL (ref 1.4–7.0)
Neutrophils: 63 %
Platelets: 266 10*3/uL (ref 150–450)
RBC: 4.87 x10E6/uL (ref 4.14–5.80)
RDW: 11.3 % — ABNORMAL LOW (ref 11.6–15.4)
WBC: 8.6 10*3/uL (ref 3.4–10.8)

## 2019-09-24 LAB — RPR: RPR Ser Ql: NONREACTIVE

## 2019-09-24 LAB — HIV-1 RNA QUANT-NO REFLEX-BLD
HIV 1 RNA Quant: <20 copies/mL
LOG10 HIV-1 RNA: UNDETERMINED log10copy/mL

## 2019-10-15 ENCOUNTER — Telehealth: Payer: Self-pay

## 2019-10-15 DIAGNOSIS — F5105 Insomnia due to other mental disorder: Secondary | ICD-10-CM

## 2019-10-15 DIAGNOSIS — F3172 Bipolar disorder, in full remission, most recent episode hypomanic: Secondary | ICD-10-CM

## 2019-10-15 DIAGNOSIS — F3181 Bipolar II disorder: Secondary | ICD-10-CM | POA: Insufficient documentation

## 2019-10-15 DIAGNOSIS — F419 Anxiety disorder, unspecified: Secondary | ICD-10-CM

## 2019-10-15 MED ORDER — LAMOTRIGINE 150 MG PO TABS: 150.0000 mg | ORAL_TABLET | Freq: Every day | ORAL | 1 refills | Status: DC

## 2019-10-15 MED ORDER — CLONAZEPAM 0.5 MG PO TABS: 0.5000 mg | ORAL_TABLET | ORAL | 1 refills | Status: DC

## 2019-10-15 NOTE — Telephone Encounter (Signed)
Returned call to patient.  He reports he is currently anxious since being off of the Seroquel.  He does not have any significant mood lability or sleep problems.  However he needs something for anxiety.  Will increase Lamictal to 125 mg p.o. daily for the next 2 weeks.  He does have 25 mg from a previous prescription which she can use.  I will send the Lamictal 150 mg which can start in 2 weeks from now to his pharmacy.  Will add Klonopin 0.5 mg as needed for severe anxiety attacks only.  Provided education about benzodiazepine therapy and advised patient to limit use.

## 2019-10-15 NOTE — Telephone Encounter (Signed)
pt states he is having bad anxiety and wanted to know if you can give him something to help with that.

## 2019-10-23 ENCOUNTER — Other Ambulatory Visit: Payer: Self-pay

## 2019-10-23 ENCOUNTER — Ambulatory Visit: Payer: PRIVATE HEALTH INSURANCE | Attending: Family Medicine | Admitting: Infectious Diseases

## 2019-10-23 ENCOUNTER — Ambulatory Visit: Payer: PRIVATE HEALTH INSURANCE

## 2019-10-23 DIAGNOSIS — Z23 Encounter for immunization: Secondary | ICD-10-CM

## 2019-10-29 ENCOUNTER — Other Ambulatory Visit: Payer: Self-pay | Admitting: Psychiatry

## 2019-10-29 DIAGNOSIS — F5105 Insomnia due to other mental disorder: Secondary | ICD-10-CM

## 2019-10-31 ENCOUNTER — Other Ambulatory Visit: Payer: Self-pay | Admitting: Infectious Diseases

## 2019-10-31 DIAGNOSIS — B20 Human immunodeficiency virus [HIV] disease: Secondary | ICD-10-CM

## 2019-11-03 ENCOUNTER — Other Ambulatory Visit: Payer: Self-pay | Admitting: Psychiatry

## 2019-11-03 DIAGNOSIS — F3181 Bipolar II disorder: Secondary | ICD-10-CM

## 2019-11-13 ENCOUNTER — Telehealth (INDEPENDENT_AMBULATORY_CARE_PROVIDER_SITE_OTHER): Payer: PRIVATE HEALTH INSURANCE | Admitting: Psychiatry

## 2019-11-13 ENCOUNTER — Other Ambulatory Visit: Payer: Self-pay

## 2019-11-13 ENCOUNTER — Other Ambulatory Visit: Payer: Self-pay | Admitting: Psychiatry

## 2019-11-13 ENCOUNTER — Encounter: Payer: Self-pay | Admitting: Psychiatry

## 2019-11-13 DIAGNOSIS — F3172 Bipolar disorder, in full remission, most recent episode hypomanic: Secondary | ICD-10-CM | POA: Diagnosis not present

## 2019-11-13 DIAGNOSIS — F5105 Insomnia due to other mental disorder: Secondary | ICD-10-CM | POA: Diagnosis not present

## 2019-11-13 MED ORDER — LAMOTRIGINE 150 MG PO TABS: 150.0000 mg | ORAL_TABLET | Freq: Every day | ORAL | 1 refills | Status: DC

## 2019-11-13 MED ORDER — DOXEPIN HCL 10 MG PO CAPS: 10.0000 mg | ORAL_CAPSULE | Freq: Every evening | ORAL | 1 refills | Status: DC | PRN

## 2019-11-13 NOTE — Patient Instructions (Signed)
Doxepin capsules What is this medicine? DOXEPIN (DOX e pin) is used to treat depression and anxiety. This medicine may be used for other purposes; ask your health care provider or pharmacist if you have questions. COMMON BRAND NAME(S): Sinequan What should I tell my health care provider before I take this medicine? They need to know if you have any of these conditions:  bipolar disorder  difficulty passing urine  glaucoma  heart disease  if you frequently drink alcohol containing drinks  liver disease  lung or breathing disease, like asthma or sleep apnea  prostate trouble  schizophrenia  seizures  suicidal thoughts, plans, or attempt; a previous suicide attempt by you or a family member  an unusual or allergic reaction to doxepin, other medicines, foods, dyes, or preservatives  pregnant or trying to get pregnant  breast-feeding How should I use this medicine? Take this medicine by mouth with a glass of water. Follow the directions on the prescription label. Take your doses at regular intervals. Do not take your medicine more often than directed. Do not stop taking this medicine suddenly except upon the advice of your doctor. Stopping this medicine too quickly may cause serious side effects or your condition may worsen. A special MedGuide will be given to you by the pharmacist with each prescription and refill. Be sure to read this information carefully each time. Talk to your pediatrician regarding the use of this medicine in children. While this drug may be prescribed for children as young as 12 years for selected conditions, precautions do apply. Overdosage: If you think you have taken too much of this medicine contact a poison control center or emergency room at once. NOTE: This medicine is only for you. Do not share this medicine with others. What if I miss a dose? If you miss a dose, take it as soon as you can. If it is almost time for your next dose, take only that  dose. Do not take double or extra doses. What may interact with this medicine? Do not take this medicine with any of the following medications:  arsenic trioxide  certain medicines used to regulate abnormal heartbeat or to treat other heart conditions  cisapride  halofantrine  levomethadyl  linezolid  MAOIs like Carbex, Eldepryl, Marplan, Nardil, and Parnate  methylene blue  other medicines for mental depression  phenothiazines like perphenazine, thioridazine and chlorpromazine  pimozide  procarbazine  sparfloxacin  St. John's Wort This medicine may also interact with the following medications:  cimetidine  tolazamide  ziprasidone This list may not describe all possible interactions. Give your health care provider a list of all the medicines, herbs, non-prescription drugs, or dietary supplements you use. Also tell them if you smoke, drink alcohol, or use illegal drugs. Some items may interact with your medicine. What should I watch for while using this medicine? Visit your doctor or health care professional for regular checks on your progress. It can take several days before you feel the full effect of this medicine. If you have been taking this medicine regularly for some time, do not suddenly stop taking it. You must gradually reduce the dose or you may get severe side effects. Ask your doctor or health care professional for advice. Even after you stop taking this medicine it can still affect your body for several days. Patients and their families should watch out for new or worsening thoughts of suicide or depression. Also watch out for sudden changes in feelings such as feeling anxious, agitated, panicky,   irritable, hostile, aggressive, impulsive, severely restless, overly excited and hyperactive, or not being able to sleep. If this happens, especially at the beginning of treatment or after a change in dose, call your health care professional. You may get drowsy or  dizzy. Do not drive, use machinery, or do anything that needs mental alertness until you know how this medicine affects you. Do not stand or sit up quickly, especially if you are an older patient. This reduces the risk of dizzy or fainting spells. Alcohol may increase dizziness and drowsiness. Avoid alcoholic drinks. Do not treat yourself for coughs, colds, or allergies without asking your doctor or health care professional for advice. Some ingredients can increase possible side effects. Your mouth may get dry. Chewing sugarless gum or sucking hard candy, and drinking plenty of water may help. Contact your doctor if the problem does not go away or is severe. This medicine may cause dry eyes and blurred vision. If you wear contact lenses you may feel some discomfort. Lubricating drops may help. See your eye doctor if the problem does not go away or is severe. This medicine can make you more sensitive to the sun. Keep out of the sun. If you cannot avoid being in the sun, wear protective clothing and use sunscreen. Do not use sun lamps or tanning beds/booths. What side effects may I notice from receiving this medicine? Side effects that you should report to your doctor or health care professional as soon as possible:  allergic reactions like skin rash, itching or hives, swelling of the face, lips, or tongue  anxious  breathing problems  changes in vision  confusion  elevated mood, decreased need for sleep, racing thoughts, impulsive behavior  eye pain  fast, irregular heartbeat  feeling faint or lightheaded, falls  feeling agitated, angry, or irritable  fever with increased sweating  hallucination, loss of contact with reality  seizures  stiff muscles  suicidal thoughts or other mood changes  tingling, pain, or numbness in the feet or hands  trouble passing urine or change in the amount of urine  trouble sleeping  unusually weak or tired  vomiting  yellowing of the eyes  or skin Side effects that usually do not require medical attention (report to your doctor or health care professional if they continue or are bothersome):  change in sex drive or performance  change in appetite or weight  constipation  dizziness  dry mouth  nausea  tired  tremors  upset stomach This list may not describe all possible side effects. Call your doctor for medical advice about side effects. You may report side effects to FDA at 1-800-FDA-1088. Where should I keep my medicine? Keep out of the reach of children. Store at room temperature between 15 and 30 degrees C (59 and 86 degrees F). Throw away any unused medicine after the expiration date. NOTE: This sheet is a summary. It may not cover all possible information. If you have questions about this medicine, talk to your doctor, pharmacist, or health care provider.  2020 Elsevier/Gold Standard (2018-03-26 13:13:29)  

## 2019-11-13 NOTE — Progress Notes (Signed)
Provider Location : [facility] Patient Location : Home  Virtual Visit via Video Note  I connected with Darrell Kennedy on 11/13/19 at  2:00 PM EDT by a video enabled telemedicine application and verified that I am speaking with the correct person using two identifiers.   I discussed the limitations of evaluation and management by telemedicine and the availability of in person appointments. The patient expressed understanding and agreed to proceed.   I discussed the assessment and treatment plan with the patient. The patient was provided an opportunity to ask questions and all were answered. The patient agreed with the plan and demonstrated an understanding of the instructions.   The patient was advised to call back or seek an in-person evaluation if the symptoms worsen or if the condition fails to improve as anticipated.  BH MD OP Progress Note  11/13/2019 6:06 PM Darrell Kennedy  MRN:  [id number]  Chief Complaint:  Chief Complaint    Follow-up     HPI: Darrell Kennedy is a 39 year old Caucasian male, lives in [place], has a history of bipolar disorder, hyperlipidemia, HIV positive, recent TIA was evaluated by telemedicine today.  Patient today reports he is currently making progress on the current medication dosage of Lamictal.  He reports the Lamictal 150 mg definitely helps him with his mood.  He denies any significant mood swings at this time.  He however reports that he continues to struggle with sleep.  He is trying to work on sleep hygiene however in spite of that he continues to have sleep issues.  The trazodone does not help as much as the Seroquel.  He is interested in a new sleep aid.  Patient reports he is currently looking for a new therapist since his therapist left the company.  He reports however that he is coping okay overall.  Patient denies any suicidality, homicidality or perceptual disturbances.  Patient denies any other concerns today.  Visit Diagnosis:     ICD-10-CM   1. Bipolar disorder, in full remission, most recent episode hypomanic (HCC)  F31.72 lamoTRIgine (LAMICTAL) 150 MG tablet  2. Insomnia due to mental condition  F51.05 doxepin (SINEQUAN) 10 MG capsule    Past Psychiatric History: I have reviewed past psychiatric history from my progress note on 02/06/2019.  Past trials of BuSpar, Seroquel  Past Medical History:  Past Medical History:  Diagnosis Date  . Heart murmur   . HIV (human immunodeficiency virus infection) (HCC)   . HLD (hyperlipidemia)   . Stroke [facility])     Past Surgical History:  Procedure Laterality Date  . TEE WITHOUT CARDIOVERSION N/A 03/05/2019   Procedure: TRANSESOPHAGEAL ECHOCARDIOGRAM (TEE);  Surgeon: Lamar Blinks, MD;  Location: [facility] ORS;  Service: Cardiovascular;  Laterality: N/A;    Family Psychiatric History: I have reviewed family psychiatric history from my progress note on 02/06/2019.  Family History:  Family History  Problem Relation Age of Onset  . Hypertension Father   . Diabetes Father   . Kidney disease Father   . Depression Father   . Hyperlipidemia Father   . Alcohol abuse Brother   . Heart disease Paternal Grandmother   . Drug abuse Cousin     Social History: Reviewed social history from my progress note on 02/06/2019. Social History   Socioeconomic History  . Marital status: Single    Spouse name: Not on file  . Number of children: 0  . Years of education: Not on file  . Highest education level: Master's degree (  e.g., MA, MS, MEng, MEd, MSW, MBA)  Occupational History  . Occupation: Consulting civil engineer  Tobacco Use  . Smoking status: Never Smoker  . Smokeless tobacco: Never Used  Vaping Use  . Vaping Use: Never used  Substance and Sexual Activity  . Alcohol use: Yes    Comment: social maybe once a week  . Drug use: Never  . Sexual activity: Yes    Birth control/protection: Condom  Other Topics Concern  . Not on file  Social History Narrative  . Not on file   Social  Determinants of Health   Financial Resource Strain:   . Difficulty of Paying Living Expenses:   Food Insecurity:   . Worried About Programme researcher, broadcasting/film/video in the Last Year:   . Barista in the Last Year:   Transportation Needs:   . Freight forwarder (Medical):   Marland Kitchen Lack of Transportation (Non-Medical):   Physical Activity:   . Days of Exercise per Week:   . Minutes of Exercise per Session:   Stress: Stress Concern Present  . Feeling of Stress : Very much  Social Connections: Unknown  . Frequency of Communication with Friends and Family: Not on file  . Frequency of Social Gatherings with Friends and Family: Not on file  . Attends Religious Services: Not on file  . Active Member of Clubs or Organizations: Not on file  . Attends Banker Meetings: Not on file  . Marital Status: Never married    Allergies:  Allergies  Allergen Reactions  . Amoxicillin Rash    Metabolic Disorder Labs: Lab Results  Component Value Date   HGBA1C 5.6 02/12/2019   MPG 114.02 02/12/2019   No results found for: PROLACTIN Lab Results  Component Value Date   CHOL 161 09/23/2019   TRIG 70 09/23/2019   HDL 58 09/23/2019   CHOLHDL 2.8 09/23/2019   VLDL 14 09/23/2019   LDLCALC 89 09/23/2019   LDLCALC 78 02/12/2019   Lab Results  Component Value Date   TSH 1.78 02/12/2019    Therapeutic Level Labs: No results found for: LITHIUM No results found for: VALPROATE No components found for:  CBMZ  Current Medications: Current Outpatient Medications  Medication Sig Dispense Refill  . aspirin (ASPIRIN CHILDRENS) 81 MG chewable tablet Chew 1 tablet (81 mg total) by mouth daily. 120 tablet 0  . atorvastatin (LIPITOR) 20 MG tablet Take 1 tablet (20 mg total) by mouth daily. 90 tablet 3  . bictegravir-emtricitabine-tenofovir AF (BIKTARVY) 50-200-25 MG TABS tablet Take 1 tablet by mouth daily. 30 tablet 4  . clonazePAM (KLONOPIN) 0.5 MG tablet Take 1 tablet (0.5 mg total) by mouth as  directed. Take 1 tablet daily up to 2-3 times a week for severe anxiety attacks only 12 tablet 1  . doxepin (SINEQUAN) 10 MG capsule Take 1-2 capsules (10-20 mg total) by mouth at bedtime as needed. For sleep 60 capsule 1  . doxycycline (VIBRAMYCIN) 100 MG capsule Take 100 mg by mouth 2 (two) times daily.    Marland Kitchen lamoTRIgine (LAMICTAL) 150 MG tablet Take 1 tablet (150 mg total) by mouth daily. 30 tablet 1  . Levomefolate Glucosamine (METHYLFOLATE PO) Take 1 mg by mouth daily.    . Multiple Vitamin (MULTIVITAMIN WITH MINERALS) TABS tablet Take 1 tablet by mouth daily.    Marland Kitchen OVER THE COUNTER MEDICATION Levium anxiety supplement    . predniSONE (DELTASONE) 10 MG tablet 6-day taper; 6 pills day one, 5 pills day 2, 4 pills  day 3, etc. Follow package instructions.    . predniSONE (DELTASONE) 10 MG tablet Take by mouth.     No current facility-administered medications for this visit.     Musculoskeletal: Strength & Muscle Tone: UTA Gait & Station: normal Patient leans: N/A  Psychiatric Specialty Exam: Review of Systems  Psychiatric/Behavioral: Positive for sleep disturbance.  All other systems reviewed and are negative.   There were no vitals taken for this visit.There is no height or weight on file to calculate BMI.  General Appearance: Casual  Eye Contact:  Fair  Speech:  Clear and Coherent  Volume:  Normal  Mood:  Euthymic  Affect:  Congruent  Thought Process:  Goal Directed and Descriptions of Associations: Intact  Orientation:  Full (Time, Place, and Person)  Thought Content: Logical   Suicidal Thoughts:  No  Homicidal Thoughts:  No  Memory:  Immediate;   Fair Recent;   Fair Remote;   Fair  Judgement:  Fair  Insight:  Fair  Psychomotor Activity:  Normal  Concentration:  Concentration: Fair and Attention Span: Fair  Recall:  [employer] of Knowledge: Fair  Language: Fair  Akathisia:  No  Handed:  Right  AIMS (if indicated): UTA  Assets:  Communication Skills Desire for  Improvement Housing Social Support Talents/Skills Transportation Vocational/Educational  ADL's:  Intact  Cognition: WNL  Sleep:  Poor   Screenings: AUDIT     Office Visit from 10/07/2018 in [facility]  Alcohol Use Disorder Identification Test Final Score (AUDIT) 3    PHQ2-9     Office Visit from 03/27/2019 in [facility] Office Visit from 02/20/2019 in [facility] Office Visit from 11/25/2018 in [facility] Office Visit from 10/07/2018 in [facility]  PHQ-2 Total Score 0 2 2 2   PHQ-9 Total Score 2 10 11 2        Assessment and Plan: Darrell Kennedy is a 39 year old Caucasian male, student, employed, lives in [place] with his parents, has a history of bipolar disorder, hyperlipidemia, HIV positive, anticardiolipin antibodies was evaluated by telemedicine today.  Patient is biologically predisposed given his family history of substance abuse problems and health problems.  Patient is currently making progress on the Lamictal however continues to struggle with sleep.  Plan as noted below.  Plan Bipolar disorder in remission Lamictal 150 mg p.o. daily Hydroxyzine 10 mg p.o. daily as needed for anxiety attacks  Insomnia-unstable Discontinue trazodone for lack of benefit Start doxepin 10 to 20 mg p.o. nightly Discussed sleep hygiene techniques  Patient to continue psychotherapy sessions as needed  Follow-up in clinic in 4 to 5 weeks or sooner if needed.  I have spent atleast 20 minutes non face to face with patient today. More than 50 % of the time was spent for preparing to see the patient ( e.g., review of test, records ), ordering medications and test ,psychoeducation and supportive psychotherapy and care coordination,as well as documenting clinical information in electronic health record. This note was generated in part or whole with voice recognition software. Voice recognition is  usually quite accurate but there are transcription errors that can and very often do occur. I apologize for any typographical errors that were not detected and corrected.        20, MD 11/13/2019, 6:06 PM

## 2019-11-24 ENCOUNTER — Ambulatory Visit (INDEPENDENT_AMBULATORY_CARE_PROVIDER_SITE_OTHER): Payer: PRIVATE HEALTH INSURANCE | Admitting: Licensed Clinical Social Worker

## 2019-11-24 ENCOUNTER — Other Ambulatory Visit: Payer: Self-pay

## 2019-11-24 DIAGNOSIS — F3172 Bipolar disorder, in full remission, most recent episode hypomanic: Secondary | ICD-10-CM | POA: Diagnosis not present

## 2019-11-24 NOTE — Patient Instructions (Signed)
Caring for Your Mental Health Mental health is emotional, psychological, and social well-being. Mental health is just as important as physical health. In fact, mental and physical health are connected, and you need both to be healthy. Some signs of good mental health (well-being) include:  Being able to attend to tasks at home, school, or work.  Being able to manage stress and emotions.  Practicing self-care, which may include: ? A regular exercise pattern. ? A reasonably healthy diet. ? Supportive and trusting relationships. ? The ability to relax and calm yourself (self-calm).  Having pleasurable hobbies and activities to do.  Believing that you have meaning and purpose in your life.  Recovering and adjusting after facing challenges (resilience). You can take steps to build or strengthen these mentally healthy behaviors. There are resources and support to help you with this. Why is caring for mental health important? Caring for your mental health is a big part of staying healthy. Everyone has times when feelings, thoughts, or situations feel overwhelming. Mental health means having the skills to manage what feels overwhelming. If this sense of being overwhelmed persists, however, you might need some help. If you have some of the following signs, you may need to take better care of your mental health or seek help from a health care provider or mental health professional:  Problems with energy or focus.  Changes in eating habits.  Problems sleeping, such as sleeping too much or not enough.  Emotional distress, such as anger, sadness, depression, or anxiety.  Major changes in your relationships.  Losing interest in life or activities that you used to enjoy. If you have any of these symptoms on most days for 2 weeks or longer:  Talk with a close friend or family member about how you are feeling.  Contact your health care provider to discuss your symptoms.  Consider working with a  mental health professional. Your health care provider, family, or friends may be able to recommend a therapist. What can I do to promote emotional and mental health? Managing emotions  Learn to identify emotions and deal with them. Recognizing your emotions is the first step in learning to deal with them.  Practice ways to appropriately express feelings. Remember that you can control your feelings. They do not control you.  Practice stress management techniques, such as: ? Relaxation techniques, like breathing or muscle relaxation exercises. ? Exercise. Regular activity can lower your stress level. ? Changing what you can change and accepting what you cannot change.  Build up your resilience so that you can recover and adjust after big problems or challenges. Practice resilient behaviors and attitudes: ? Set and focus on long-term goals. ? Develop and maintain healthy, supportive relationships. ? Learn to accept change and make the best of the situation. ? Take care of yourself physically by eating a healthy diet, getting plenty of sleep, and exercising regularly. ? Develop self-awareness. Ask others to give feedback about how they see you. ? Practice mindfulness meditation to help you stay calm when dealing with daily challenges. ? Learn to respond to situations in healthy ways, rather than reacting with your emotions. ? Keep a positive attitude, and believe in yourself. Your view of yourself affects your mental health. ? Develop your listening and empathy skills. These will help you deal with difficult situations and communications.  Remember that emotions can be used as a good source of communication and are a great source of energy. Try to laugh and find humor in life.   Sleeping  Get the right amount and quality of sleep. Sleep has a big impact on physical and mental health. To improve your sleep: ? Go to bed and wake up around the same time every day. ? Limit screen time before  bedtime. This includes the use of your cell phone, TV, computer, and tablet. ? Keep your bedroom dark and cool. Activity   Exercise or do some physical activity regularly. This helps: ? Keep your body strong, especially during times of stress. ? Get rid of chemicals in your body (hormones) that build up when you are stressed. ? Build up your resilience. Eating and drinking   Eat a healthy diet that includes whole grains, vegetables, fresh fruits, and lean proteins. If you have questions about what foods are best for you, ask your health care provider.  Try not to turn to sweet, salty, or otherwise unhealthy foods when you are tired or unhappy. This can lead to unwanted weight gain and is not a healthy way to cope with emotions. Where to find more information You can find more information about how to care for your mental health from:  National Alliance on Mental Illness (NAMI): www.nami.org  National Institute of Mental Health: www.nimh.nih.gov  Centers for Disease Control and Prevention: www.cdc.gov/hrqol/wellbeing.htm Contact a health care provider if:  You lose interest in being with others or you do not want to leave the house.  You have a hard time completing your normal activities or you have less energy than normal.  You cannot stay focused or you have problems with memory.  You feel that your senses are heightened, and this makes you upset or concerned.  You feel nervous or have rapid mood changes.  You are sleeping or eating more or less than normal.  You question reality or you show odd behavior that disturbs you or others. Get help right away if:  You have thoughts about hurting yourself or others. If you ever feel like you may hurt yourself or others, or have thoughts about taking your own life, get help right away. You can go to your nearest emergency department or call:  Your local emergency services (911 in the [place]  A suicide crisis helpline, such as the  National Suicide Prevention Lifeline at 1-800-273-8255. This is open 24 hours a day. Summary  Mental health is not just the absence of mental illness. It involves understanding your emotions and behaviors, and taking steps to cope with them in a healthy way.  If you have symptoms of mental or emotional distress, get help from family, friends, a health care provider, or a mental health professional.  Practice good mental health behaviors such as stress management skills, self-calming skills, exercise, and healthy sleeping and eating. This information is not intended to replace advice given to you by your health care provider. Make sure you discuss any questions you have with your health care provider. Document Revised: 03/16/2017 Document Reviewed: 08/15/2016 Elsevier Patient Education  2020 Elsevier Inc.  

## 2019-11-24 NOTE — Progress Notes (Signed)
Virtual Visit via Video Note  I connected with Darrell Kennedy on 11/24/19 at  2:30 PM EDT by a video enabled telemedicine application and verified that I am speaking with the correct person using two identifiers.  Location: Patient: home Provider: remote office [place], [place])   I discussed the limitations of evaluation and management by telemedicine and the availability of in person appointments. The patient expressed understanding and agreed to proceed.   I discussed the assessment and treatment plan with the patient. The patient was provided an opportunity to ask questions and all were answered. The patient agreed with the plan and demonstrated an understanding of the instructions.   The patient was advised to call back or seek an in-person evaluation if the symptoms worsen or if the condition fails to improve as anticipated.  I provided 60 minutes of non-face-to-face time during this encounter.   Riata Ikeda R Vanecia Limpert, LCSW    THERAPIST PROGRESS NOTE  Session Time: 2:30-3:30 pm  Participation Level: Active  Behavioral Response: Neat and Well GroomedAlertgenerally pleasant  Type of Therapy: Individual Therapy  Treatment Goals addressed: Coping  Interventions: Supportive  Summary: Darrell Kennedy is a 39 y.o. male who presents with improving symptoms related to diagnosis of bipolar disorder.  Session today was first session between patient/clinician--allowed pt to express thoughts and feelings about previous counseling experiences and current levels of functioning.  Pt explained mood shifts and behavior triggers. Pt very self-aware and has good coping mechanisms.   Allowed pt to share lifelong experience with mood changes/anxiety/stress and psychological impact of current stressors.  Suicidal/Homicidal: No  Therapist Response: First counseling appt with pt after Darrell Kennedy referral.   Plan: Return again in 3 weeks. Ongoing treatment plan to include to maintain current  levels of functioning, mood stability, self-care, life balance, and managing anxiety and stress.   Diagnosis: Axis I: Bipolar, Manic    Axis II: No diagnosis    Darrell Haber Dewitt Judice, LCSW 11/24/2019

## 2019-11-28 ENCOUNTER — Other Ambulatory Visit: Payer: Self-pay | Admitting: Psychiatry

## 2019-11-28 DIAGNOSIS — F3181 Bipolar II disorder: Secondary | ICD-10-CM

## 2019-12-09 NOTE — Patient Instructions (Signed)
Shot given

## 2019-12-11 ENCOUNTER — Other Ambulatory Visit: Payer: Self-pay | Admitting: Psychiatry

## 2019-12-11 DIAGNOSIS — F3172 Bipolar disorder, in full remission, most recent episode hypomanic: Secondary | ICD-10-CM

## 2019-12-16 ENCOUNTER — Ambulatory Visit (INDEPENDENT_AMBULATORY_CARE_PROVIDER_SITE_OTHER): Payer: PRIVATE HEALTH INSURANCE | Admitting: Licensed Clinical Social Worker

## 2019-12-16 ENCOUNTER — Other Ambulatory Visit: Payer: Self-pay

## 2019-12-16 DIAGNOSIS — F5105 Insomnia due to other mental disorder: Secondary | ICD-10-CM

## 2019-12-16 DIAGNOSIS — F3172 Bipolar disorder, in full remission, most recent episode hypomanic: Secondary | ICD-10-CM | POA: Diagnosis not present

## 2019-12-16 NOTE — Progress Notes (Addendum)
Virtual Visit via Video Note  I connected with Darrell Kennedy on 12/16/19 at  2:30 PM EDT by a video enabled telemedicine application and verified that I am speaking with the correct person using two identifiers.  Location: Patient: home Provider: [facility]   I discussed the limitations of evaluation and management by telemedicine and the availability of in person appointments. The patient expressed understanding and agreed to proceed.   The patient was advised to call back or seek an in-person evaluation if the symptoms worsen or if the condition fails to improve as anticipated.  I provided 60 minutes of non-face-to-face time during this encounter.   Allicia Culley R Jalyne Brodzinski, LCSW    THERAPIST PROGRESS NOTE  Session Time: 2:30-3:30 pm  Participation Level: Active  Behavioral Response: Neat and Well GroomedAlertAnxious  Type of Therapy: Individual Therapy  Treatment Goals addressed: Anxiety and Coping  Interventions: CBT and Supportive  Summary: Darrell Kennedy "Darrell Kennedy" is a 39 y.o. male who presents with continuing stress associated with current job search. Pt reports that he is compliant with medication and has a varied set of coping mechanisms.  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 Brainstormed through ways that pt could improve "putting himself out there". Discussed pros and cons of geographic locations--pt has lived in [place] in the past and feels cost of living too high; [place] would be too competitive; pt expressed [place] could be nice location.   Discussed pt feeling "stuck" with job search/life plan right now.  Discussed/developed short term and long term goals with patient (personal goals) and steps that pt needs to follow to continue on that path. Discussed cognitive flexibility and cognitive dissonance.   Suicidal/Homicidal: No  SI, HI, or AVH reported at time of session.  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 Symptoms are triggering some days with insomnia. This is reflective of fluctuating/intermittent progress.   Plan: Return again in 3 weeks. Ongoing treatment plan to include continuing building mood and emotion regulation skills, reinforcing healthy coping mechanisms, and understanding the psychological impact of historical events and how it impacts present and future events.  Diagnosis: Axis I: Bipolar, mixed    Axis II: No diagnosis    Ernest Haber Ojani Berenson, LCSW 12/16/2019

## 2019-12-23 ENCOUNTER — Telehealth: Payer: Self-pay

## 2019-12-23 DIAGNOSIS — F5105 Insomnia due to other mental disorder: Secondary | ICD-10-CM

## 2019-12-23 DIAGNOSIS — F3172 Bipolar disorder, in full remission, most recent episode hypomanic: Secondary | ICD-10-CM

## 2019-12-23 MED ORDER — ZOLPIDEM TARTRATE 5 MG PO TABS: 5.0000 mg | ORAL_TABLET | Freq: Every evening | ORAL | 0 refills | Status: DC | PRN

## 2019-12-23 NOTE — Telephone Encounter (Signed)
Returned call to patient.  He reports doxepin was not helpful.  Will stop doxepin.  Start Ambien 5 mg p.o. nightly  Provided medication education.

## 2019-12-23 NOTE — Telephone Encounter (Signed)
pt called left message that the doxepin is not working and is there anything else to help

## 2019-12-24 ENCOUNTER — Other Ambulatory Visit: Payer: Self-pay

## 2019-12-24 ENCOUNTER — Telehealth: Payer: Self-pay

## 2019-12-24 DIAGNOSIS — B20 Human immunodeficiency virus [HIV] disease: Secondary | ICD-10-CM

## 2019-12-24 MED ORDER — BIKTARVY 50-200-25 MG PO TABS: 1.0000 | ORAL_TABLET | Freq: Every day | ORAL | 4 refills | Status: DC

## 2019-12-24 NOTE — Telephone Encounter (Signed)
Pt needs appt for refills on chol med

## 2019-12-24 NOTE — Telephone Encounter (Signed)
Called pt to schedule a medication refill appt. Pt states he does not need this medication at this time but he did schedule a CPE

## 2019-12-30 ENCOUNTER — Other Ambulatory Visit: Payer: Self-pay

## 2019-12-30 ENCOUNTER — Telehealth (INDEPENDENT_AMBULATORY_CARE_PROVIDER_SITE_OTHER): Payer: PRIVATE HEALTH INSURANCE | Admitting: Psychiatry

## 2019-12-30 ENCOUNTER — Encounter: Payer: Self-pay | Admitting: Psychiatry

## 2019-12-30 DIAGNOSIS — F5105 Insomnia due to other mental disorder: Secondary | ICD-10-CM

## 2019-12-30 DIAGNOSIS — F3172 Bipolar disorder, in full remission, most recent episode hypomanic: Secondary | ICD-10-CM | POA: Diagnosis not present

## 2019-12-30 MED ORDER — ZOLPIDEM TARTRATE 10 MG PO TABS: 10.0000 mg | ORAL_TABLET | Freq: Every evening | ORAL | 0 refills | Status: DC | PRN

## 2019-12-30 NOTE — Progress Notes (Signed)
Provider Location : [facility] Patient Location : Home  Participants: Patient , Provider  Virtual Visit via Video Note  I connected with Darrell Kennedy on 12/30/19 at 10:30 AM EDT by a video enabled telemedicine application and verified that I am speaking with the correct person using two identifiers.   I discussed the limitations of evaluation and management by telemedicine and the availability of in person appointments. The patient expressed understanding and agreed to proceed.    I discussed the assessment and treatment plan with the patient. The patient was provided an opportunity to ask questions and all were answered. The patient agreed with the plan and demonstrated an understanding of the instructions.   The patient was advised to call back or seek an in-person evaluation if the symptoms worsen or if the condition fails to improve as anticipated.   BH MD OP Progress Note  12/30/2019 12:50 PM Darrell Kennedy  MRN:  [id number]  Chief Complaint:  Chief Complaint    Follow-up     HPI: Darrell Kennedy is a 39 year old Caucasian male, lives in [place], has a history of bipolar disorder, hyperlipidemia, HIV positive, was evaluated by telemedicine today.  Patient reports he is currently sleeping better on the Ambien.  He tried the 5 mg which did not help much and so he increased it to a 10 mg.  He reports since being on the 10 mg he has been resting well.  He reports he does not need to take it every night and has been trying to limit use.  Since he is sleeping better he reports his mood symptoms are more stable.  He continues to be compliant on the Lamictal.  Denies side effects.  Patient reports work is stressful however he is able to cope with it.  He has started psychotherapy sessions with therapist Ms.Hussami and reports therapy sessions as going well.  Patient denies any suicidality, homicidality or perceptual disturbances.  Patient denies any other concerns today.  Visit  Diagnosis:    ICD-10-CM   1. Bipolar disorder, in full remission, most recent episode hypomanic (HCC)  F31.72   2. Insomnia due to mental condition  F51.05 zolpidem (AMBIEN) 10 MG tablet    Past Psychiatric History: I have reviewed past psychiatric history from my progress note on 02/06/2019.  Past trials of BuSpar, Seroquel.  Past Medical History:  Past Medical History:  Diagnosis Date  . Heart murmur   . HIV (human immunodeficiency virus infection) (HCC)   . HLD (hyperlipidemia)   . Stroke [facility])     Past Surgical History:  Procedure Laterality Date  . TEE WITHOUT CARDIOVERSION N/A 03/05/2019   Procedure: TRANSESOPHAGEAL ECHOCARDIOGRAM (TEE);  Surgeon: Lamar Blinks, MD;  Location: [facility] ORS;  Service: Cardiovascular;  Laterality: N/A;    Family Psychiatric History: I have reviewed family psychiatric history from my progress note on 02/06/2019.  Family History:  Family History  Problem Relation Age of Onset  . Hypertension Father   . Diabetes Father   . Kidney disease Father   . Depression Father   . Hyperlipidemia Father   . Alcohol abuse Brother   . Heart disease Paternal Grandmother   . Drug abuse Cousin     Social History: I have reviewed social history from my progress note on 02/06/2019. Social History   Socioeconomic History  . Marital status: Single    Spouse name: Not on file  . Number of children: 0  . Years of education: Not on file  .  Highest education level: Master's degree (e.g., MA, MS, MEng, MEd, MSW, MBA)  Occupational History  . Occupation: Consulting civil engineer  Tobacco Use  . Smoking status: Never Smoker  . Smokeless tobacco: Never Used  Vaping Use  . Vaping Use: Never used  Substance and Sexual Activity  . Alcohol use: Yes    Comment: social maybe once a week  . Drug use: Never  . Sexual activity: Yes    Birth control/protection: Condom  Other Topics Concern  . Not on file  Social History Narrative  . Not on file   Social Determinants of  Health   Financial Resource Strain:   . Difficulty of Paying Living Expenses: Not on file  Food Insecurity:   . Worried About Programme researcher, broadcasting/film/video in the Last Year: Not on file  . Ran Out of Food in the Last Year: Not on file  Transportation Needs:   . Lack of Transportation (Medical): Not on file  . Lack of Transportation (Non-Medical): Not on file  Physical Activity:   . Days of Exercise per Week: Not on file  . Minutes of Exercise per Session: Not on file  Stress: Stress Concern Present  . Feeling of Stress : Very much  Social Connections: Unknown  . Frequency of Communication with Friends and Family: Not on file  . Frequency of Social Gatherings with Friends and Family: Not on file  . Attends Religious Services: Not on file  . Active Member of Clubs or Organizations: Not on file  . Attends Banker Meetings: Not on file  . Marital Status: Never married    Allergies:  Allergies  Allergen Reactions  . Amoxicillin Rash    Metabolic Disorder Labs: Lab Results  Component Value Date   HGBA1C 5.6 02/12/2019   MPG 114.02 02/12/2019   No results found for: PROLACTIN Lab Results  Component Value Date   CHOL 161 09/23/2019   TRIG 70 09/23/2019   HDL 58 09/23/2019   CHOLHDL 2.8 09/23/2019   VLDL 14 09/23/2019   LDLCALC 89 09/23/2019   LDLCALC 78 02/12/2019   Lab Results  Component Value Date   TSH 1.78 02/12/2019    Therapeutic Level Labs: No results found for: LITHIUM No results found for: VALPROATE No components found for:  CBMZ  Current Medications: Current Outpatient Medications  Medication Sig Dispense Refill  . aspirin (ASPIRIN CHILDRENS) 81 MG chewable tablet Chew 1 tablet (81 mg total) by mouth daily. 120 tablet 0  . atorvastatin (LIPITOR) 20 MG tablet Take 1 tablet (20 mg total) by mouth daily. 90 tablet 3  . bictegravir-emtricitabine-tenofovir AF (BIKTARVY) 50-200-25 MG TABS tablet Take 1 tablet by mouth daily. 30 tablet 4  . clonazePAM  (KLONOPIN) 0.5 MG tablet Take 1 tablet (0.5 mg total) by mouth as directed. Take 1 tablet daily up to 2-3 times a week for severe anxiety attacks only 12 tablet 1  . lamoTRIgine (LAMICTAL) 150 MG tablet TAKE 1 TABLET(150 MG) BY MOUTH DAILY 30 tablet 1  . Levomefolate Glucosamine (METHYLFOLATE PO) Take 1 mg by mouth daily.    . Multiple Vitamin (MULTIVITAMIN WITH MINERALS) TABS tablet Take 1 tablet by mouth daily.    Marland Kitchen OVER THE COUNTER MEDICATION Levium anxiety supplement    . zolpidem (AMBIEN) 10 MG tablet Take 1 tablet (10 mg total) by mouth at bedtime as needed for sleep. 90 tablet 0   No current facility-administered medications for this visit.     Musculoskeletal: Strength & Muscle Tone: UTA Gait &  Station: normal Patient leans: N/A  Psychiatric Specialty Exam: Review of Systems  Psychiatric/Behavioral: Positive for sleep disturbance (Improving). Negative for agitation, behavioral problems, confusion, decreased concentration, dysphoric mood, hallucinations, self-injury and suicidal ideas. The patient is not nervous/anxious and is not hyperactive.   All other systems reviewed and are negative.   There were no vitals taken for this visit.There is no height or weight on file to calculate BMI.  General Appearance: Casual  Eye Contact:  Fair  Speech:  Clear and Coherent  Volume:  Normal  Mood:  Euthymic  Affect:  Congruent  Thought Process:  Goal Directed and Descriptions of Associations: Intact  Orientation:  Full (Time, Place, and Person)  Thought Content: Logical   Suicidal Thoughts:  No  Homicidal Thoughts:  No  Memory:  Immediate;   Fair Recent;   Fair Remote;   Fair  Judgement:  Fair  Insight:  Fair  Psychomotor Activity:  Normal  Concentration:  Concentration: Fair and Attention Span: Fair  Recall:  [employer] of Knowledge: Fair  Language: Fair  Akathisia:  No  Handed:  Right  AIMS (if indicated): UTA  Assets:  Communication Skills Desire for  Improvement Housing Social Support  ADL's:  Intact  Cognition: WNL  Sleep:  Improving   Screenings: AUDIT     Office Visit from 10/07/2018 in [facility]  Alcohol Use Disorder Identification Test Final Score (AUDIT) 3    PHQ2-9     Office Visit from 03/27/2019 in [facility] Office Visit from 02/20/2019 in [facility] Office Visit from 11/25/2018 in [facility] Office Visit from 10/07/2018 in [facility]  PHQ-2 Total Score 0 2 2 2   PHQ-9 Total Score 2 10 11 2        Assessment and Plan: Darrell Kennedy is a 39 year old Caucasian male, student, employed, lives in [place], has a history of bipolar disorder, hyperlipidemia, HIV positive, anticardiolipin antibodies was evaluated by telemedicine today.  Patient is biologically predisposed given his family history of substance abuse and health problems.  Patient is currently struggling with psychosocial stressors of job related stressors however is making progress.  Plan as noted below.  Plan Bipolar disorder in remission Lamictal 150 mg p.o. daily Hydroxyzine 10 mg p.o. daily as needed for severe anxiety attacks  Insomnia-improving Increase Ambien to 10 mg p.o. nightly.  He has been taking the 10 mg since the past few days and that helps. I have reviewed [place] controlled substance database.  Patient to continue psychotherapy sessions with Ms.Hussami.  Follow-up in clinic in 8 weeks or sooner if needed.  I have spent atleast 20 minutes  face to face with patient today. More than 50 % of the time was spent for preparing to see the patient ( e.g., review of test, records ),  ordering medications and test ,psychoeducation and supportive psychotherapy and care coordination,as well as documenting clinical information in electronic health record. This note was generated in part or whole with voice recognition software. Voice recognition is usually  quite accurate but there are transcription errors that can and very often do occur. I apologize for any typographical errors that were not detected and corrected.      20, MD 12/30/2019, 12:50 PM

## 2019-12-31 ENCOUNTER — Telehealth: Payer: Self-pay

## 2019-12-31 NOTE — Telephone Encounter (Signed)
received request for a prior authorization on this patient.

## 2020-01-02 NOTE — Telephone Encounter (Signed)
received approved notice for the zolpidem tartrate 10mg 

## 2020-01-06 ENCOUNTER — Other Ambulatory Visit: Payer: Self-pay | Admitting: Psychiatry

## 2020-01-06 ENCOUNTER — Other Ambulatory Visit: Payer: Self-pay

## 2020-01-06 ENCOUNTER — Ambulatory Visit (INDEPENDENT_AMBULATORY_CARE_PROVIDER_SITE_OTHER): Payer: PRIVATE HEALTH INSURANCE | Admitting: Licensed Clinical Social Worker

## 2020-01-06 DIAGNOSIS — F3172 Bipolar disorder, in full remission, most recent episode hypomanic: Secondary | ICD-10-CM | POA: Diagnosis not present

## 2020-01-06 DIAGNOSIS — F5105 Insomnia due to other mental disorder: Secondary | ICD-10-CM

## 2020-01-06 NOTE — Progress Notes (Signed)
Virtual Visit via Video Note  I connected with [name] on 01/06/20 at  2:30 PM EDT by a video enabled telemedicine application and verified that I am speaking with the correct person using two identifiers.  Location: Patient: home Provider: [facility]   I discussed the limitations of evaluation and management by telemedicine and the availability of in person appointments. The patient expressed understanding and agreed to proceed.  The patient was advised to call back or seek an in-person evaluation if the symptoms worsen or if the condition fails to improve as anticipated.  I provided 60 minutes of non-face-to-face time during this encounter.   Keyshia Orwick R Nashonda Limberg, LCSW    THERAPIST PROGRESS NOTE  Session Time: 2:30-3:30p  Participation Level: Active  Behavioral Response: Neat and Well GroomedAlertAnxious and Depressed  Type of Therapy: Individual Therapy  Treatment Goals addressed: Anxiety and Coping  Interventions: CBT, DBT, Supportive and Reframing  Summary: OWYNN MOSQUEDA is a 39 y.o. male who presents with improving symptoms related to his bipolar diagnosis. Pt reports that his mood has been stable, he is managing anxiety/stress well, and is compliant with medication. Pt reports that he recently started taking Ambien, which is helping pt get better quality and quantity of sleep.  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 Pt feels that he is putting forth so much passion and energy that he feels emotionally and physically exhausted.   Discussed life balance, distress tolerance, stress management.  Pt is doing a great job of reflection, recognizing triggers, recognizing behavior patterns,  and implementing interventions from counseling sessions in the past to manage and modify behavior.   Encouraged pt to continue with self care, life balance, and overall stress/anxiety management.   Suicidal/Homicidal: No  SI, HI, or AVH reported at time of session.  Therapist Response: [name] continues to make good progress with self awareness, self reflection, and a gwoeing capacity for greater pleasure in relationships and work. Current treatment continues to show good evolution and development.  Plan: Return again in 4 weeks.Ongoing treatment plan to include mood/emotion regulation, distress tolerance, reinforcing healthy coping mechanisms, stress management, life balance, and continuing focus on self care.  Diagnosis: Axis I: Bipolar, Manic    Axis II: No diagnosis    Ernest Haber Brisha Mccabe, LCSW 01/06/2020

## 2020-01-09 ENCOUNTER — Other Ambulatory Visit: Payer: Self-pay | Admitting: Child and Adolescent Psychiatry

## 2020-01-09 DIAGNOSIS — F5105 Insomnia due to other mental disorder: Secondary | ICD-10-CM

## 2020-01-27 ENCOUNTER — Telehealth: Payer: Self-pay

## 2020-01-27 NOTE — Telephone Encounter (Signed)
Returned call to patient.  Left voicemail message to call me back.

## 2020-01-27 NOTE — Telephone Encounter (Signed)
pt called left message that he is having issues with his medications and he needs to speak with you abut makin changes.

## 2020-01-29 ENCOUNTER — Telehealth: Payer: Self-pay

## 2020-01-29 ENCOUNTER — Other Ambulatory Visit: Payer: Self-pay

## 2020-01-29 ENCOUNTER — Ambulatory Visit (INDEPENDENT_AMBULATORY_CARE_PROVIDER_SITE_OTHER): Payer: PRIVATE HEALTH INSURANCE

## 2020-01-29 DIAGNOSIS — F3181 Bipolar II disorder: Secondary | ICD-10-CM

## 2020-01-29 DIAGNOSIS — F5105 Insomnia due to other mental disorder: Secondary | ICD-10-CM

## 2020-01-29 DIAGNOSIS — Z23 Encounter for immunization: Secondary | ICD-10-CM

## 2020-01-29 MED ORDER — LAMOTRIGINE 200 MG PO TABS: 200.0000 mg | ORAL_TABLET | Freq: Every day | ORAL | 0 refills | Status: DC

## 2020-01-29 NOTE — Telephone Encounter (Signed)
pt left message that he was returning your call 

## 2020-01-29 NOTE — Telephone Encounter (Signed)
Returned call to patient. He reports he has this anxiety that is not going away with just the Lamictal. He felt better on the Seroquel and Lamictal combination. He however is worried about going back on Seroquel due to weight gain side effects. He does not believe the Klonopin is helpful since it helps for a while and then he is anxious again. He hence does not want to go back on it.  Discussed increasing Lamictal. He is agreeable.  Increase Lamictal to 200 mg p.o. daily  If this does not help he will reach out.

## 2020-02-05 ENCOUNTER — Other Ambulatory Visit: Payer: Self-pay

## 2020-02-05 ENCOUNTER — Ambulatory Visit (INDEPENDENT_AMBULATORY_CARE_PROVIDER_SITE_OTHER): Payer: PRIVATE HEALTH INSURANCE | Admitting: Licensed Clinical Social Worker

## 2020-02-05 DIAGNOSIS — F3181 Bipolar II disorder: Secondary | ICD-10-CM

## 2020-02-05 NOTE — Progress Notes (Signed)
Virtual Visit via Video Note  I connected with Darrell Kennedy on 02/05/20 at  1:30 PM EDT by a video enabled telemedicine application and verified that I am speaking with the correct person using two identifiers.  Location: Patient: home Provider: remote office [place], [place])   I discussed the limitations of evaluation and management by telemedicine and the availability of in person appointments. The patient expressed understanding and agreed to proceed.   The patient was advised to call back or seek an in-person evaluation if the symptoms worsen or if the condition fails to improve as anticipated.  I provided 60 minutes of non-face-to-face time during this encounter.   Darrell Pellegrini R Briteny Fulghum, LCSW    THERAPIST PROGRESS NOTE  Session Time: 1:30-2:30p  Participation Level: Active  Behavioral Response: NeatAlertAnxious  Type of Therapy: Individual Therapy  Treatment Goals addressed: Anxiety  Interventions: CBT  Summary: Darrell Kennedy is a 39 y.o. male who presents with improving symptoms related to depression and anxiety. Pt reports that decision were made with psychiatrist about making medication changes--currently in the process of discontinuing one medication and increasing dosage of another. Pt reports mild mood fluctuations as a result of the med change. Pt feels he is managing anxiety/stress symptoms well.  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   Allowed pt to reflect on lifelong personal growth.  Encouraged pt to continue focusing on self care, being physically active, engaging socially and working on life balance.   Suicidal/Homicidal: No  Therapist Response: Darrell Kennedy continues to make good progress with self understanding, insight, and awareness. Pt demonstrates a capacity  for greater enjoyment of leisure time and creative projects.   Plan: Return again in 4 weeks.The ongoing treatment plan includes maintaining current levels of progress and continuing to build skills to manage mood, improve stress/anxiety management, emotion regulation, distress tolerance, and behavior modification.  Diagnosis: Axis I: Bipolar, Depressed    Axis II: No diagnosis    Ernest Haber Haik Mahoney, LCSW 02/05/2020

## 2020-02-19 ENCOUNTER — Encounter: Payer: Self-pay | Admitting: Psychiatry

## 2020-02-19 ENCOUNTER — Other Ambulatory Visit: Payer: Self-pay

## 2020-02-19 ENCOUNTER — Telehealth (INDEPENDENT_AMBULATORY_CARE_PROVIDER_SITE_OTHER): Payer: PRIVATE HEALTH INSURANCE | Admitting: Psychiatry

## 2020-02-19 ENCOUNTER — Telehealth: Payer: Self-pay | Admitting: Psychiatry

## 2020-02-19 DIAGNOSIS — F5105 Insomnia due to other mental disorder: Secondary | ICD-10-CM | POA: Diagnosis not present

## 2020-02-19 DIAGNOSIS — F3181 Bipolar II disorder: Secondary | ICD-10-CM

## 2020-02-19 MED ORDER — LAMOTRIGINE 25 MG PO TABS: 25.0000 mg | ORAL_TABLET | Freq: Two times a day (BID) | ORAL | 0 refills | Status: DC

## 2020-02-19 MED ORDER — ESCITALOPRAM OXALATE 10 MG PO TABS: 5.0000 mg | ORAL_TABLET | Freq: Every day | ORAL | 0 refills | Status: DC

## 2020-02-19 NOTE — Progress Notes (Signed)
Virtual Visit via Video Note  I connected with Darrell Kennedy on 02/19/20 at  3:20 PM EDT by a video enabled telemedicine application and verified that I am speaking with the correct person using two identifiers.  Location Provider Location : [facility] Patient Location : Home  Participants: Patient , Provider    I discussed the limitations of evaluation and management by telemedicine and the availability of in person appointments. The patient expressed understanding and agreed to proceed.    I discussed the assessment and treatment plan with the patient. The patient was provided an opportunity to ask questions and all were answered. The patient agreed with the plan and demonstrated an understanding of the instructions.   The patient was advised to call back or seek an in-person evaluation if the symptoms worsen or if the condition fails to improve as anticipated.   BH MD OP Progress Note  02/19/2020 3:47 PM Darrell Kennedy  MRN:  [id number]  Chief Complaint:  Chief Complaint    Follow-up     HPI: Darrell Kennedy is a 39 year old Caucasian male, lives in [place], has a history of bipolar disorder, hyperlipidemia, HIV positive was evaluated by telemedicine today.  Patient today reports he is currently struggling with depressive symptoms.  He reports he believes the Lamictal higher dosage is beneficial however he still has days when he feels down.  He feels the Lamictal dosage is not enough and may need a higher dosage.  He also reports he has a lot of anxiety going on.  He reports anything and everything can make him anxious and he also has physical symptoms of anxiety like racing heart rate and so on.  Patient reports sleep is good.  He reports the Seroquel did help him with these symptoms however due to the weight gain side effects he does not want to go back on it.  He reports therapy sessions are beneficial and he is motivated to keep them.  He denies any suicidality,  homicidality or perceptual disturbances.  Patient denies any other concerns today.    Visit Diagnosis:    ICD-10-CM   1. Bipolar 2 disorder (HCC)  F31.81 lamoTRIgine (LAMICTAL) 25 MG tablet    escitalopram (LEXAPRO) 10 MG tablet   depressed with anxious distress , mild  2. Insomnia due to mental condition  F51.05     Past Psychiatric History: I have reviewed past psychiatric history from my progress note on 02/06/2019.  Past trials of BuSpar, Seroquel  Past Medical History:  Past Medical History:  Diagnosis Date  . Heart murmur   . HIV (human immunodeficiency virus infection) (HCC)   . HLD (hyperlipidemia)   . Stroke [facility])     Past Surgical History:  Procedure Laterality Date  . TEE WITHOUT CARDIOVERSION N/A 03/05/2019   Procedure: TRANSESOPHAGEAL ECHOCARDIOGRAM (TEE);  Surgeon: Darrell Blinks, MD;  Location: [facility] ORS;  Service: Cardiovascular;  Laterality: N/A;    Family Psychiatric History: I have reviewed family psychiatric history from my progress note on 02/06/2019  Family History:  Family History  Problem Relation Age of Onset  . Hypertension Father   . Diabetes Father   . Kidney disease Father   . Depression Father   . Hyperlipidemia Father   . Alcohol abuse Brother   . Heart disease Paternal Grandmother   . Drug abuse Cousin     Social History: I have reviewed social history from my progress note on 02/06/2019 Social History   Socioeconomic History  . Marital  status: Single    Spouse name: Not on file  . Number of children: 0  . Years of education: Not on file  . Highest education level: Master's degree (e.g., MA, MS, MEng, MEd, MSW, MBA)  Occupational History  . Occupation: Consulting civil engineer  Tobacco Use  . Smoking status: Never Smoker  . Smokeless tobacco: Never Used  Vaping Use  . Vaping Use: Never used  Substance and Sexual Activity  . Alcohol use: Yes    Comment: social maybe once a week  . Drug use: Never  . Sexual activity: Yes    Birth  control/protection: Condom  Other Topics Concern  . Not on file  Social History Narrative  . Not on file   Social Determinants of Health   Financial Resource Strain:   . Difficulty of Paying Living Expenses: Not on file  Food Insecurity:   . Worried About Programme researcher, broadcasting/film/video in the Last Year: Not on file  . Ran Out of Food in the Last Year: Not on file  Transportation Needs:   . Lack of Transportation (Medical): Not on file  . Lack of Transportation (Non-Medical): Not on file  Physical Activity:   . Days of Exercise per Week: Not on file  . Minutes of Exercise per Session: Not on file  Stress:   . Feeling of Stress : Not on file  Social Connections:   . Frequency of Communication with Friends and Family: Not on file  . Frequency of Social Gatherings with Friends and Family: Not on file  . Attends Religious Services: Not on file  . Active Member of Clubs or Organizations: Not on file  . Attends Banker Meetings: Not on file  . Marital Status: Not on file    Allergies:  Allergies  Allergen Reactions  . Amoxicillin Rash    Metabolic Disorder Labs: Lab Results  Component Value Date   HGBA1C 5.6 02/12/2019   MPG 114.02 02/12/2019   No results found for: PROLACTIN Lab Results  Component Value Date   CHOL 161 09/23/2019   TRIG 70 09/23/2019   HDL 58 09/23/2019   CHOLHDL 2.8 09/23/2019   VLDL 14 09/23/2019   LDLCALC 89 09/23/2019   LDLCALC 78 02/12/2019   Lab Results  Component Value Date   TSH 1.78 02/12/2019    Therapeutic Level Labs: No results found for: LITHIUM No results found for: VALPROATE No components found for:  CBMZ  Current Medications: Current Outpatient Medications  Medication Sig Dispense Refill  . aspirin (ASPIRIN CHILDRENS) 81 MG chewable tablet Chew 1 tablet (81 mg total) by mouth daily. 120 tablet 0  . atorvastatin (LIPITOR) 20 MG tablet Take 1 tablet (20 mg total) by mouth daily. 90 tablet 3  .  bictegravir-emtricitabine-tenofovir AF (BIKTARVY) 50-200-25 MG TABS tablet Take 1 tablet by mouth daily. 30 tablet 4  . clindamycin (CLEOCIN) 150 MG capsule Take 150 mg by mouth 4 (four) times daily.    Marland Kitchen escitalopram (LEXAPRO) 10 MG tablet Take 0.5-1 tablets (5-10 mg total) by mouth daily. Start taking half tablet for 2 week and then increase to 1 tablet daily 90 tablet 0  . lamoTRIgine (LAMICTAL) 200 MG tablet Take 1 tablet (200 mg total) by mouth daily. 90 tablet 0  . lamoTRIgine (LAMICTAL) 25 MG tablet Take 1 tablet (25 mg total) by mouth 2 (two) times daily. Start taking with 200 mg daily 180 tablet 0  . Levomefolate Glucosamine (METHYLFOLATE PO) Take 1 mg by mouth daily.    Marland Kitchen  Multiple Vitamin (MULTIVITAMIN WITH MINERALS) TABS tablet Take 1 tablet by mouth daily.    Marland Kitchen OVER THE COUNTER MEDICATION Levium anxiety supplement    . zolpidem (AMBIEN) 10 MG tablet Take 1 tablet (10 mg total) by mouth at bedtime as needed for sleep. 90 tablet 0   No current facility-administered medications for this visit.     Musculoskeletal: Strength & Muscle Tone: UTA Gait & Station: normal Patient leans: N/A  Psychiatric Specialty Exam: Review of Systems  Psychiatric/Behavioral: Positive for dysphoric mood. The patient is nervous/anxious.   All other systems reviewed and are negative.   There were no vitals taken for this visit.There is no height or weight on file to calculate BMI.  General Appearance: Casual  Eye Contact:  Fair  Speech:  Clear and Coherent  Volume:  Normal  Mood:  Anxious and Depressed  Affect:  Congruent  Thought Process:  Goal Directed and Descriptions of Associations: Intact  Orientation:  Full (Time, Place, and Person)  Thought Content: Logical   Suicidal Thoughts:  No  Homicidal Thoughts:  No  Memory:  Immediate;   Fair Recent;   Fair Remote;   Fair  Judgement:  Fair  Insight:  Fair  Psychomotor Activity:  Normal  Concentration:  Concentration: Fair and Attention Span:  Fair  Recall:  [employer] of Knowledge: Fair  Language: Fair  Akathisia:  No  Handed:  Right  AIMS (if indicated): UTA  Assets:  Communication Skills Desire for Improvement Housing Social Support  ADL's:  Intact  Cognition: WNL  Sleep:  Fair   Screenings: AUDIT     Office Visit from 10/07/2018 in [facility]  Alcohol Use Disorder Identification Test Final Score (AUDIT) 3    PHQ2-9     Office Visit from 03/27/2019 in [facility] Office Visit from 02/20/2019 in [facility] Office Visit from 11/25/2018 in [facility] Office Visit from 10/07/2018 in [facility]  PHQ-2 Total Score 0 2 2 2   PHQ-9 Total Score 2 10 11 2        Assessment and Plan: Darrell Kennedy is a 39 year old Caucasian male, student, employed, lives in [place], has a history of bipolar disorder, hyperlipidemia, HIV positive, anticardiolipin antibodies was evaluated by telemedicine today.  Patient is biologically predisposed given his family history of substance abuse and health problems.  Patient is currently struggling with mood symptoms and will benefit from medication readjustment.  Plan as noted below.  Plan Bipolar disorder, depressed with anxious distress, mild-unstable Increase Lamictal to 250 mg p.o. daily. Start Lexapro 5 mg p.o. daily for 2 weeks then increase to 10 mg p.o. daily after that.  Insomnia-stable Ambien 10 mg p.o. nightly.  Follow-up in clinic in 3 to 4 weeks or sooner if needed.  I have spent atleast 20 minutes face to face by video with patient today. More than 50 % of the time was spent for preparing to see the patient ( e.g., review of test, records ), ordering medications and test ,psychoeducation and supportive psychotherapy and care coordination,as well as documenting clinical information in electronic health record. This note was generated in part or whole with voice recognition  software. Voice recognition is usually quite accurate but there are transcription errors that can and very often do occur. I apologize for any typographical errors that were not detected and corrected.     20, MD 02/20/2020, 7:54 AM

## 2020-02-19 NOTE — Patient Instructions (Signed)
Escitalopram tablets What is this medicine? ESCITALOPRAM (es sye TAL oh pram) is used to treat depression and certain types of anxiety. This medicine may be used for other purposes; ask your health care provider or pharmacist if you have questions. COMMON BRAND NAME(S): Lexapro What should I tell my health care provider before I take this medicine? They need to know if you have any of these conditions:  bipolar disorder or a family history of bipolar disorder  diabetes  glaucoma  heart disease  kidney or liver disease  receiving electroconvulsive therapy  seizures (convulsions)  suicidal thoughts, plans, or attempt by you or a family member  an unusual or allergic reaction to escitalopram, the related drug citalopram, other medicines, foods, dyes, or preservatives  pregnant or trying to become pregnant  breast-feeding How should I use this medicine? Take this medicine by mouth with a glass of water. Follow the directions on the prescription label. You can take it with or without food. If it upsets your stomach, take it with food. Take your medicine at regular intervals. Do not take it more often than directed. Do not stop taking this medicine suddenly except upon the advice of your doctor. Stopping this medicine too quickly may cause serious side effects or your condition may worsen. A special MedGuide will be given to you by the pharmacist with each prescription and refill. Be sure to read this information carefully each time. Talk to your pediatrician regarding the use of this medicine in children. Special care may be needed. Overdosage: If you think you have taken too much of this medicine contact a poison control center or emergency room at once. NOTE: This medicine is only for you. Do not share this medicine with others. What if I miss a dose? If you miss a dose, take it as soon as you can. If it is almost time for your next dose, take only that dose. Do not take double or  extra doses. What may interact with this medicine? Do not take this medicine with any of the following medications:  certain medicines for fungal infections like fluconazole, itraconazole, ketoconazole, posaconazole, voriconazole  cisapride  citalopram  dronedarone  linezolid  MAOIs like Carbex, Eldepryl, Marplan, Nardil, and Parnate  methylene blue (injected into a vein)  pimozide  thioridazine This medicine may also interact with the following medications:  alcohol  amphetamines  aspirin and aspirin-like medicines  carbamazepine  certain medicines for depression, anxiety, or psychotic disturbances  certain medicines for migraine headache like almotriptan, eletriptan, frovatriptan, naratriptan, rizatriptan, sumatriptan, zolmitriptan  certain medicines for sleep  certain medicines that treat or prevent blood clots like warfarin, enoxaparin, dalteparin  cimetidine  diuretics  dofetilide  fentanyl  furazolidone  isoniazid  lithium  metoprolol  NSAIDs, medicines for pain and inflammation, like ibuprofen or naproxen  other medicines that prolong the QT interval (cause an abnormal heart rhythm)  procarbazine  rasagiline  supplements like St. John's wort, kava kava, valerian  tramadol  tryptophan  ziprasidone This list may not describe all possible interactions. Give your health care provider a list of all the medicines, herbs, non-prescription drugs, or dietary supplements you use. Also tell them if you smoke, drink alcohol, or use illegal drugs. Some items may interact with your medicine. What should I watch for while using this medicine? Tell your doctor if your symptoms do not get better or if they get worse. Visit your doctor or health care professional for regular checks on your progress. Because it may   take several weeks to see the full effects of this medicine, it is important to continue your treatment as prescribed by your doctor. Patients  and their families should watch out for new or worsening thoughts of suicide or depression. Also watch out for sudden changes in feelings such as feeling anxious, agitated, panicky, irritable, hostile, aggressive, impulsive, severely restless, overly excited and hyperactive, or not being able to sleep. If this happens, especially at the beginning of treatment or after a change in dose, call your health care professional. You may get drowsy or dizzy. Do not drive, use machinery, or do anything that needs mental alertness until you know how this medicine affects you. Do not stand or sit up quickly, especially if you are an older patient. This reduces the risk of dizzy or fainting spells. Alcohol may interfere with the effect of this medicine. Avoid alcoholic drinks. Your mouth may get dry. Chewing sugarless gum or sucking hard candy, and drinking plenty of water may help. Contact your doctor if the problem does not go away or is severe. What side effects may I notice from receiving this medicine? Side effects that you should report to your doctor or health care professional as soon as possible:  allergic reactions like skin rash, itching or hives, swelling of the face, lips, or tongue  anxious  black, tarry stools  changes in vision  confusion  elevated mood, decreased need for sleep, racing thoughts, impulsive behavior  eye pain  fast, irregular heartbeat  feeling faint or lightheaded, falls  feeling agitated, angry, or irritable  hallucination, loss of contact with reality  loss of balance or coordination  loss of memory  painful or prolonged erections  restlessness, pacing, inability to keep still  seizures  stiff muscles  suicidal thoughts or other mood changes  trouble sleeping  unusual bleeding or bruising  unusually weak or tired  vomiting Side effects that usually do not require medical attention (report to your doctor or health care professional if they  continue or are bothersome):  changes in appetite  change in sex drive or performance  headache  increased sweating  indigestion, nausea  tremors This list may not describe all possible side effects. Call your doctor for medical advice about side effects. You may report side effects to FDA at 1-800-FDA-1088. Where should I keep my medicine? Keep out of reach of children. Store at room temperature between 15 and 30 degrees C (59 and 86 degrees F). Throw away any unused medicine after the expiration date. NOTE: This sheet is a summary. It may not cover all possible information. If you have questions about this medicine, talk to your doctor, pharmacist, or health care provider.  2020 Elsevier/Gold Standard (2018-03-25 11:21:44)  

## 2020-03-08 ENCOUNTER — Ambulatory Visit (INDEPENDENT_AMBULATORY_CARE_PROVIDER_SITE_OTHER): Payer: PRIVATE HEALTH INSURANCE | Admitting: Licensed Clinical Social Worker

## 2020-03-08 ENCOUNTER — Other Ambulatory Visit: Payer: Self-pay

## 2020-03-08 DIAGNOSIS — F3181 Bipolar II disorder: Secondary | ICD-10-CM | POA: Diagnosis not present

## 2020-03-08 NOTE — Progress Notes (Addendum)
Virtual Visit via Video Note  I connected with [name] on 03/08/20 at  1:30 PM EST by a video enabled telemedicine application and verified that I am speaking with the correct person using two identifiers.  Location: Patient: home Provider: [facility]   Session Participants: Patient--Wolf "Darrell Kennedy" Klasen Counselor--Daizy Outen, MSW, LCSW  I discussed the limitations of evaluation and management by telemedicine and the availability of in person appointments. The patient expressed understanding and agreed to proceed.   The patient was advised to call back or seek an in-person evaluation if the symptoms worsen or if the condition fails to improve as anticipated.  I provided 60 minutes of non-face-to-face time during this encounter.   Adham  R Santresa Levett, LCSW    THERAPIST PROGRESS NOTE  Session Time: 2:00-3:00p  Participation Level: Active  Behavioral Response: Neat and Well GroomedAlertgenerally pleasant and positive  Type of Therapy: Individual Therapy  Treatment Goals addressed: Coping  Interventions: Supportive  Summary: [name] is a 39 y.o. male who presents with improving symptoms related to bipolar disorder diagnosis. Pt reports that mood is stable and that he feels he is managing stress and anxiety well.    Allowed pt to explore thoughts and feelings associated with recent life events and pt shared that he is very excited about a newly accepted job offer at [employer]. 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   Allowed pt to reflect back on the past few months/sessions at situations that pt has been worried about, and pts short term and long term goals. Allowed pt to brainstorm newer goals and share thoughts about overall progress. Pt very pleased on the path that he is on right now.  Encouraged continuing self care and focus on overall emotional wellness.    Suicidal/Homicidal: No  SI, HI, or AVH reported at time of session.  Therapist Response:  Darrell Kennedy continues to make good progress with overall self care and life management. Darrell Kennedy continues to make steady gains in self esteem and confidence. Treatment showing good evolution and development  Plan: Return again in 3 weeks.The ongoing treatment plan includes maintaining current levels of progress and continuing to build skills to manage mood, improve stress/anxiety management, emotion regulation, distress tolerance, and behavior modification.  Diagnosis: Axis I: Bipolar, Depressed    Axis II: No diagnosis    Ernest Haber Cherril Hett, LCSW 03/08/2020

## 2020-03-16 ENCOUNTER — Encounter: Payer: Self-pay | Admitting: Psychiatry

## 2020-03-16 ENCOUNTER — Telehealth (INDEPENDENT_AMBULATORY_CARE_PROVIDER_SITE_OTHER): Payer: PRIVATE HEALTH INSURANCE | Admitting: Psychiatry

## 2020-03-16 ENCOUNTER — Other Ambulatory Visit: Payer: Self-pay

## 2020-03-16 DIAGNOSIS — F3176 Bipolar disorder, in full remission, most recent episode depressed: Secondary | ICD-10-CM | POA: Diagnosis not present

## 2020-03-16 DIAGNOSIS — F5105 Insomnia due to other mental disorder: Secondary | ICD-10-CM | POA: Diagnosis not present

## 2020-03-16 MED ORDER — ZOLPIDEM TARTRATE 10 MG PO TABS: 10.0000 mg | ORAL_TABLET | Freq: Every evening | ORAL | 0 refills | Status: DC | PRN

## 2020-03-16 NOTE — Progress Notes (Signed)
Virtual Visit via Video Note  I connected with Darrell Kennedy on 03/16/20 at 11:00 AM EST by a video enabled telemedicine application and verified that I am speaking with the correct person using two identifiers.  Location Provider Location : [facility] Patient Location : Home  Participants: Patient , Provider    I discussed the limitations of evaluation and management by telemedicine and the availability of in person appointments. The patient expressed understanding and agreed to proceed   I discussed the assessment and treatment plan with the patient. The patient was provided an opportunity to ask questions and all were answered. The patient agreed with the plan and demonstrated an understanding of the instructions.   The patient was advised to call back or seek an in-person evaluation if the symptoms worsen or if the condition fails to improve as anticipated.   BH MD OP Progress Note  03/16/2020 12:34 PM Darrell Kennedy  MRN:  [id number]  Chief Complaint:  Chief Complaint    Follow-up     HPI: Darrell Kennedy is a 39 year old Caucasian male, lives in [place], has a history of bipolar disorder, insomnia, hyperlipidemia, HIV positive was evaluated by telemedicine today.  Patient today reports he is currently making progress on the current combination of medication.  The higher dosage of Lamictal as well as being on Lexapro has definitely made a change.  He reports that his physical symptoms of anxiety have improved and he does not have any mood swings or anxiety attacks.  Patient reports sleep as good.  He continues to take Ambien.  He denies any suicidality, homicidality or perceptual disturbances.  He reports he had a good Thanksgiving holiday with his family.  He has upcoming procedure for an impacted wisdom tooth.  He denies any other concerns today.  Visit Diagnosis:    ICD-10-CM   1. Bipolar disorder, in full remission, most recent episode depressed (HCC)  F31.76    2. Insomnia due to mental condition  F51.05 zolpidem (AMBIEN) 10 MG tablet    Past Psychiatric History: I have reviewed past psychiatric history from my progress note on 02/06/2019.  Past trials of BuSpar, Seroquel  Past Medical History:  Past Medical History:  Diagnosis Date  . Heart murmur   . HIV (human immunodeficiency virus infection) (HCC)   . HLD (hyperlipidemia)   . Stroke [facility])     Past Surgical History:  Procedure Laterality Date  . TEE WITHOUT CARDIOVERSION N/A 03/05/2019   Procedure: TRANSESOPHAGEAL ECHOCARDIOGRAM (TEE);  Surgeon: Lamar Blinks, MD;  Location: [facility] ORS;  Service: Cardiovascular;  Laterality: N/A;    Family Psychiatric History: I have reviewed family psychiatric history from my progress note on 02/06/2019  Family History:  Family History  Problem Relation Age of Onset  . Hypertension Father   . Diabetes Father   . Kidney disease Father   . Depression Father   . Hyperlipidemia Father   . Alcohol abuse Brother   . Heart disease Paternal Grandmother   . Drug abuse Cousin     Social History: Reviewed social history from my progress note on 02/06/2019 Social History   Socioeconomic History  . Marital status: Single    Spouse name: Not on file  . Number of children: 0  . Years of education: Not on file  . Highest education level: Master's degree (e.g., MA, MS, MEng, MEd, MSW, MBA)  Occupational History  . Occupation: Consulting civil engineer  Tobacco Use  . Smoking status: Never Smoker  . Smokeless tobacco:  Never Used  Vaping Use  . Vaping Use: Never used  Substance and Sexual Activity  . Alcohol use: Yes    Comment: social maybe once a week  . Drug use: Never  . Sexual activity: Yes    Birth control/protection: Condom  Other Topics Concern  . Not on file  Social History Narrative  . Not on file   Social Determinants of Health   Financial Resource Strain:   . Difficulty of Paying Living Expenses: Not on file  Food Insecurity:   . Worried  About Programme researcher, broadcasting/film/video in the Last Year: Not on file  . Ran Out of Food in the Last Year: Not on file  Transportation Needs:   . Lack of Transportation (Medical): Not on file  . Lack of Transportation (Non-Medical): Not on file  Physical Activity:   . Days of Exercise per Week: Not on file  . Minutes of Exercise per Session: Not on file  Stress:   . Feeling of Stress : Not on file  Social Connections:   . Frequency of Communication with Friends and Family: Not on file  . Frequency of Social Gatherings with Friends and Family: Not on file  . Attends Religious Services: Not on file  . Active Member of Clubs or Organizations: Not on file  . Attends Banker Meetings: Not on file  . Marital Status: Not on file    Allergies:  Allergies  Allergen Reactions  . Amoxicillin Rash    Metabolic Disorder Labs: Lab Results  Component Value Date   HGBA1C 5.6 02/12/2019   MPG 114.02 02/12/2019   No results found for: PROLACTIN Lab Results  Component Value Date   CHOL 161 09/23/2019   TRIG 70 09/23/2019   HDL 58 09/23/2019   CHOLHDL 2.8 09/23/2019   VLDL 14 09/23/2019   LDLCALC 89 09/23/2019   LDLCALC 78 02/12/2019   Lab Results  Component Value Date   TSH 1.78 02/12/2019    Therapeutic Level Labs: No results found for: LITHIUM No results found for: VALPROATE No components found for:  CBMZ  Current Medications: Current Outpatient Medications  Medication Sig Dispense Refill  . aspirin (ASPIRIN CHILDRENS) 81 MG chewable tablet Chew 1 tablet (81 mg total) by mouth daily. 120 tablet 0  . atorvastatin (LIPITOR) 20 MG tablet Take 1 tablet (20 mg total) by mouth daily. 90 tablet 3  . bictegravir-emtricitabine-tenofovir AF (BIKTARVY) 50-200-25 MG TABS tablet Take 1 tablet by mouth daily. 30 tablet 4  . clindamycin (CLEOCIN) 150 MG capsule Take 150 mg by mouth 4 (four) times daily.    . clonazePAM (KLONOPIN) 0.5 MG tablet Take by mouth.    . escitalopram (LEXAPRO) 10  MG tablet Take 0.5-1 tablets (5-10 mg total) by mouth daily. Start taking half tablet for 2 week and then increase to 1 tablet daily 90 tablet 0  . ketoconazole (NIZORAL) 2 % shampoo Apply topically.    . lamoTRIgine (LAMICTAL) 200 MG tablet Take 1 tablet (200 mg total) by mouth daily. 90 tablet 0  . lamoTRIgine (LAMICTAL) 25 MG tablet Take 1 tablet (25 mg total) by mouth 2 (two) times daily. Start taking with 200 mg daily 180 tablet 0  . Levomefolate Glucosamine (METHYLFOLATE PO) Take 1 mg by mouth daily.    . Multiple Vitamin (MULTIVITAMIN WITH MINERALS) TABS tablet Take 1 tablet by mouth daily.    Marland Kitchen OVER THE COUNTER MEDICATION Levium anxiety supplement    . zolpidem (AMBIEN) 10 MG tablet  Take 1 tablet (10 mg total) by mouth at bedtime as needed for sleep. 90 tablet 0   No current facility-administered medications for this visit.     Musculoskeletal: Strength & Muscle Tone: UTA Gait & Station: UTA Patient leans: N/A  Psychiatric Specialty Exam: Review of Systems  Psychiatric/Behavioral: Negative for agitation, behavioral problems, confusion, decreased concentration, dysphoric mood, hallucinations, self-injury, sleep disturbance and suicidal ideas. The patient is not nervous/anxious and is not hyperactive.   All other systems reviewed and are negative.   There were no vitals taken for this visit.There is no height or weight on file to calculate BMI.  General Appearance: Casual  Eye Contact:  Fair  Speech:  Normal Rate  Volume:  Normal  Mood:  Euthymic  Affect:  Congruent  Thought Process:  Goal Directed and Descriptions of Associations: Intact  Orientation:  Full (Time, Place, and Person)  Thought Content: Logical   Suicidal Thoughts:  No  Homicidal Thoughts:  No  Memory:  Immediate;   Fair Recent;   Fair Remote;   Fair  Judgement:  Fair  Insight:  Fair  Psychomotor Activity:  Normal  Concentration:  Concentration: Fair and Attention Span: Fair  Recall:  [employer] of  Knowledge: Fair  Language: Fair  Akathisia:  No  Handed:  Right  AIMS (if indicated): UTA  Assets:  Communication Skills Desire for Improvement Housing Social Support  ADL's:  Intact  Cognition: WNL  Sleep:  Fair   Screenings: AUDIT     Office Visit from 10/07/2018 in [facility]  Alcohol Use Disorder Identification Test Final Score (AUDIT) 3    PHQ2-9     Office Visit from 03/27/2019 in [facility] Office Visit from 02/20/2019 in [facility] Office Visit from 11/25/2018 in [facility] Office Visit from 10/07/2018 in [facility]  PHQ-2 Total Score 0 2 2 2   PHQ-9 Total Score 2 10 11 2        Assessment and Plan: Darrell Kennedy is a 39 year old Caucasian male, employed, lives in [place], has a history of bipolar disorder, hyperlipidemia, HIV positive, was evaluated by telemedicine today.  Patient is biologically predisposed given his family history of substance abuse and health problems.  Patient is currently making progress on the current medication regimen.  Plan as noted below.  Plan Bipolar disorder in remission Lamictal 250 mg p.o. daily Lexapro 10 mg p.o. daily  Insomnia-stable Ambien 10 mg p.o. nightly  Follow-up in clinic in 1 month or sooner if needed.  I have spent atleast 20 minutes face to face by video with patient today. More than 50 % of the time was spent for preparing to see the patient ( e.g., review of test, records ), ordering medications and test ,psychoeducation and supportive psychotherapy and care coordination,as well as documenting clinical information in electronic health record. This note was generated in part or whole with voice recognition software. Voice recognition is usually quite accurate but there are transcription errors that can and very often do occur. I apologize for any typographical errors that were not detected and  corrected.        20, MD 03/16/2020, 12:34 PM

## 2020-03-18 ENCOUNTER — Other Ambulatory Visit
Admission: RE | Admit: 2020-03-18 | Discharge: 2020-03-18 | Disposition: A | Discharge status: Home / Self Care | Payer: PRIVATE HEALTH INSURANCE | Source: Ambulatory Visit | Attending: Infectious Diseases | Admitting: Infectious Diseases

## 2020-03-18 ENCOUNTER — Ambulatory Visit: Payer: PRIVATE HEALTH INSURANCE | Attending: Infectious Diseases | Admitting: Infectious Diseases

## 2020-03-18 ENCOUNTER — Encounter: Payer: Self-pay | Admitting: Infectious Diseases

## 2020-03-18 ENCOUNTER — Other Ambulatory Visit: Payer: Self-pay

## 2020-03-18 VITALS — BP 127/62 | HR 86 | Resp 16 | Ht 68.0 in | Wt 174.0 lb

## 2020-03-18 DIAGNOSIS — K0889 Other specified disorders of teeth and supporting structures: Secondary | ICD-10-CM | POA: Insufficient documentation

## 2020-03-18 DIAGNOSIS — D6861 Antiphospholipid syndrome: Secondary | ICD-10-CM | POA: Insufficient documentation

## 2020-03-18 DIAGNOSIS — R768 Other specified abnormal immunological findings in serum: Secondary | ICD-10-CM | POA: Insufficient documentation

## 2020-03-18 DIAGNOSIS — Z23 Encounter for immunization: Secondary | ICD-10-CM

## 2020-03-18 DIAGNOSIS — B20 Human immunodeficiency virus [HIV] disease: Secondary | ICD-10-CM | POA: Insufficient documentation

## 2020-03-18 DIAGNOSIS — Z8673 Personal history of transient ischemic attack (TIA), and cerebral infarction without residual deficits: Secondary | ICD-10-CM | POA: Diagnosis not present

## 2020-03-18 DIAGNOSIS — F319 Bipolar disorder, unspecified: Secondary | ICD-10-CM | POA: Diagnosis not present

## 2020-03-18 DIAGNOSIS — Z7982 Long term (current) use of aspirin: Secondary | ICD-10-CM | POA: Insufficient documentation

## 2020-03-18 DIAGNOSIS — Z79899 Other long term (current) drug therapy: Secondary | ICD-10-CM | POA: Insufficient documentation

## 2020-03-18 DIAGNOSIS — Z21 Asymptomatic human immunodeficiency virus [HIV] infection status: Secondary | ICD-10-CM | POA: Insufficient documentation

## 2020-03-18 LAB — CBC WITH DIFFERENTIAL/PLATELET
Abs Immature Granulocytes: 0.02 10*3/uL (ref 0.00–0.07)
Basophils Absolute: 0.1 10*3/uL (ref 0.0–0.1)
Basophils Relative: 1 %
Eosinophils Absolute: 0.2 10*3/uL (ref 0.0–0.5)
Eosinophils Relative: 3 %
HCT: 42.3 % (ref 39.0–52.0)
Hemoglobin: 14.4 g/dL (ref 13.0–17.0)
Immature Granulocytes: 0 %
Lymphocytes Relative: 45 %
Lymphs Abs: 2.8 10*3/uL (ref 0.7–4.0)
MCH: 30.3 pg (ref 26.0–34.0)
MCHC: 34 g/dL (ref 30.0–36.0)
MCV: 88.9 fL (ref 80.0–100.0)
Monocytes Absolute: 0.5 10*3/uL (ref 0.1–1.0)
Monocytes Relative: 7 %
Neutro Abs: 2.8 10*3/uL (ref 1.7–7.7)
Neutrophils Relative %: 44 %
Platelets: 317 10*3/uL (ref 150–400)
RBC: 4.76 MIL/uL (ref 4.22–5.81)
RDW: 11.9 % (ref 11.5–15.5)
WBC: 6.3 10*3/uL (ref 4.0–10.5)
nRBC: 0 % (ref 0.0–0.2)

## 2020-03-18 LAB — CHLAMYDIA/NGC RT PCR (ARMC ONLY)
Chlamydia Tr: NOT DETECTED
N gonorrhoeae: NOT DETECTED

## 2020-03-18 LAB — COMPREHENSIVE METABOLIC PANEL
ALT: 23 U/L (ref 0–44)
AST: 22 U/L (ref 15–41)
Albumin: 4.7 g/dL (ref 3.5–5.0)
Alkaline Phosphatase: 62 U/L (ref 38–126)
Anion gap: 12 (ref 5–15)
BUN: 16 mg/dL (ref 6–20)
CO2: 26 mmol/L (ref 22–32)
Calcium: 9.8 mg/dL (ref 8.9–10.3)
Chloride: 97 mmol/L — ABNORMAL LOW (ref 98–111)
Creatinine, Ser: 1.3 mg/dL — ABNORMAL HIGH (ref 0.61–1.24)
GFR, Estimated: >60 mL/min (ref >60)
Glucose, Bld: 101 mg/dL — ABNORMAL HIGH (ref 70–99)
Potassium: 3.9 mmol/L (ref 3.5–5.1)
Sodium: 135 mmol/L (ref 135–145)
Total Bilirubin: 0.9 mg/dL (ref 0.3–1.2)
Total Protein: 8 g/dL (ref 6.5–8.1)

## 2020-03-18 NOTE — Progress Notes (Signed)
NAME: Darrell Kennedy  DOB: 10/05/1980  MRN: [id number]  Date/Time: 03/18/2020 11:05 AM  Subjective:  Follow up HIV visit ? Darrell Kennedy is a 39 y.o. with a history of HIV Is here for follow up. Last seen in  June 2021 On Biktarvy and 100% adherent Had tooth pain and is going for wisdom teeth extraction tomorrow at [facility] No more TIA like episodes with he had in oct 2020 for which he was worked up [place] a job at replacement limited in the [employer]    02/12/19   with TIA like episode when he felt the left arm and leg tingling preceded by palpitation. Was transient. underwent work up with Neg MRI, ECHO, TEE which did not show any intracardiac shunting. He saw neurologist Dr.Shah at the [facility] clinic on 02/17/19  and he sent hypercoagulable panel ESR, CRP - Factor V Leiden mutation - Antithrombin III functional assay - MTHFR gene mutation - Protein C and S activity (functional assay) - Homocysteine - Lupus anticoagulant comprehensive - Antiphospholipid syndrome (includes aPTT, PT, INR, Thrombin time, Dilute Viper Venom Time, Hexagonal phase phospholipid, Anticoardiolipin antibody IgG and IgM, Beta - 2 Glycoprotein IgG and IgM,   Anticardiolipin IgM  ab was elevated at 13 ( 0-12 ) N and homocysteine was 16( 0-14.5) Started on methylfolate  and aspirin . seen heme once Dr.Finnegan .    HIV history HIV diagnosed NOV 2015 in  [place] when he had flu like symptoms and knew he had acute HIV and went and got tested. Nadir Cd4 was > 700  He saw Dr.PAul Tamala Julian and was started on isentress and truvada. He later changed to [facility]. Since end of 2017 he was off treatment because of lack of insurance. He moved to [facility] in 2018 and did not engage in care because of lack on insurance. 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 Past Medical History:  Diagnosis Date  . Heart  murmur   . HIV (human immunodeficiency virus infection) ([place])   . HLD (hyperlipidemia)   . Stroke [facility])     Surgical History -none  FH Father- diabetes, HTN Mother-adrenal tumor removed  ?SH Non smoker No illicit drug use Occasional alcohol Has traveled to [place] in 2013 Lives with his parents  ? Current Outpatient Medications  Medication Sig Dispense Refill  . aspirin (ASPIRIN CHILDRENS) 81 MG chewable tablet Chew 1 tablet (81 mg total) by mouth daily. 120 tablet 0  . atorvastatin (LIPITOR) 20 MG tablet Take 1 tablet (20 mg total) by mouth daily. 90 tablet 3  . bictegravir-emtricitabine-tenofovir AF (BIKTARVY) 50-200-25 MG TABS tablet Take 1 tablet by mouth daily. 30 tablet 4  . clonazePAM (KLONOPIN) 0.5 MG tablet Take by mouth.    . escitalopram (LEXAPRO) 10 MG tablet Take 0.5-1 tablets (5-10 mg total) by mouth daily. Start taking half tablet for 2 week and then increase to 1 tablet daily 90 tablet 0  . ketoconazole (NIZORAL) 2 % shampoo Apply topically.    . lamoTRIgine (LAMICTAL) 200 MG tablet Take 1 tablet (200 mg total) by mouth daily. 90 tablet 0  . lamoTRIgine (LAMICTAL) 25 MG tablet Take 1 tablet (25 mg total) by mouth 2 (two) times daily. Start taking with 200 mg daily 180 tablet 0  . Multiple Vitamin (MULTIVITAMIN WITH MINERALS) TABS tablet Take 1 tablet by mouth daily.    Marland Kitchen  zolpidem (AMBIEN) 10 MG tablet Take 1 tablet (10 mg total) by mouth at bedtime as needed for sleep. 90 tablet 0   No current facility-administered medications for this visit.    REVIEW OF SYSTEMS:  Const: negative fever, negative chills, negative weight loss Eyes: negative diplopia or visual changes, negative eye pain ENT: negative coryza, negative sore throat Resp: negative cough, hemoptysis, dyspnea Cards: negative for chest pain, palpitations, lower extremity edema GU: negative for frequency, dysuria and hematuria Skin: negative for rash and pruritus Heme: negative for easy bruising and  gum/nose bleeding MS: negative for myalgias, arthralgias, back pain and muscle weakness Neurolo:negative for headaches, dizziness, vertigo, memory problems  Psych: bipolar disorder  Objective:  VITALS:  BP 127/62   Pulse 86   Resp 16   Ht _0  (1.727 m)   Wt 174 lb (78.9 kg)   SpO2 98%   BMI 26.46 kg/m  PHYSICAL EXAM:  General: Alert, cooperative, no distress, appears stated age.  Head: Normocephalic, without obvious abnormality, atraumatic. Eyes: Conjunctivae clear, anicteric sclerae. Pupils are equal Nose: Nares normal. No drainage or sinus tenderness. Throat: Lips, mucosa, and tongue normal. No Thrush Neck: Supple, symmetrical, no adenopathy, thyroid: non tender no carotid bruit and no JVD. Back: No CVA tenderness. Lungs: Clear to auscultation bilaterally. No Wheezing or Rhonchi. No rales. Heart: Regular rate and rhythm, no murmur, rub or gallop. Abdomen: Soft, non-tender,not distended. Bowel sounds normal. No masses Extremities: Extremities normal, atraumatic, no cyanosis. No edema. No clubbing Skin: No rashes or lesions. Not Jaundiced Lymph: Cervical, supraclavicular normal. Neurologic: Grossly non-focal Pertinent Labs  IMAGING RESULTS: Health maintenance Vaccination  Vaccine Date last given comment  Influenza 01/24/19   Hepatitis B 05/15/2014,2/29 &11/24/14 [place]  Hepatitis A 05/15/14 &06/15/14   Prevnar-PCV-13 04/02/2014   Pneumovac-PPSV-23 09/10/18   TdaP 05/15/14   HPV 09/23/19, 10/21/19 and 03/18/20   Shingrix ( zoster vaccine)    Corona virus vaccine X 3 08/05/19, 5/21, 02/06/20    ______________________  Labs Lab Result  Date comment  HIV VL <20 09/23/19   CD4 713(31%) 09/23/19   Genotype Negative 04/09/2014 [facility]- [place]  [id number] NEG 04/02/2014   HIV antibody Reactive 05/07/18   RPR 1:1 ( but TAP NEG 05/07/18 False positive  Quantiferon Gold NR 05/07/18   Hep C ab NR 05/07/18   Hepatitis B-ab,ag,c Ab-positive 05/07/18 vaccinated  Hepatitis A-IgM, IgG /T      Lipid 161/58/89/70 09/23/19   GC/CHL NR 09/23/19   PAP LGSIL 09/23/19 Will repeat in 6 months  HB,PLT,Cr, LFT 14/284/1.34/N      Preventive  Procedure Result  Date comment  colonoscopy     Mammogram     Dental exam     Opthal       Impression/Recommendation ? HIV- on Biktarvy- doing well- 100% adherent . Will do labs today  TIA like episode-none in the past year  investigated and found to have borderline high Anticardiolipin IgM and homocysteine- on aspirin and methyl folate Followed by heme onc who does not think the labs are significant It was thought to be due to his medication ( antidepressant/  Bipolar  disorder- followed by Psychiatrist- on lamictal 239m QD, seroquel being tapered- trazadone for sleep  Getting dental work tomorrow- getting the wisdom teeth removed  anal PAP -LGSIL- will repeat in 6 months HPV vaccine has received 2 doses- will get the third dose today Labs [place]  Follow up 6 months ___________________________________________________ Discussed with patient in detail

## 2020-03-18 NOTE — Addendum Note (Signed)
Addended by: Clayborne Artist A on: 03/18/2020 04:33 PM   Modules accepted: Orders

## 2020-03-18 NOTE — Patient Instructions (Signed)
You are here for follow up-of Virus - you are on biktarvy third dose of HPV you  .You will do labs today and we will follow up in 6 months

## 2020-03-19 ENCOUNTER — Telehealth: Payer: Self-pay | Admitting: Psychiatry

## 2020-03-19 DIAGNOSIS — F5105 Insomnia due to other mental disorder: Secondary | ICD-10-CM

## 2020-03-19 HISTORY — PX: WISDOM TOOTH EXTRACTION: SHX21

## 2020-03-19 LAB — T-HELPER CELLS CD4/CD8 %
% CD 4 Pos. Lymph.: 28.1 % — ABNORMAL LOW (ref 30.8–58.5)
Absolute CD 4 Helper: 815 /uL (ref 359–1519)
Basophils Absolute: 0.1 10*3/uL (ref 0.0–0.2)
Basos: 1 %
CD3+CD4+ Cells/CD3+CD8+ Cells Bld: 0.57 — ABNORMAL LOW (ref 0.92–3.72)
CD3+CD8+ Cells # Bld: 1436 /uL — ABNORMAL HIGH (ref 109–897)
CD3+CD8+ Cells NFr Bld: 49.5 % — ABNORMAL HIGH (ref 12.0–35.5)
EOS (ABSOLUTE): 0.2 10*3/uL (ref 0.0–0.4)
Eos: 3 %
Hematocrit: 42.8 % (ref 37.5–51.0)
Hemoglobin: 14.5 g/dL (ref 13.0–17.7)
Immature Grans (Abs): 0 10*3/uL (ref 0.0–0.1)
Immature Granulocytes: 1 %
Lymphocytes Absolute: 2.9 10*3/uL (ref 0.7–3.1)
Lymphs: 45 %
MCH: 30.5 pg (ref 26.6–33.0)
MCHC: 33.9 g/dL (ref 31.5–35.7)
MCV: 90 fL (ref 79–97)
Monocytes Absolute: 0.4 10*3/uL (ref 0.1–0.9)
Monocytes: 7 %
Neutrophils Absolute: 2.7 10*3/uL (ref 1.4–7.0)
Neutrophils: 43 %
Platelets: 335 10*3/uL (ref 150–450)
RBC: 4.75 x10E6/uL (ref 4.14–5.80)
RDW: 12.5 % (ref 11.6–15.4)
WBC: 6.3 10*3/uL (ref 3.4–10.8)

## 2020-03-19 LAB — HIV-1 RNA QUANT-NO REFLEX-BLD
HIV 1 RNA Quant: <20 copies/mL
LOG10 HIV-1 RNA: UNDETERMINED log10copy/mL

## 2020-03-19 LAB — CHLAMYDIA/NGC RT PCR (ARMC ONLY)

## 2020-03-19 MED ORDER — ZOLPIDEM TARTRATE ER 12.5 MG PO TBCR: 12.5000 mg | EXTENDED_RELEASE_TABLET | Freq: Every evening | ORAL | 0 refills | Status: DC | PRN

## 2020-03-19 NOTE — Telephone Encounter (Signed)
Called patient to discuss Ambien 10 mg will not be covered by his health insurance plan. We will send alternative -Ambien extended release 12.5 mg.  Patient agrees.

## 2020-03-20 LAB — GC/CHLAMYDIA PROBE AMP
Chlamydia trachomatis, NAA: NEGATIVE
Neisseria Gonorrhoeae by PCR: NEGATIVE

## 2020-03-20 LAB — QUANTIFERON-TB GOLD PLUS (RQFGPL)
QuantiFERON Mitogen Value: >10 IU/mL
QuantiFERON Nil Value: 0.68 IU/mL
QuantiFERON TB1 Ag Value: 0.56 IU/mL
QuantiFERON TB2 Ag Value: 0.63 IU/mL

## 2020-03-20 LAB — QUANTIFERON-TB GOLD PLUS: QuantiFERON-TB Gold Plus: NEGATIVE

## 2020-03-24 NOTE — Progress Notes (Signed)
Name: Darrell Kennedy   MRN: [id number]    DOB: 1981-04-08   Date:03/30/2020       Progress Note  Subjective  Chief Complaint  Chief Complaint  Patient presents with  . Annual Exam    HPI  Patient is a 39 year old male former patient of Darrell Kennedy Last visit in December 2020 Follows up today for annual physical  All in all, doing well  He had recent wisdom teeth extraction in early December, doing better now. No more TIA like episodes with he had in oct 2020 for which he was worked [employer] better, using Ambien prn, and did discuss the risk/benefits with this medicine, and he tries to use it every other day at the most presently.   He did just have extensive labs done 03/18/2020 which were reviewed   Chemistry      Component Value Date/Time   NA 135 03/18/2020 1206   K 3.9 03/18/2020 1206   CL 97 (L) 03/18/2020 1206   CO2 26 03/18/2020 1206   BUN 16 03/18/2020 1206   CREATININE 1.30 (H) 03/18/2020 1206      Component Value Date/Time   CALCIUM 9.8 03/18/2020 1206   ALKPHOS 62 03/18/2020 1206   AST 22 03/18/2020 1206   ALT 23 03/18/2020 1206   BILITOT 0.9 03/18/2020 1206     Lab Results  Component Value Date   WBC 6.3 03/18/2020   WBC 6.3 03/18/2020   HGB 14.4 03/18/2020   HGB 14.5 03/18/2020   HCT 42.3 03/18/2020   HCT 42.8 03/18/2020   MCV 88.9 03/18/2020   MCV 90 03/18/2020   PLT 317 03/18/2020   PLT 335 03/18/2020     -Hyperlipidemia- taking atorvastatin - $RemoveBefore'20mg'tkhjseRYVduGs$  daily (was on Crestor previously, changed to atorvastatin due to insurance coverage issue a year ago). Lab Results  Component Value Date   CHOL 161 09/23/2019   HDL 58 09/23/2019   LDLCALC 89 09/23/2019   TRIG 70 09/23/2019   CHOLHDL 2.8 09/23/2019   Denies myalgias  - Bipolar type II/insomnia: Followed by psychiatry, Darrell Kennedy   - ? TIA sx's 2020 -  02/12/19 with TIA like episode when he felt the left arm and leg tingling preceded by palpitation. Was transient. underwent work  up with Neg MRI, ECHO, TEE which did not show any intracardiac shunting. He saw neurologist DarrellShah at the [facility] clinic on 02/17/19  and he sent hypercoagulable panel ESR, CRP - Factor V Leiden mutation - Antithrombin III functional assay - MTHFR gene mutation - Protein C and S activity (functional assay) - Homocysteine - Lupus anticoagulant comprehensive - Antiphospholipid syndrome (includes aPTT, PT, INR, Thrombin time, Dilute Viper Venom Time, Hexagonal phase phospholipid, Anticoardiolipin antibody IgG and IgM, Beta - 2 Glycoprotein IgG and IgM,   Anticardiolipin IgM  ab was elevated at 13 ( 0-12 ) N and homocysteine was 16( 0-14.5) Started on methylfolate  and aspirin . 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 He later changed to [facility]. Since end of 2017 he was off treatment because of lack of insurance. He moved to [place] in 2018 anddid not engage in care because of lack on insurance.  Pt has insurance now [facility] place)  Nadir [phone] VL>200,000 OInone HAARt history truvada isentress Genvoya Acquired thru-sex Genotype done in [place] ? Infectious disease has followed his vaccination status.  (Darrell Kennedy summarized in their last note earlier this month) He has had 2 Covid vaccines as well as a booster, and flu shot  USPSTF grade A and B recommendations:  Diet: Balanced, try to eat healthy. Exercise: pretty regularly, cardio and resistance training, was a pro dancer   Depression: phq 9 is negative Depression screen [facility] 2/9 03/27/2019 02/20/2019 11/25/2018 10/07/2018  Decreased Interest 0 $RemoveBe'1 1 1  'OHdgCQEKy$ Down, Depressed, Hopeless 0 $RemoveBe'1 1 1  'FndJJvJtM$ PHQ - 2 Score 0 $Remov'2 2 2  'wmGxZP$ Altered sleeping $RemoveBeforeDE'2 3 3 'lAcKXyPuDVlZWio$ 0  Tired,  decreased energy 0 2 1 0  Change in appetite 0 0 1 0  Feeling bad or failure about yourself  0 1 1 0  Trouble concentrating 0 2 2 0  Moving slowly or fidgety/restless 0 0 1 0  Suicidal thoughts 0 0 0 0  PHQ-9 Score $RemoveBef'2 10 11 2  'gBtwTrTuuM$ Difficult doing work/chores Not difficult at all Somewhat difficult Somewhat difficult Not difficult at all   Sees Darrell Kennedy, psychiatry which has been helpful  Hypertension:  BP Readings from Last 3 Encounters:  03/30/20 120/80  03/18/20 127/62  09/23/19 105/76    Obesity: Wt Readings from Last 3 Encounters:  03/30/20 168 lb 4.8 oz (76.3 kg)  03/18/20 174 lb (78.9 kg)  09/23/19 180 lb (81.6 kg)   BMI Readings from Last 3 Encounters:  03/30/20 26.73 kg/m  03/18/20 26.46 kg/m  09/23/19 27.37 kg/m   Gained weight on seroquel, changed to lexapro and weight came down  Lipids:  Lab Results  Component Value Date   CHOL 161 09/23/2019   CHOL 157 02/12/2019   CHOL 231 (H) 11/25/2018   Lab Results  Component Value Date   HDL 58 09/23/2019   HDL 52 02/12/2019   HDL 45 11/25/2018   Lab Results  Component Value Date   LDLCALC 89 09/23/2019   LDLCALC 78 02/12/2019   LDLCALC 159 (H) 11/25/2018   Lab Results  Component Value Date   TRIG 70 09/23/2019   TRIG 135 02/12/2019   TRIG 138 11/25/2018   Lab Results  Component Value Date   CHOLHDL 2.8 09/23/2019   CHOLHDL 3.0 02/12/2019   CHOLHDL 5.1 (H) 11/25/2018   No results found for: LDLDIRECT  Glucose:  Glucose, Bld  Date Value Ref Range Status  03/18/2020 101 (H) 70 - 99 mg/dL Final    Comment:    Glucose reference range applies only to samples taken after fasting for at least 8 hours.  09/23/2019 111 (H) 70 - 99 mg/dL Final    Comment:    Glucose reference range applies only to samples taken after fasting for at least 8 hours.  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  Had GC/chlamydia testing with 03/18/2020 labs and negative, last RPR in June 2021 nonreactive  Had LGSIL on anal Pap, plans are a follow-up again in 6 months per infectious disease Hep C: negative in  Jan, 2020   Skin cancer: No concerning lesions; just had derm appt and "got an all-clear" Colorectal cancer: Denies family of colorectal cancer, no changes in BM's - no blood in stool, dark and tarry stool, constipation/diarrhea.  Prostate cancer/Testicular Cancer: No family history; discussed monitoring for testicular abnormalities and changes in urinary symptoms.   Lung cancer: Never smoker. N/A  Low Dose CT Chest recommended if Age 36-80 years, 30 pack-year currently smoking OR have quit w/in 15years. Patient does not qualify.   AAA: N/A The USPSTF recommends one-time screening with ultrasonography in men ages 61 to 50 years who have ever smoked ECG:  Seeing Cardiology, no CP, palp's, SOB, no LE swelling  Advanced Care Planning: A voluntary discussion about advance care planning including the explanation and discussion of advance directives.  Discussed health care proxy and Living will, and the patient was able to identify a health care proxy as Telford Archambeau.  Patient does not have a living will at present time. If patient does have living will, I have requested they bring this to the clinic to be scanned in to their chart.    Patient Active Problem List   Diagnosis Date Noted  . Anxiety disorder 10/15/2019  . Bipolar disorder, in full remission, most recent episode hypomanic ([place]) 10/15/2019  . PFO (patent foramen ovale) 05/15/2019  . Pure hypercholesterolemia 03/27/2019  . OSA (obstructive sleep apnea) 03/21/2019  . 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 Bipolar 2 disorder ([place]) 03/03/2019  . 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 Palpitations 02/20/2019  . Precordial pain 02/20/2019  . TIA (transient ischemic attack) 02/13/2019  . Bipolar I disorder, most recent episode (or current) manic ([place]) 02/06/2019  . At risk for long QT syndrome 02/06/2019  . High cholesterol 11/27/2018  . HIV infection ([place]) 10/07/2018  . HIV (human immunodeficiency virus infection) ([place]) 04/02/2014    Past Surgical History:  Procedure Laterality Date  . TEE WITHOUT CARDIOVERSION N/A 03/05/2019   Procedure: TRANSESOPHAGEAL ECHOCARDIOGRAM (TEE);  Surgeon: Corey Skains, MD;  Location: [facility] ORS;  Service: Cardiovascular;  Laterality: N/A;    Family History  Problem Relation Age of Onset  . Hypertension Father   . Diabetes Father   . Kidney disease Father   . Depression Father   . Hyperlipidemia Father   . Alcohol abuse Brother   . Heart disease Paternal Grandmother   . Drug abuse Cousin     Social History   Socioeconomic History  . Marital status: Single    Spouse name: Not on file  . Number of children: 0  . Years of education: Not on file  . Highest education level: Master's degree (e.g., MA, MS, MEng, MEd, MSW, MBA)  Occupational History  . Occupation: Ship broker  Tobacco Use  . Smoking status: Never Smoker  . Smokeless tobacco: Never Used  Vaping Use  . Vaping Use: Never used  Substance and Sexual Activity  . Alcohol use: Yes    Comment: social maybe once a week  . Drug use: Never  . Sexual activity: Yes    Birth control/protection: Condom  Other Topics Concern  . Not on file  Social History Narrative  . Not on file   Social Determinants of Health   Financial Resource Strain: Not on file  Food Insecurity: Not on file  Transportation Needs: Not on file  Physical Activity: Not on file  Stress: Not on file  Social Connections: Not on file  Intimate Partner Violence: Not on file  Current Outpatient Medications:  .  aspirin (ASPIRIN CHILDRENS) 81 MG chewable tablet, Chew 1 tablet (81 mg total) by mouth daily., Disp: 120 tablet, Rfl: 0 .  atorvastatin (LIPITOR) 20 MG tablet, Take 1 tablet (20 mg total) by mouth daily., Disp: 90 tablet, Rfl: 3 .  bictegravir-emtricitabine-tenofovir AF (BIKTARVY) 50-200-25 MG TABS tablet, Take 1 tablet by mouth daily., Disp: 30 tablet, Rfl: 4 .  clonazePAM (KLONOPIN) 0.5 MG tablet, Take 0.5 mg by mouth daily as needed. , Disp: , Rfl:  .  escitalopram (LEXAPRO) 10 MG tablet, Take 0.5-1 tablets (5-10 mg total) by mouth daily. Start taking half tablet for 2 week and then increase to 1 tablet daily (Patient taking differently: Take 10 mg by mouth daily. ), Disp: 90 tablet, Rfl: 0 .  ketoconazole (NIZORAL) 2 % shampoo, Apply topically., Disp: , Rfl:  .  lamoTRIgine (LAMICTAL) 200 MG tablet, Take 1 tablet (200 mg total) by mouth daily., Disp: 90 tablet, Rfl: 0 .  lamoTRIgine (LAMICTAL) 25 MG tablet, Take 1 tablet (25 mg total) by mouth 2 (two) times daily. Start taking with 200 mg daily, Disp: 180 tablet, Rfl: 0 .  Multiple Vitamin (MULTIVITAMIN WITH MINERALS) TABS tablet, Take 1 tablet by mouth daily., Disp: , Rfl:  .  zolpidem (AMBIEN CR) 12.5 MG CR tablet, Take 1 tablet (12.5 mg total) by mouth at bedtime as needed for sleep., Disp: 90 tablet, Rfl: 0  Allergies  Allergen Reactions  . Amoxicillin Rash     ROS: As noted above in HPI denies any recent unintentional weight loss, No recent fevers or other Covid concerning sx's, had Covid booster No increase headaches, vision changes No CP, palpitations,  No increased SOB,   No persistent abdominal pains or change in bowel habits,  No rectal bleeding or dark/black stools,  No flank pains or dysuria,  No frequency or urgency No lower extremity swelling,  No increase joint aches or muscle aches,  No numbness, tingling or weakness in the extremities Denies other specific complaints on  systems review except as noted in HPI    Objective  Vitals:   03/30/20 0809  BP: 120/80  Pulse: 81  Resp: 16  Temp: 98.2 F (36.8 C)  TempSrc: Oral  SpO2: 95%  Weight: 168 lb 4.8 oz (76.3 kg)  Height: 5' 6.54" (1.69 m)    Body mass index is 26.73 kg/m.  NAD, masked, pleasant HEENT - sclera anicteric, PERRL, EOMI, positive glasses, conj - non-inj'ed, pharynx clear Neck - supple, no adenopathy, no TM, carotids 2+ and = without bruits bilat Car - RRR without m/g/r Pulm- CTA without wheeze or rales Abd - soft, NT diffusely, ND,  Back - no CVA tenderness,  Ext - no LE edema,  GU - no swelling in inguinal/suprapubic region, NT  Normal descended testes bilaterally, no masses palpated, no lesions, no discharge Rectal: not done, prostate exam deferred due to relatively young age and lack of concerning symptoms Neuro - affect was not flat, appropriate with conversation  Grossly non-focal with good strength on testing, sensation intact to LT in distal extremities, Romberg neg, no pronator drift, good balance on one foot, good finger to nose, good tandem walk   Recent Results (from the past 2160 hour(s))  Chlamydia/NGC rt PCR ([facility] only)     Status: Abnormal   Collection Time: 03/18/20 11:30 AM   Specimen: Urine  Result Value Ref Range   Chlamydia Tr (A) NOT DETECTED    THIS TEST WAS ORDERED IN ERROR  AND HAS BEEN CREDITED.   N gonorrhoeae (A) NOT DETECTED    THIS TEST WAS ORDERED IN ERROR AND HAS BEEN CREDITED.    Comment: (NOTE) This CT/NG assay has not been evaluated in patients with a history of  hysterectomy. Performed at [facility], [address] [place], [place] [address]   Chlamydia/NGC rt PCR [facility] only)     Status: None   Collection Time: 03/18/20 11:30 AM   Specimen: Throat  Result Value Ref Range   Specimen source GC/Chlam THROAT    Chlamydia Tr NOT DETECTED NOT DETECTED   N gonorrhoeae NOT DETECTED NOT DETECTED    Comment: (NOTE) This CT/NG  assay has not been evaluated in patients with a history of  hysterectomy. Performed at [facility] [address] [place], [place] [address]   GC/Chlamydia Probe Amp     Status: None   Collection Time: 03/18/20 11:30 AM  Result Value Ref Range   Chlamydia trachomatis, NAA Negative Negative   Neisseria Gonorrhoeae by PCR Negative Negative    Comment: (NOTE) Performed At: [facility] [address] [place], [place] [id number] Jolene Schimke MD OZ:[id number]    CT/NG NAA Source URINE, RANDOM     Comment: Performed at [facility], [address] [place], [place] [address]  QuantiFERON-TB [place] Plus     Status: None   Collection Time: 03/18/20 12:06 PM  Result Value Ref Range   QuantiFERON Incubation Incubation performed.    QuantiFERON-TB Gold Plus Negative Negative    Comment: (NOTE) Chemiluminescence immunoassay methodology Performed At: [facility] [address] [place], [place] [id number] Jolene Schimke MD UE:[id number]   CBC with Differential/Platelet     Status: None   Collection Time: 03/18/20 12:06 PM  Result Value Ref Range   WBC 6.3 4.0 - 10.5 K/uL   RBC 4.76 4.22 - 5.81 MIL/uL   Hemoglobin 14.4 13.0 - 17.0 g/dL   HCT 15.0 56.9 - 79.4 %   MCV 88.9 80.0 - 100.0 fL   MCH 30.3 26.0 - 34.0 pg   MCHC 34.0 30.0 - 36.0 g/dL   RDW 80.1 65.5 - 37.4 %   Platelets 317 150 - 400 K/uL   nRBC 0.0 0.0 - 0.2 %   Neutrophils Relative % 44 %   Neutro Abs 2.8 1.7 - 7.7 K/uL   Lymphocytes Relative 45 %   Lymphs Abs 2.8 0.7 - 4.0 K/uL   Monocytes Relative 7 %   Monocytes Absolute 0.5 0.1 - 1.0 K/uL   Eosinophils Relative 3 %   Eosinophils Absolute 0.2 0.0 - 0.5 K/uL   Basophils Relative 1 %   Basophils Absolute 0.1 0.0 - 0.1 K/uL   Immature Granulocytes 0 %   Abs Immature Granulocytes 0.02 0.00 - 0.07 K/uL    Comment: Performed at [facility], [address] [place], [place] [address]  Comprehensive metabolic panel      Status: Abnormal   Collection Time: 03/18/20 12:06 PM  Result Value Ref Range   Sodium 135 135 - 145 mmol/L   Potassium 3.9 3.5 - 5.1 mmol/L   Chloride 97 (L) 98 - 111 mmol/L   CO2 26 22 - 32 mmol/L   Glucose, Bld 101 (H) 70 - 99 mg/dL    Comment: Glucose reference range applies only to samples taken after fasting for at least 8 hours.   BUN 16 6 - 20 mg/dL   Creatinine, Ser 8.67 (H) 0.61 - 1.24 mg/dL   Calcium  9.8 8.9 - 10.3 mg/dL   Total Protein 8.0 6.5 - 8.1 g/dL   Albumin 4.7 3.5 - 5.0 g/dL   AST 22 15 - 41 U/L   ALT 23 0 - 44 U/L   Alkaline Phosphatase 62 38 - 126 U/L   Total Bilirubin 0.9 0.3 - 1.2 mg/dL   GFR, Estimated >60 >60 mL/min    Comment: (NOTE) Calculated using the CKD-EPI Creatinine Equation (2021)    Anion gap 12 5 - 15    Comment: Performed at [facility], [address] [place], [place] [address]  T-helper cells CD4/CD8 %     Status: Abnormal   Collection Time: 03/18/20 12:06 PM  Result Value Ref Range   Absolute CD 4 Helper 815 359 - 1,519 /uL   % CD 4 Pos. Lymph. 28.1 (L) 30.8 - 58.5 %   CD3+CD8+ Cells # Bld 1,436 (H) 109 - 897 /uL   CD3+CD8+ Cells NFr Bld 49.5 (H) 12.0 - 35.5 %   CD3+CD4+ Cells/CD3+CD8+ Cells Bld 0.57 (L) 0.92 - 3.72   WBC 6.3 3.4 - 10.8 x10E3/uL   RBC 4.75 4.14 - 5.80 x10E6/uL   Hemoglobin 14.5 13.0 - 17.7 g/dL   Hematocrit 42.8 37.5 - 51.0 %   MCV 90 79 - 97 fL   MCH 30.5 26.6 - 33.0 pg   MCHC 33.9 31.5 - 35.7 g/dL   RDW 12.5 11.6 - 15.4 %   Platelets 335 150 - 450 x10E3/uL   Neutrophils 43 Not Estab. %   Lymphs 45 Not Estab. %   Monocytes 7 Not Estab. %   Eos 3 Not Estab. %   Basos 1 Not Estab. %   Neutrophils Absolute 2.7 1.4 - 7.0 x10E3/uL   Lymphocytes Absolute 2.9 0.7 - 3.1 x10E3/uL   Monocytes Absolute 0.4 0.1 - 0.9 x10E3/uL   EOS (ABSOLUTE) 0.2 0.0 - 0.4 x10E3/uL   Basophils Absolute 0.1 0.0 - 0.2 x10E3/uL   Immature Granulocytes 1 Not Estab. %   Immature Grans (Abs) 0.0 0.0 - 0.1 x10E3/uL    Comment:  (NOTE) Performed At: [facility]  [place], [place] [id number] Rush Farmer MD UX:[id number]   HIV-1 RNA quant-no reflex-bld     Status: None   Collection Time: 03/18/20 12:06 PM  Result Value Ref Range   HIV 1 RNA Quant <20 copies/mL    Comment: (NOTE) HIV-1 RNA detected The reportable range for this assay is 20 to 10,000,000 copies HIV-1 RNA/mL. Performed At: [facility] [place], [place] [id number] Rush Farmer MD JS:[id number]    LOG10 HIV-1 RNA UNABLE TO CALCULATE log10copy/mL    Comment: (NOTE) Unable to calculate result since non-numeric result obtained for component test.   QuantiFERON-TB Gold Plus     Status: None   Collection Time: 03/18/20 12:06 PM  Result Value Ref Range   QuantiFERON Criteria Comment     Comment: (NOTE) The QuantiFERON-TB Gold Plus result is determined by subtracting the Nil value from either TB antigen (Ag) tube. The mitogen tube serves as a control for the test.    QuantiFERON TB1 Ag Value 0.56 IU/mL   QuantiFERON TB2 Ag Value 0.63 IU/mL   QuantiFERON Nil Value 0.68 IU/mL   QuantiFERON Mitogen Value >10.00 IU/mL    Comment: (NOTE) Performed At: [facility] [place], [place] [id number] Rush Farmer MD RS:[id number]      PHQ2/9: Depression screen [facility] 2/9 03/27/2019 02/20/2019 11/25/2018 10/07/2018  Decreased Interest 0 1 1  1  Down, Depressed, Hopeless 0 $RemoveBe'1 1 1  'KVSvukPIu$ PHQ - 2 Score 0 $Remov'2 2 2  'JxUHwJ$ Altered sleeping $RemoveBeforeDE'2 3 3 'yPCCEHgWvNsmlWZ$ 0  Tired, decreased energy 0 2 1 0  Change in appetite 0 0 1 0  Feeling bad or failure about yourself  0 1 1 0  Trouble concentrating 0 2 2 0  Moving slowly or fidgety/restless 0 0 1 0  Suicidal thoughts 0 0 0 0  PHQ-9 Score $RemoveBef'2 10 11 2  'tSqKlElBoN$ Difficult doing work/chores Not difficult at all Somewhat difficult Somewhat difficult Not difficult at all     Fall Risk: Fall Risk  03/27/2019 02/20/2019 11/25/2018 10/07/2018  Falls in the past year? 0 0 1 0  Number falls  in past yr: 0 0 0 0  Injury with Fall? 0 0 0 0  Follow up - Falls evaluation completed Falls evaluation completed -     Functional Status Survey:      Assessment & Plan  1. General Health/Health Maintenance:    -USPSTF grade A and B recommendations reviewed with patient; age-appropriate recommendations, preventive care, screening tests, etc discussed and encouraged; healthy living encouraged; see AVS for any further patient education given to patient  -Discussed importance of regular physical activity weekly,   -Discussed importance of eating healthy diet  -Reviewed Health Maintenance:    1.  Annual physical  2. Pure hypercholesterolemia On a statin, to continue  reviewed last lipid panel done in June 2021,  recommended checking again in June 2022. Continue with regular exercise including some aerobic component as he does, in addition to continuing a healthy diet  3. Bipolar 2 disorder (HCC)/Insomnia followed by Psychiatrist- on lamictal $RemoveBef'250mg'MqaKnrbcSU$  QD, seroquel weaned to off and now on Lexapro which she notes has been helpful. Now taking an Ambien product for sleep, did discuss the risk/benefits with this medication, and some concerns with this medicine at times, and he has been trying to utilize more sporadically, about every other day presently.  4. TIA (transient ischemic attack) -like episode none in the past year  investigated and found to have borderline high Anticardiolipin IgM and homocysteine- on aspirin and methyl folate Followed by heme onc who does not think the labs are significant concern. It was thought to be due to his medication. Continues to monitor.  5. Asymptomatic HIV infection ([place]) - Seeing Dr. Delaine Lame, compliant with medications presently, last viral load less than 20 Continue to follow with infectious disease  6. Elevated homocysteine 7. Anticardiolipin antibody positive - Seeing Dr. Grayland Ormond, has follow up  We will follow-up again for an  annual physical in a years time, follow-up sooner as needed. He noted he can get his lipid panel checked with his infectious disease physician in about 6 months time, and if he does not, can follow-up here to have that done as well.  Shiela Mayer, MD

## 2020-03-25 ENCOUNTER — Ambulatory Visit: Payer: PRIVATE HEALTH INSURANCE | Admitting: Infectious Diseases

## 2020-03-30 ENCOUNTER — Encounter: Payer: Self-pay | Admitting: Internal Medicine

## 2020-03-30 ENCOUNTER — Other Ambulatory Visit: Payer: Self-pay

## 2020-03-30 ENCOUNTER — Ambulatory Visit (INDEPENDENT_AMBULATORY_CARE_PROVIDER_SITE_OTHER): Payer: PRIVATE HEALTH INSURANCE | Admitting: Internal Medicine

## 2020-03-30 VITALS — BP 120/80 | HR 81 | Temp 98.2°F | Resp 16 | Ht 66.54 in | Wt 168.3 lb

## 2020-03-30 DIAGNOSIS — Z0000 Encounter for general adult medical examination without abnormal findings: Secondary | ICD-10-CM | POA: Diagnosis not present

## 2020-03-30 DIAGNOSIS — E7800 Pure hypercholesterolemia, unspecified: Secondary | ICD-10-CM | POA: Diagnosis not present

## 2020-03-30 DIAGNOSIS — F3181 Bipolar II disorder: Secondary | ICD-10-CM

## 2020-03-30 DIAGNOSIS — G459 Transient cerebral ischemic attack, unspecified: Secondary | ICD-10-CM | POA: Diagnosis not present

## 2020-03-30 DIAGNOSIS — Z21 Asymptomatic human immunodeficiency virus [HIV] infection status: Secondary | ICD-10-CM

## 2020-03-30 DIAGNOSIS — R7989 Other specified abnormal findings of blood chemistry: Secondary | ICD-10-CM

## 2020-03-30 DIAGNOSIS — R760 Raised antibody titer: Secondary | ICD-10-CM

## 2020-03-30 DIAGNOSIS — F5105 Insomnia due to other mental disorder: Secondary | ICD-10-CM

## 2020-03-30 NOTE — Patient Instructions (Signed)
Preventive Care 19-38 Years Old, Male Preventive care refers to lifestyle choices and visits with your health care provider that can promote health and wellness. This includes:  A yearly physical exam. This is also called an annual well check.  Regular dental and eye exams.  Immunizations.  Screening for certain conditions.  Healthy lifestyle choices, such as eating a healthy diet, getting regular exercise, not using drugs or products that contain nicotine and tobacco, and limiting alcohol use. What can I expect for my preventive care visit? Physical exam Your health care provider will check:  Height and weight. These may be used to calculate body mass index (BMI), which is a measurement that tells if you are at a healthy weight.  Heart rate and blood pressure.  Your skin for abnormal spots. Counseling Your health care provider may ask you questions about:  Alcohol, tobacco, and drug use.  Emotional well-being.  Home and relationship well-being.  Sexual activity.  Eating habits.  Work and work Statistician. What immunizations do I need?  Influenza (flu) vaccine  This is recommended every year. Tetanus, diphtheria, and pertussis (Tdap) vaccine  You may need a Td booster every 10 years. Varicella (chickenpox) vaccine  You may need this vaccine if you have not already been vaccinated. Human papillomavirus (HPV) vaccine  If recommended by your health care provider, you may need three doses over 6 months. Measles, mumps, and rubella (MMR) vaccine  You may need at least one dose of MMR. You may also need a second dose. Meningococcal conjugate (MenACWY) vaccine  One dose is recommended if you are 45-76 years old and a Market researcher living in a residence hall, or if you have one of several medical conditions. You may also need additional booster doses. Pneumococcal conjugate (PCV13) vaccine  You may need this if you have certain conditions and were not  previously vaccinated. Pneumococcal polysaccharide (PPSV23) vaccine  You may need one or two doses if you smoke cigarettes or if you have certain conditions. Hepatitis A vaccine  You may need this if you have certain conditions or if you travel or work in places where you may be exposed to hepatitis A. Hepatitis B vaccine  You may need this if you have certain conditions or if you travel or work in places where you may be exposed to hepatitis B. Haemophilus influenzae type b (Hib) vaccine  You may need this if you have certain risk factors. You may receive vaccines as individual doses or as more than one vaccine together in one shot (combination vaccines). Talk with your health care provider about the risks and benefits of combination vaccines. What tests do I need? Blood tests  Lipid and cholesterol levels. These may be checked every 5 years starting at age 17.  Hepatitis C test.  Hepatitis B test. Screening   Diabetes screening. This is done by checking your blood sugar (glucose) after you have not eaten for a while (fasting).  Sexually transmitted disease (STD) testing. Talk with your health care provider about your test results, treatment options, and if necessary, the need for more tests. Follow these instructions at home: Eating and drinking   Eat a diet that includes fresh fruits and vegetables, whole grains, lean protein, and low-fat dairy products.  Take vitamin and mineral supplements as recommended by your health care provider.  Do not drink alcohol if your health care provider tells you not to drink.  If you drink alcohol: ? Limit how much you have to 0-2  drinks a day. ? Be aware of how much alcohol is in your drink. In the [place] one drink equals one 12 oz bottle of beer (355 mL), one 5 oz glass of wine (148 mL), or one 1 oz glass of hard liquor (44 mL). Lifestyle  Take daily care of your teeth and gums.  Stay active. Exercise for at least 30 minutes on 5 or  more days each week.  Do not use any products that contain nicotine or tobacco, such as cigarettes, e-cigarettes, and chewing tobacco. If you need help quitting, ask your health care provider.  If you are sexually active, practice safe sex. Use a condom or other form of protection to prevent STIs (sexually transmitted infections). What's next?  Go to your health care provider once a year for a well check visit.  Ask your health care provider how often you should have your eyes and teeth checked.  Stay up to date on all vaccines. This information is not intended to replace advice given to you by your health care provider. Make sure you discuss any questions you have with your health care provider. Document Revised: 03/28/2018 Document Reviewed: 03/28/2018 Elsevier Patient Education  2020 [employer].

## 2020-04-01 ENCOUNTER — Encounter: Payer: Self-pay | Admitting: Infectious Diseases

## 2020-04-08 ENCOUNTER — Other Ambulatory Visit: Payer: Self-pay

## 2020-04-08 ENCOUNTER — Ambulatory Visit (INDEPENDENT_AMBULATORY_CARE_PROVIDER_SITE_OTHER): Payer: PRIVATE HEALTH INSURANCE | Admitting: Licensed Clinical Social Worker

## 2020-04-08 DIAGNOSIS — F3176 Bipolar disorder, in full remission, most recent episode depressed: Secondary | ICD-10-CM

## 2020-04-08 NOTE — Progress Notes (Addendum)
Virtual Visit via Video Note  I connected with Darrell Kennedy on 04/08/20 at  8:00 AM EST by a video enabled telemedicine application and verified that I am speaking with the correct person using two identifiers.  Video connection was lost when less than 50% of the duration of the visit was complete, at which time the remainder of the visit was completed via audio only.   Location: Patient: home Provider: remote office [place], [place])   I discussed the limitations of evaluation and management by telemedicine and the availability of in person appointments. The patient expressed understanding and agreed to proceed.   The patient was advised to call back or seek an in-person evaluation if the symptoms worsen or if the condition fails to improve as anticipated.  I provided 60 minutes of non-face-to-face time during this encounter.   Paralee Pendergrass R Abel Hageman, LCSW    THERAPIST PROGRESS NOTE  Session Time: 8-9a  Participation Level: Active  Behavioral Response: NAAlertAnxious and Depressed  Type of Therapy: Individual Therapy  Treatment Goals addressed: Anxiety and Coping  Interventions: Supportive  Summary: Darrell Kennedy Kennedy a 39 y.o. male who presents with improving symptoms related to bipolar disorder diagnosis. Pt reports that he Kennedy compliant with medication, mood Kennedy stable, and that he Kennedy getting good quality and quantity of sleep.  Allowed pt to explore and express thoughts and feelings surrounding current life circumstances: pt has a new job, pt very happy about levels of personal growth, relationship readiness, improvements in leisure/social activities, improvements in family relationships, personal identity/self confidence, financial security.  Darrell Kennedy very happy about securing new position at [employer]. Pt happy with coworkers, job itself, and entire work culture. Pt wants to continue to set boundaries for self "I know that I have a tendency to immerse myself in  work". Encouraged pt to be mindful in activities and set/adhere to personal boundaries.  Allowed pt to reflect on overall personal growth throughout the year. Pt reports that he has noticed some positive changes in himself and has learned a lot throughout this process. Pt feels that all of his life experience and past positions have led to where he Kennedy right now. Encouraged pt to continue to set short and long term goals for self--and to also be aware of the present and being mindful in daily activities.   Pt wants to continue working on: impulsivity, nurturing relationships, short term goals: cont working on adapting to 8-5 "routine". Pt wants to remain focused on mental health: get into gym more, moving, finding strengths. Projects at [employer] self to do best work.   CBT--identifying thought distortions.  Praised pts ability to self-reflect and acknowledge overall personal growth. Encouraged pt continuing to foster new relationships, build on family relationships, self care, and good work/life balance.    Suicidal/Homicidal: No  Therapist Response: Darrell Kennedy continuing to make good progress with self reflection, life management, family/relational functioning, and occupational functioning and achievement as evidenced by self-report. Treatment showing good evolution and development.   Plan: Return again in 4 weeks.  Diagnosis: Axis I: Bipolar, mixed    Axis II: No diagnosis    Ernest Haber Violia Knopf, LCSW 04/08/2020

## 2020-04-20 ENCOUNTER — Telehealth (INDEPENDENT_AMBULATORY_CARE_PROVIDER_SITE_OTHER): Payer: No Typology Code available for payment source | Admitting: Psychiatry

## 2020-04-20 ENCOUNTER — Other Ambulatory Visit: Payer: Self-pay

## 2020-04-20 DIAGNOSIS — F3176 Bipolar disorder, in full remission, most recent episode depressed: Secondary | ICD-10-CM

## 2020-04-26 ENCOUNTER — Telehealth: Payer: Self-pay

## 2020-04-26 DIAGNOSIS — F3181 Bipolar II disorder: Secondary | ICD-10-CM

## 2020-04-26 MED ORDER — LAMOTRIGINE 200 MG PO TABS: 200.0000 mg | ORAL_TABLET | Freq: Every day | ORAL | 0 refills | Status: DC

## 2020-04-26 NOTE — Telephone Encounter (Addendum)
[facility] Patient Advocate Encounter  Completed and sent [employer] Advancing Access application for Biktarvy for this patient who is uninsured.    Patient is approved 01/10/22through 04/26/21.         Clearance Coots, CPhT Specialty Pharmacy Patient [facility] Phone: [phone] Fax:  [phone]

## 2020-04-26 NOTE — Telephone Encounter (Signed)
I have sent Lamictal to pharmacy. °

## 2020-04-26 NOTE — Telephone Encounter (Signed)
Medication refill - Patient left a message he has just a few days remaining of Lamictal 200 mg and is requesting a refill.  Pt's next appt not until 05/18/20 and last 90 day order was 01/28/21.

## 2020-05-13 ENCOUNTER — Other Ambulatory Visit: Payer: Self-pay

## 2020-05-13 ENCOUNTER — Ambulatory Visit (INDEPENDENT_AMBULATORY_CARE_PROVIDER_SITE_OTHER): Payer: PRIVATE HEALTH INSURANCE | Admitting: Licensed Clinical Social Worker

## 2020-05-13 DIAGNOSIS — F419 Anxiety disorder, unspecified: Secondary | ICD-10-CM

## 2020-05-13 DIAGNOSIS — F3176 Bipolar disorder, in full remission, most recent episode depressed: Secondary | ICD-10-CM

## 2020-05-13 NOTE — Progress Notes (Signed)
Virtual Visit via Video Note  I connected with [name] on 05/13/20 at  8:00 AM EST by a video enabled telemedicine application and verified that I am speaking with the correct person using two identifiers.  Video connection was lost when less than 50% of the duration of the visit was complete, at which time the remainder of the visit was completed via audio only.   Location: Patient: home Provider: [facility]   I discussed the limitations of evaluation and management by telemedicine and the availability of in person appointments. The patient expressed understanding and agreed to proceed.   I discussed the assessment and treatment plan with the patient. The patient was provided an opportunity to ask questions and all were answered. The patient agreed with the plan and demonstrated an understanding of the instructions.   The patient was advised to call back or seek an in-person evaluation if the symptoms worsen or if the condition fails to improve as anticipated.  I provided 48 minutes of non-face-to-face time during this encounter.   Zaida Reiland R Willella Harding, LCSW    THERAPIST PROGRESS NOTE  Session Time: 8-8:48a  Participation Level: Active  Behavioral Response: Neat and Well GroomedAlertEuthymic  Type of Therapy: Individual Therapy  Treatment Goals addressed: Coping  Interventions: CBT and Reframing  Summary: DARROLL BREDESON is a 40 y.o. male who presents with improving symptoms related to bipolar disorder diagnosis. Pt reports that overall mood is stable and that he feels he is managing overall symptoms of anxiety and stress well. Pt reports good quality and quantity of sleep.  Allowed pt to explore and express thoughts and feelings related to current life stressors. Pt reports that things are going well in life right now--continuing to worry about parents and their well being as they are getting older. Pt recognizes that he has not always felt this way, but feels he is  bonding more with his parents since living with them as an adult.   Pt is very happy about new puppy--36week old girl puppy. It is a rescue puppy that a friend found that had been dumped on side of road. Pt has had dogs in the past and is looking forward to spending time with puppy. Pt is allowed to bring the dog to work with him, so is looking forward to that once the puppy is older.  Allowed pt to reflect on overall progress and personal goals for the future. Pt feels that he has done a great job with mood regulation, anxiety management, navigating through difficult situations, identifying triggers and overall life management.   Suicidal/Homicidal: No  Therapist Response: [name] is progressing in overall goals to manage depressive symptoms and manic/hypomanic symptoms. Pt is reporting good restful sleep at night, which is reflective of overall progress.   Plan: Return again in 6 weeks.  Diagnosis: Axis I: Bipolar, Depressed    Axis II: No diagnosis    Ernest Haber Machai Desmith, LCSW 05/13/2020

## 2020-05-16 ENCOUNTER — Other Ambulatory Visit: Payer: Self-pay | Admitting: Psychiatry

## 2020-05-16 DIAGNOSIS — F3181 Bipolar II disorder: Secondary | ICD-10-CM

## 2020-05-18 ENCOUNTER — Other Ambulatory Visit: Payer: Self-pay

## 2020-05-18 ENCOUNTER — Telehealth (INDEPENDENT_AMBULATORY_CARE_PROVIDER_SITE_OTHER): Payer: PRIVATE HEALTH INSURANCE | Admitting: Psychiatry

## 2020-05-18 DIAGNOSIS — Z5329 Procedure and treatment not carried out because of patient's decision for other reasons: Secondary | ICD-10-CM

## 2020-05-18 NOTE — Progress Notes (Signed)
No response to call or text or video invite  

## 2020-05-30 ENCOUNTER — Other Ambulatory Visit: Payer: Self-pay | Admitting: Psychiatry

## 2020-05-30 DIAGNOSIS — F5105 Insomnia due to other mental disorder: Secondary | ICD-10-CM

## 2020-05-31 ENCOUNTER — Other Ambulatory Visit: Payer: Self-pay

## 2020-05-31 DIAGNOSIS — B20 Human immunodeficiency virus [HIV] disease: Secondary | ICD-10-CM

## 2020-05-31 MED ORDER — BIKTARVY 50-200-25 MG PO TABS: 1.0000 | ORAL_TABLET | Freq: Every day | ORAL | 4 refills | Status: DC

## 2020-06-07 ENCOUNTER — Telehealth (INDEPENDENT_AMBULATORY_CARE_PROVIDER_SITE_OTHER): Payer: PRIVATE HEALTH INSURANCE | Admitting: Psychiatry

## 2020-06-07 ENCOUNTER — Encounter: Payer: Self-pay | Admitting: Psychiatry

## 2020-06-07 ENCOUNTER — Other Ambulatory Visit: Payer: Self-pay

## 2020-06-07 DIAGNOSIS — F3176 Bipolar disorder, in full remission, most recent episode depressed: Secondary | ICD-10-CM

## 2020-06-07 DIAGNOSIS — F5105 Insomnia due to other mental disorder: Secondary | ICD-10-CM

## 2020-06-07 NOTE — Progress Notes (Signed)
Virtual Visit via Video Note  I connected with Darrell Kennedy on 06/07/20 at  3:00 PM EST by a video enabled telemedicine application and verified that I am speaking with the correct person using two identifiers.  Location Provider Location : [facility] Patient Location : Home  Participants: Patient , Provider   I discussed the limitations of evaluation and management by telemedicine and the availability of in person appointments. The patient expressed understanding and agreed to proceed.   I discussed the assessment and treatment plan with the patient. The patient was provided an opportunity to ask questions and all were answered. The patient agreed with the plan and demonstrated an understanding of the instructions.   The patient was advised to call back or seek an in-person evaluation if the symptoms worsen or if the condition fails to improve as anticipated.  BH MD OP Progress Note  06/07/2020 8:48 PM Darrell Kennedy  MRN:  [id number]  Chief Complaint:  Chief Complaint    Follow-up     HPI: Darrell Kennedy is a 40 year old Caucasian male, lives in [place], has a history of bipolar disorder, insomnia, hyperlipidemia, HIV positive was evaluated by telemedicine today.  Patient today reports he is doing well at his new job.  He really enjoys it and reports even though it is busy he is coping with that and managing it okay.  Patient reports overall his mood symptoms are stable.  Denies any mood swings, depression or anxiety symptoms.  Patient reports sleep is good.  Patient denies any suicidality, homicidality or perceptual disturbances.  Patient reports he recently had surgery to remove his wisdom tooth and was on a course of antibiotics and pain medication.  He however reports he has healed well and currently denies any pain.  Patient denies any other concerns today.  Visit Diagnosis:    ICD-10-CM   1. Bipolar disorder, in full remission, most recent episode depressed (HCC)   F31.76   2. Insomnia due to mental condition  F51.05     Past Psychiatric History: I have reviewed past psychiatric history from my progress note on 02/06/2019.  Past trials of BuSpar, Seroquel  Past Medical History:  Past Medical History:  Diagnosis Date  . Anxiety   . Depression   . Heart murmur   . HIV (human immunodeficiency virus infection) (HCC)   . HLD (hyperlipidemia)   . Stroke [facility])     Past Surgical History:  Procedure Laterality Date  . TEE WITHOUT CARDIOVERSION N/A 03/05/2019   Procedure: TRANSESOPHAGEAL ECHOCARDIOGRAM (TEE);  Surgeon: Lamar Blinks, MD;  Location: [facility] ORS;  Service: Cardiovascular;  Laterality: N/A;    Family Psychiatric History: I have reviewed family psychiatric history from my progress note on 02/06/2019  Family History:  Family History  Problem Relation Age of Onset  . Hypertension Father   . Diabetes Father   . Kidney disease Father   . Depression Father   . Hyperlipidemia Father   . Alcohol abuse Brother   . Heart disease Paternal Grandmother   . Drug abuse Cousin     Social History: I have reviewed social history from my progress note on 02/06/2019 Social History   Socioeconomic History  . Marital status: Single    Spouse name: Not on file  . Number of children: 0  . Years of education: Not on file  . Highest education level: Master's degree (e.g., MA, MS, MEng, MEd, MSW, MBA)  Occupational History  . Occupation: Consulting civil engineer  Tobacco Use  .  Smoking status: Never Smoker  . Smokeless tobacco: Never Used  . Tobacco comment: None  Vaping Use  . Vaping Use: Never used  Substance and Sexual Activity  . Alcohol use: Yes    Alcohol/week: 0.0 standard drinks    Comment: social maybe once a week  . Drug use: Never  . Sexual activity: Yes    Birth control/protection: Condom  Other Topics Concern  . Not on file  Social History Narrative  . Not on file   Social Determinants of Health   Financial Resource Strain: Low Risk    . Difficulty of Paying Living Expenses: Not hard at all  Food Insecurity: No Food Insecurity  . Worried About Programme researcher, broadcasting/film/video in the Last Year: Never true  . Ran Out of Food in the Last Year: Never true  Transportation Needs: No Transportation Needs  . Lack of Transportation (Medical): No  . Lack of Transportation (Non-Medical): No  Physical Activity: Sufficiently Active  . Days of Exercise per Week: 5 days  . Minutes of Exercise per Session: 50 min  Stress: No Stress Concern Present  . Feeling of Stress : Not at all  Social Connections: Socially Isolated  . Frequency of Communication with Friends and Family: More than three times a week  . Frequency of Social Gatherings with Friends and Family: Once a week  . Attends Religious Services: Never  . Active Member of Clubs or Organizations: No  . Attends Banker Meetings: Never  . Marital Status: Never married    Allergies:  Allergies  Allergen Reactions  . Amoxicillin Rash    Metabolic Disorder Labs: Lab Results  Component Value Date   HGBA1C 5.6 02/12/2019   MPG 114.02 02/12/2019   No results found for: PROLACTIN Lab Results  Component Value Date   CHOL 161 09/23/2019   TRIG 70 09/23/2019   HDL 58 09/23/2019   CHOLHDL 2.8 09/23/2019   VLDL 14 09/23/2019   LDLCALC 89 09/23/2019   LDLCALC 78 02/12/2019   Lab Results  Component Value Date   TSH 1.78 02/12/2019    Therapeutic Level Labs: No results found for: LITHIUM No results found for: VALPROATE No components found for:  CBMZ  Current Medications: Current Outpatient Medications  Medication Sig Dispense Refill  . aspirin (ASPIRIN CHILDRENS) 81 MG chewable tablet Chew 1 tablet (81 mg total) by mouth daily. 120 tablet 0  . atorvastatin (LIPITOR) 20 MG tablet Take 1 tablet (20 mg total) by mouth daily. 90 tablet 3  . bictegravir-emtricitabine-tenofovir AF (BIKTARVY) 50-200-25 MG TABS tablet Take 1 tablet by mouth daily. 30 tablet 4  .  chlorhexidine (PERIDEX) 0.12 % solution SMARTSIG:By Mouth    . clindamycin (CLEOCIN) 150 MG capsule Take 150 mg by mouth 4 (four) times daily.    Marland Kitchen escitalopram (LEXAPRO) 10 MG tablet Take 1 tablet (10 mg total) by mouth daily. 90 tablet 0  . ketoconazole (NIZORAL) 2 % shampoo Apply topically.    . lamoTRIgine (LAMICTAL) 200 MG tablet Take 1 tablet (200 mg total) by mouth daily. 90 tablet 0  . lamoTRIgine (LAMICTAL) 25 MG tablet TAKE 1 TABLET(25 MG) BY MOUTH TWICE DAILY. START TAKING WITH 200 MG DAILY 180 tablet 0  . Multiple Vitamin (MULTIVITAMIN WITH MINERALS) TABS tablet Take 1 tablet by mouth daily.    Marland Kitchen oxyCODONE (OXY IR/ROXICODONE) 5 MG immediate release tablet Take by mouth.    . OXYCONTIN 10 MG 12 hr tablet Take 10 mg by mouth 2 (two) times  daily.    . zolpidem (AMBIEN CR) 12.5 MG CR tablet TAKE 1 TABLET(12.5 MG) BY MOUTH AT BEDTIME AS NEEDED FOR SLEEP 90 tablet 0   No current facility-administered medications for this visit.     Musculoskeletal: Strength & Muscle Tone: UTA Gait & Station: UTA Patient leans: N/A  Psychiatric Specialty Exam: Review of Systems  Psychiatric/Behavioral: Negative for agitation, behavioral problems, confusion, decreased concentration, dysphoric mood, hallucinations, self-injury, sleep disturbance and suicidal ideas. The patient is not nervous/anxious and is not hyperactive.   All other systems reviewed and are negative.   There were no vitals taken for this visit.There is no height or weight on file to calculate BMI.  General Appearance: Casual  Eye Contact:  Fair  Speech:  Clear and Coherent  Volume:  Normal  Mood:  Euthymic  Affect:  Congruent  Thought Process:  Goal Directed and Descriptions of Associations: Intact  Orientation:  Full (Time, Place, and Person)  Thought Content: Logical   Suicidal Thoughts:  No  Homicidal Thoughts:  No  Memory:  Immediate;   Fair Recent;   Fair Remote;   Fair  Judgement:  Fair  Insight:  Fair   Psychomotor Activity:  Normal  Concentration:  Concentration: Fair and Attention Span: Fair  Recall:  [employer] of Knowledge: Fair  Language: Fair  Akathisia:  No  Handed:  Right  AIMS (if indicated): UTA  Assets:  Communication Skills Desire for Improvement Housing Social Support  ADL's:  Intact  Cognition: WNL  Sleep:  Fair   Screenings: AUDIT   Flowsheet Row Office Visit from 10/07/2018 in [facility]  Alcohol Use Disorder Identification Test Final Score (AUDIT) 3    PHQ2-9   Flowsheet Row Video Visit from 06/07/2020 in [facility] Office Visit from 03/30/2020 in [facility] Office Visit from 03/27/2019 in [facility] Office Visit from 02/20/2019 in [facility] Office Visit from 11/25/2018 in [facility]  PHQ-2 Total Score 0 0 0 2 2  PHQ-9 Total Score - 0 2 10 11     Flowsheet Row Video Visit from 06/07/2020 in [facility]  C-SSRS RISK CATEGORY No Risk       Assessment and Plan: BURDETT PINZON is a 40 year old Caucasian male, employed, lives in [place], has a history of bipolar disorder, hyperlipidemia, HIV positive was evaluated by telemedicine today.  Patient is biologically predisposed given his family history of substance abuse, health problems.  Patient is currently stable on current medication regimen.  Plan as noted below.  Plan Bipolar disorder in remission Lamictal 250 mg p.o. daily Lexapro 10 mg p.o. daily  Insomnia-stable Ambien 10 mg p.o. nightly  Continue CBT.  Follow-up in clinic in 2 months or sooner if needed.   I have spent atleast 20 minutes face to face by video with patient today. More than 50 % of the time was spent for preparing to see the patient ( e.g., review of test, records ), ordering medications and test ,psychoeducation and supportive psychotherapy and care coordination,as well as  documenting clinical information in electronic health record. This note was generated in part or whole with voice recognition software. Voice recognition is usually quite accurate but there are transcription errors that can and very often do occur. I apologize for any typographical errors that were not detected and corrected.       [place], MD 06/07/2020, 8:48 PM

## 2020-07-01 ENCOUNTER — Other Ambulatory Visit: Payer: Self-pay | Admitting: Psychiatry

## 2020-07-01 DIAGNOSIS — F3181 Bipolar II disorder: Secondary | ICD-10-CM

## 2020-07-06 ENCOUNTER — Other Ambulatory Visit: Payer: Self-pay

## 2020-07-06 ENCOUNTER — Ambulatory Visit (INDEPENDENT_AMBULATORY_CARE_PROVIDER_SITE_OTHER): Payer: PRIVATE HEALTH INSURANCE | Admitting: Licensed Clinical Social Worker

## 2020-07-06 DIAGNOSIS — F3176 Bipolar disorder, in full remission, most recent episode depressed: Secondary | ICD-10-CM | POA: Diagnosis not present

## 2020-07-06 NOTE — Progress Notes (Signed)
Virtual Visit via Video Note  I connected with Darrell Kennedy on 07/06/20 at  8:00 AM EDT by a video enabled telemedicine application and verified that I am speaking with the correct person using two identifiers.  Location: Patient: work Provider: [facility]   I discussed the limitations of evaluation and management by telemedicine and the availability of in person appointments. The patient expressed understanding and agreed to proceed.  I discussed the assessment and treatment plan with the patient. The patient was provided an opportunity to ask questions and all were answered. The patient agreed with the plan and demonstrated an understanding of the instructions.   The patient was advised to call back or seek an in-person evaluation if the symptoms worsen or if the condition fails to improve as anticipated.  I provided 60 minutes of non-face-to-face time during this encounter.   Darrell Kennedy R Darrell Meath, LCSW    THERAPIST PROGRESS NOTE  Session Time: 8-9a  Participation Level: Active  Behavioral Response: Neat and Well GroomedAlertEuthymic  Type of Therapy: Individual Therapy  Treatment Goals addressed: Anxiety and Coping  Interventions: Other: trauma-focused   Summary: Darrell Kennedy is a 40 y.o. male who presents with improving symptoms related to bipolar disorder diagnosis. Pt reports that his mood has been stable and that he is getting good quality and quantity of sleep.  Allowed pt to explore and express thoughts and feelings about recent personal growth and progress. Pt is currently happy with job, happy with social life, and is enjoying time with his puppy. Pt reports that with war coverage on the news and with having both [place] and [place] employees at his workplace, pt is finding the visuals on news triggering. Discussed importance of setting limits with watching the news--opt for getting details online, which could be less triggering.  Continued recommendations are as  follows: self care behaviors, positive social engagements, focusing on overall work/home/life balance, and focusing on positive physical and emotional wellness.   Suicidal/Homicidal: No  Therapist Response: Darrell Kennedy is   Plan: Return again in 8 weeks.  Diagnosis: Axis I: Bipolar, Depressed    Axis II: No diagnosis    Ernest Haber Darrell Adler, LCSW 07/06/2020

## 2020-07-30 ENCOUNTER — Other Ambulatory Visit: Payer: Self-pay | Admitting: Psychiatry

## 2020-07-30 DIAGNOSIS — F3181 Bipolar II disorder: Secondary | ICD-10-CM

## 2020-08-10 ENCOUNTER — Telehealth: Payer: Self-pay | Admitting: Psychiatry

## 2020-08-14 ENCOUNTER — Other Ambulatory Visit: Payer: Self-pay | Admitting: Psychiatry

## 2020-08-14 DIAGNOSIS — F3181 Bipolar II disorder: Secondary | ICD-10-CM

## 2020-08-27 ENCOUNTER — Other Ambulatory Visit: Payer: Self-pay | Admitting: Psychiatry

## 2020-08-27 DIAGNOSIS — F3181 Bipolar II disorder: Secondary | ICD-10-CM

## 2020-09-02 ENCOUNTER — Ambulatory Visit: Payer: PRIVATE HEALTH INSURANCE | Admitting: Licensed Clinical Social Worker

## 2020-09-09 ENCOUNTER — Encounter: Payer: Self-pay | Admitting: *Deleted

## 2020-09-09 ENCOUNTER — Telehealth: Payer: Self-pay | Admitting: *Deleted

## 2020-09-09 NOTE — Telephone Encounter (Signed)
Pt returned call. Reports he is taking the day as a mental health day and working from home. He denies any Covid sx. He is also taking off tomorrow and Tuesday after the holiday weekend. Denies any needs or concerns. Closing encounter.

## 2020-09-09 NOTE — Telephone Encounter (Signed)
Reviewed RN Rolly Salter note patient taking mental health vacation days no covid testing indicated at this time HR notified.

## 2020-09-09 NOTE — Telephone Encounter (Signed)
Clinic notified by HR that pt is working from home today as he feels "run down". Called pt to discuss. No answer. LVMRCB.

## 2020-09-15 ENCOUNTER — Telehealth (INDEPENDENT_AMBULATORY_CARE_PROVIDER_SITE_OTHER): Payer: PRIVATE HEALTH INSURANCE | Admitting: Psychiatry

## 2020-09-15 ENCOUNTER — Encounter: Payer: Self-pay | Admitting: Psychiatry

## 2020-09-15 ENCOUNTER — Other Ambulatory Visit: Payer: Self-pay

## 2020-09-15 DIAGNOSIS — F3176 Bipolar disorder, in full remission, most recent episode depressed: Secondary | ICD-10-CM

## 2020-09-15 DIAGNOSIS — F5105 Insomnia due to other mental disorder: Secondary | ICD-10-CM | POA: Diagnosis not present

## 2020-09-15 MED ORDER — LAMOTRIGINE 25 MG PO TABS: ORAL_TABLET | ORAL | 0 refills | Status: DC

## 2020-09-15 NOTE — Progress Notes (Signed)
Virtual Visit via Video Note  I connected with Darrell Kennedy on 09/15/20 at  2:30 PM EDT by a video enabled telemedicine application and verified that I am speaking with the correct person using two identifiers.  Location Provider Location : Office Patient Location : Work  Participants: Patient , Provider    I discussed the limitations of evaluation and management by telemedicine and the availability of in person appointments. The patient expressed understanding and agreed to proceed.   I discussed the assessment and treatment plan with the patient. The patient was provided an opportunity to ask questions and all were answered. The patient agreed with the plan and demonstrated an understanding of the instructions.   The patient was advised to call back or seek an in-person evaluation if the symptoms worsen or if the condition fails to improve as anticipated.   BH MD OP Progress Note  09/15/2020 2:54 PM Darrell Kennedy  MRN:  [id number]  Chief Complaint:  Chief Complaint    Follow-up; Depression     HPI: Darrell Kennedy is a 40 year old Caucasian male, lives in [place], has a history of bipolar disorder, insomnia, hyperlipidemia, HIV-positive was evaluated by telemedicine today.  Patient today reports he is currently doing well with regards to his mood.  Denies any mood swings.  Reports anxiety as manageable.  Patient reports sleep is good.  He takes the Ambien almost every night.  Denies any side effects.  Patient reports he is compliant on his medications and has not had any concerns.  He reports he likes his new job and is currently doing well.  Patient denies any suicidality, homicidality or perceptual disturbances.  Patient reports he continues to be in psychotherapy sessions however he follows up with his therapist every other month now since he is doing well.  Patient denies any other concerns today.  Visit Diagnosis:    ICD-10-CM   1. Bipolar disorder, in  full remission, most recent episode depressed (HCC)  F31.76 lamoTRIgine (LAMICTAL) 25 MG tablet  2. Insomnia due to mental condition  F51.05     Past Psychiatric History: I have reviewed past psychiatric history from progress note on 02/06/2019.  Past trials of BuSpar, Seroquel  Past Medical History:  Past Medical History:  Diagnosis Date  . Anxiety   . Depression   . Heart murmur   . HIV (human immunodeficiency virus infection) (HCC)   . HLD (hyperlipidemia)   . Stroke [facility])     Past Surgical History:  Procedure Laterality Date  . TEE WITHOUT CARDIOVERSION N/A 03/05/2019   Procedure: TRANSESOPHAGEAL ECHOCARDIOGRAM (TEE);  Surgeon: Lamar Blinks, MD;  Location: [facility] ORS;  Service: Cardiovascular;  Laterality: N/A;    Family Psychiatric History: I have reviewed family psychiatric history from progress note on 02/06/2019  Family History:  Family History  Problem Relation Age of Onset  . Hypertension Father   . Diabetes Father   . Kidney disease Father   . Depression Father   . Hyperlipidemia Father   . Alcohol abuse Brother   . Heart disease Paternal Grandmother   . Drug abuse Cousin     Social History: Reviewed social history from progress note on 02/06/2019 Social History   Socioeconomic History  . Marital status: Single    Spouse name: Not on file  . Number of children: 0  . Years of education: Not on file  . Highest education level: Master's degree (e.g., MA, MS, MEng, MEd, MSW, MBA)  Occupational History  .  Occupation: Consulting civil engineer  Tobacco Use  . Smoking status: Never Smoker  . Smokeless tobacco: Never Used  . Tobacco comment: None  Vaping Use  . Vaping Use: Never used  Substance and Sexual Activity  . Alcohol use: Yes    Alcohol/week: 0.0 standard drinks    Comment: social maybe once a week  . Drug use: Never  . Sexual activity: Yes    Birth control/protection: Condom  Other Topics Concern  . Not on file  Social History Narrative  . Not on file    Social Determinants of Health   Financial Resource Strain: Low Risk   . Difficulty of Paying Living Expenses: Not hard at all  Food Insecurity: No Food Insecurity  . Worried About Programme researcher, broadcasting/film/video in the Last Year: Never true  . Ran Out of Food in the Last Year: Never true  Transportation Needs: No Transportation Needs  . Lack of Transportation (Medical): No  . Lack of Transportation (Non-Medical): No  Physical Activity: Sufficiently Active  . Days of Exercise per Week: 5 days  . Minutes of Exercise per Session: 50 min  Stress: No Stress Concern Present  . Feeling of Stress : Not at all  Social Connections: Socially Isolated  . Frequency of Communication with Friends and Family: More than three times a week  . Frequency of Social Gatherings with Friends and Family: Once a week  . Attends Religious Services: Never  . Active Member of Clubs or Organizations: No  . Attends Banker Meetings: Never  . Marital Status: Never married    Allergies:  Allergies  Allergen Reactions  . Amoxicillin Rash    Metabolic Disorder Labs: Lab Results  Component Value Date   HGBA1C 5.6 02/12/2019   MPG 114.02 02/12/2019   No results found for: PROLACTIN Lab Results  Component Value Date   CHOL 161 09/23/2019   TRIG 70 09/23/2019   HDL 58 09/23/2019   CHOLHDL 2.8 09/23/2019   VLDL 14 09/23/2019   LDLCALC 89 09/23/2019   LDLCALC 78 02/12/2019   Lab Results  Component Value Date   TSH 1.78 02/12/2019    Therapeutic Level Labs: No results found for: LITHIUM No results found for: VALPROATE No components found for:  CBMZ  Current Medications: Current Outpatient Medications  Medication Sig Dispense Refill  . aspirin (ASPIRIN CHILDRENS) 81 MG chewable tablet Chew 1 tablet (81 mg total) by mouth daily. 120 tablet 0  . atorvastatin (LIPITOR) 20 MG tablet Take 1 tablet (20 mg total) by mouth daily. 90 tablet 3  . bictegravir-emtricitabine-tenofovir AF (BIKTARVY)  50-200-25 MG TABS tablet Take 1 tablet by mouth daily. 30 tablet 4  . chlorhexidine (PERIDEX) 0.12 % solution SMARTSIG:By Mouth    . clindamycin (CLEOCIN) 150 MG capsule Take 150 mg by mouth 4 (four) times daily.    Marland Kitchen escitalopram (LEXAPRO) 10 MG tablet TAKE 1 TABLET(10 MG) BY MOUTH DAILY 90 tablet 0  . ketoconazole (NIZORAL) 2 % shampoo Apply topically.    . lamoTRIgine (LAMICTAL) 200 MG tablet TAKE 1 TABLET(200 MG) BY MOUTH DAILY 90 tablet 0  . lamoTRIgine (LAMICTAL) 25 MG tablet TAKE 1 TABLET(25 MG) BY MOUTH TWICE DAILY. START TAKING WITH 200 MG DAILY 180 tablet 0  . Multiple Vitamin (MULTIVITAMIN WITH MINERALS) TABS tablet Take 1 tablet by mouth daily.    Marland Kitchen oxyCODONE (OXY IR/ROXICODONE) 5 MG immediate release tablet Take by mouth.    . OXYCONTIN 10 MG 12 hr tablet Take 10 mg by mouth  2 (two) times daily.    Marland Kitchen zolpidem (AMBIEN CR) 12.5 MG CR tablet TAKE 1 TABLET(12.5 MG) BY MOUTH AT BEDTIME AS NEEDED FOR SLEEP 90 tablet 0   No current facility-administered medications for this visit.     Musculoskeletal: Strength & Muscle Tone: UTA Gait & Station: UTA Patient leans: N/A  Psychiatric Specialty Exam: Review of Systems  Psychiatric/Behavioral: Negative for agitation, behavioral problems, confusion, decreased concentration, dysphoric mood, hallucinations, self-injury, sleep disturbance and suicidal ideas. The patient is not nervous/anxious and is not hyperactive.   All other systems reviewed and are negative.   There were no vitals taken for this visit.There is no height or weight on file to calculate BMI.  General Appearance: Casual  Eye Contact:  Fair  Speech:  Clear and Coherent  Volume:  Normal  Mood:  Euthymic  Affect:  Congruent  Thought Process:  Goal Directed and Descriptions of Associations: Intact  Orientation:  Full (Time, Place, and Person)  Thought Content: Logical   Suicidal Thoughts:  No  Homicidal Thoughts:  No  Memory:  Immediate;   Fair Recent;   Fair Remote;    Fair  Judgement:  Fair  Insight:  Fair  Psychomotor Activity:  Normal  Concentration:  Concentration: Fair and Attention Span: Fair  Recall:  [employer] of Knowledge: Fair  Language: Fair  Akathisia:  No  Handed:  Right  AIMS (if indicated): UTA  Assets:  Communication Skills Desire for Improvement Housing Resilience Social Support Talents/Skills Transportation Vocational/Educational  ADL's:  Intact  Cognition: WNL  Sleep:  Fair   Screenings: AUDIT   Flowsheet Row Office Visit from 10/07/2018 in [facility]  Alcohol Use Disorder Identification Test Final Score (AUDIT) 3    GAD-7   Flowsheet Row Video Visit from 09/15/2020 in [facility]  Total GAD-7 Score 2    PHQ2-9   Flowsheet Row Video Visit from 09/15/2020 in [facility] Video Visit from 06/07/2020 in [facility] Office Visit from 03/30/2020 in [facility] Office Visit from 03/27/2019 in [facility] Office Visit from 02/20/2019 in [facility]  PHQ-2 Total Score 0 0 0 0 2  PHQ-9 Total Score -- -- 0 2 10    Flowsheet Row Video Visit from 09/15/2020 in [facility] Video Visit from 06/07/2020 in [facility]  C-SSRS RISK CATEGORY No Risk No Risk       Assessment and Plan: TARVIS BLOSSOM is a 40 year old Caucasian male, employed, lives in [place], has a history of bipolar disorder, hyperlipidemia, HIV-positive was evaluated by telemedicine today.  Patient is biologically predisposed given his family history.  Patient is currently stable.  Plan as noted below.  Plan Bipolar disorder in remission Lamictal 250 mg p.o. daily Lexapro 10 mg p.o. daily  Insomnia-stable Ambien CR 12.5 mg p.o. nightly  Continue CBT.  Follow-up in clinic in person in 3 months or sooner if needed.  This note was generated  in part or whole with voice recognition software. Voice recognition is usually quite accurate but there are transcription errors that can and very often do occur. I apologize for any typographical errors that were not detected and corrected.      Darrell Longs, MD 09/15/2020, 2:54 PM

## 2020-09-16 ENCOUNTER — Ambulatory Visit: Payer: PRIVATE HEALTH INSURANCE | Admitting: Infectious Diseases

## 2020-09-21 ENCOUNTER — Ambulatory Visit: Payer: PRIVATE HEALTH INSURANCE | Attending: Infectious Diseases | Admitting: Infectious Diseases

## 2020-09-21 ENCOUNTER — Other Ambulatory Visit
Admission: RE | Admit: 2020-09-21 | Discharge: 2020-09-21 | Disposition: A | Discharge status: Home / Self Care | Payer: PRIVATE HEALTH INSURANCE | Source: Ambulatory Visit | Attending: Infectious Diseases | Admitting: Infectious Diseases

## 2020-09-21 ENCOUNTER — Encounter: Payer: Self-pay | Admitting: Infectious Diseases

## 2020-09-21 ENCOUNTER — Other Ambulatory Visit: Payer: Self-pay

## 2020-09-21 VITALS — BP 105/69 | HR 57 | Resp 16 | Ht 66.54 in | Wt 173.0 lb

## 2020-09-21 DIAGNOSIS — B20 Human immunodeficiency virus [HIV] disease: Secondary | ICD-10-CM | POA: Insufficient documentation

## 2020-09-21 DIAGNOSIS — Z8673 Personal history of transient ischemic attack (TIA), and cerebral infarction without residual deficits: Secondary | ICD-10-CM | POA: Diagnosis not present

## 2020-09-21 DIAGNOSIS — Z23 Encounter for immunization: Secondary | ICD-10-CM | POA: Diagnosis not present

## 2020-09-21 DIAGNOSIS — F319 Bipolar disorder, unspecified: Secondary | ICD-10-CM | POA: Insufficient documentation

## 2020-09-21 DIAGNOSIS — R768 Other specified abnormal immunological findings in serum: Secondary | ICD-10-CM | POA: Insufficient documentation

## 2020-09-21 DIAGNOSIS — Z79899 Other long term (current) drug therapy: Secondary | ICD-10-CM | POA: Insufficient documentation

## 2020-09-21 DIAGNOSIS — Z7982 Long term (current) use of aspirin: Secondary | ICD-10-CM | POA: Diagnosis not present

## 2020-09-21 DIAGNOSIS — D6851 Activated protein C resistance: Secondary | ICD-10-CM | POA: Insufficient documentation

## 2020-09-21 LAB — COMPREHENSIVE METABOLIC PANEL
ALT: 21 U/L (ref 0–44)
AST: 22 U/L (ref 15–41)
Albumin: 4.3 g/dL (ref 3.5–5.0)
Alkaline Phosphatase: 56 U/L (ref 38–126)
Anion gap: 10 (ref 5–15)
BUN: 15 mg/dL (ref 6–20)
CO2: 25 mmol/L (ref 22–32)
Calcium: 9.1 mg/dL (ref 8.9–10.3)
Chloride: 102 mmol/L (ref 98–111)
Creatinine, Ser: 1.39 mg/dL — ABNORMAL HIGH (ref 0.61–1.24)
GFR, Estimated: >60 mL/min (ref >60)
Glucose, Bld: 103 mg/dL — ABNORMAL HIGH (ref 70–99)
Potassium: 3.9 mmol/L (ref 3.5–5.1)
Sodium: 137 mmol/L (ref 135–145)
Total Bilirubin: 0.8 mg/dL (ref 0.3–1.2)
Total Protein: 7.1 g/dL (ref 6.5–8.1)

## 2020-09-21 MED ORDER — BIKTARVY 50-200-25 MG PO TABS: 1.0000 | ORAL_TABLET | Freq: Every day | ORAL | 4 refills | Status: DC

## 2020-09-21 NOTE — Progress Notes (Signed)
NAME: Darrell Kennedy  DOB: 12-Aug-1980  MRN: [id number]  Date/Time: 09/21/2020 9:48 AM  Subjective:  Follow up HIV visit ? Darrell Kennedy is a 40 y.o. with a history of HIV Is here for follow up. Last seen in  Dec 2021 On Biktarvy and 100% adherent  Doing well No complaints No side effects from med Followed by psychiatrist Darrell Kennedy for Bipolar disorder which is well controlled. 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 Was transient. underwent work up with Neg MRI, ECHO, TEE which did not show any intracardiac shunting. He saw neurologist Darrell Kennedy at the [facility] clinic on 02/17/19  and he sent hypercoagulable panel ESR, CRP - Factor V Leiden mutation - Antithrombin III functional assay - MTHFR gene mutation - Protein C and S activity (functional assay) - Homocysteine - Lupus anticoagulant comprehensive - Antiphospholipid syndrome (includes aPTT, PT, INR, Thrombin time, Dilute Viper Venom Time, Hexagonal phase phospholipid, Anticoardiolipin antibody IgG and IgM, Beta - 2 Glycoprotein IgG and IgM,   Anticardiolipin IgM  ab was elevated at 13 ( 0-12 ) N and homocysteine was 16( 0-14.5) Started on methylfolate  and aspirin . seen heme once Darrell Kennedy .    HIV history HIV diagnosed NOV 2015 in  [place] when he had flu like symptoms and knew he had acute HIV and went and got tested. Nadir Cd4 was > 700  He saw Darrell Kennedy and was started on isentress and truvada. He later changed to [facility] Since end of 2017 he was off treatment because of lack of insurance. He moved to [place] [facility] in 2018 and did not engage in care because of lack on insurance until Jan 2020  Nadir Cd4 484 VL >200,000 OI none HAARt history truvada isentress Genvoya Acquired  thru-sex Genotype done in [place] ? Past Medical History:  Diagnosis Date  . Anxiety   . Depression   . Heart murmur   . HIV (human immunodeficiency virus infection) ([place])   . HLD (hyperlipidemia)   . Stroke [facility])     Surgical History -none  FH Father- diabetes, HTN Mother-adrenal tumor removed  ?SH Non smoker No illicit drug use Occasional alcohol Has traveled to [place] in 2013 Lives with his parents  ? Current Outpatient Medications  Medication Sig Dispense Refill  . aspirin (ASPIRIN CHILDRENS) 81 MG chewable tablet Chew 1 tablet (81 mg total) by mouth daily. 120 tablet 0  . atorvastatin (LIPITOR) 20 MG tablet Take 1 tablet (20 mg total) by mouth daily. 90 tablet 3  . bictegravir-emtricitabine-tenofovir AF (BIKTARVY) 50-200-25 MG TABS tablet Take 1 tablet by mouth daily. 30 tablet 4  . chlorhexidine (PERIDEX) 0.12 % solution SMARTSIG:By Mouth    . clindamycin (CLEOCIN) 150 MG capsule Take 150 mg by mouth 4 (four) times daily.    Marland Kitchen escitalopram (LEXAPRO) 10 MG tablet TAKE 1 TABLET(10 MG) BY MOUTH DAILY 90 tablet 0  . ketoconazole (NIZORAL) 2 % shampoo Apply topically.    . lamoTRIgine (LAMICTAL) 200 MG tablet TAKE 1 TABLET(200 MG) BY MOUTH DAILY 90 tablet 0  . lamoTRIgine (LAMICTAL) 25 MG tablet TAKE 1 TABLET(25 MG) BY MOUTH TWICE DAILY. START TAKING WITH 200 MG DAILY 180 tablet 0  . Multiple Vitamin (MULTIVITAMIN  WITH MINERALS) TABS tablet Take 1 tablet by mouth daily.    Marland Kitchen zolpidem (AMBIEN CR) 12.5 MG CR tablet TAKE 1 TABLET(12.5 MG) BY MOUTH AT BEDTIME AS NEEDED FOR SLEEP 90 tablet 0   No current facility-administered medications for this visit.    REVIEW OF SYSTEMS:  Const: negative fever, negative chills, negative weight loss Eyes: negative diplopia or visual changes, negative eye pain ENT: negative coryza, negative sore throat Resp: negative cough, hemoptysis, dyspnea Cards: negative for chest pain, palpitations, lower extremity edema GU: negative for frequency,  dysuria and hematuria Skin: negative for rash and pruritus Heme: negative for easy bruising and gum/nose bleeding MS: negative for myalgias, arthralgias, back pain and muscle weakness Neurolo:negative for headaches, dizziness, vertigo, memory problems    Objective:  VITALS:  BP 105/69   Pulse (!) 57   Resp 16   Ht 5' 6.54" (1.69 m)   Wt 173 lb (78.5 kg)   SpO2 97%   BMI 27.47 kg/m  PHYSICAL EXAM:  General: Alert, cooperative, no distress, appears stated age.  Head: Normocephalic, without obvious abnormality, atraumatic. Eyes: Conjunctivae clear, anicteric sclerae. Pupils are equal Nose: Nares normal. No drainage or sinus tenderness. Throat: Lips, mucosa, and tongue normal. No Thrush Neck: Supple, symmetrical, no adenopathy, thyroid: non tender no carotid bruit and no JVD. Back: No CVA tenderness. Lungs: Clear to auscultation bilaterally. No Wheezing or Rhonchi. No rales. Heart: Regular rate and rhythm, no murmur, rub or gallop. Abdomen: Soft, non-tender,not distended. Bowel sounds normal. No masses Extremities: Extremities normal, atraumatic, no cyanosis. No edema. No clubbing Skin: No rashes or lesions. Not Jaundiced Lymph: Cervical, supraclavicular normal. Neurologic: Grossly non-focal Pertinent Labs  IMAGING RESULTS: Health maintenance Vaccination  Vaccine Date last given comment  Influenza 01/24/19   Hepatitis B 05/15/2014,2/29 &11/24/14 [place]  Hepatitis A 05/15/14 &06/15/14   Prevnar-PCV-13 04/02/2014   Pneumovac-PPSV-23 09/10/18   TdaP 05/15/14   HPV 09/23/19, 10/21/19 and 03/18/20   Shingrix ( zoster vaccine)    Corona virus vaccine X 3 08/05/19, 5/21, 02/06/20    ______________________  Labs Lab Result  Date comment  HIV VL <20 03/18/20   CD4 815(31%) 03/18/20   Genotype Negative 04/09/2014 [facility] [place]  [id number] NEG 04/02/2014   HIV antibody Reactive 05/07/18   RPR 1:1 ( but TAP NEG NR 05/07/18  09/23/19 False positive  Quantiferon Gold NR 03/18/20   Hep C ab NR  05/07/18   Hepatitis B-ab,ag,c Ab-positive 05/07/18 vaccinated  Hepatitis A-IgM, IgG /T     Lipid 161/58/89/70 09/23/19   GC/CHL NR 09/23/19   PAP LGSIL 09/23/19 Will repeat in 6 months  HB,PLT,Cr, LFT 14/284/1.34/N      Preventive  Procedure Result  Date comment  colonoscopy     Mammogram     Dental exam     Opthal       Impression/Recommendation ? HIV- on Biktarvy- doing well- 100% adherent . Last Vl < 20 and Cd4 >800 from Dec 2021  TIA like episode-none in the past year  investigated and found to have borderline high Anticardiolipin IgM and homocysteine- on aspirin and methyl folate Followed by heme onc who does not think the labs are significant It was thought to be due to his medication ( antidepressant/  Bipolar  disorder- followed by Psychiatrist- on lamictal $RemoveBef'250mg'[id number]$  QD, seroquel being tapered- trazadone for sleep  Getting dental work tomorrow- getting the wisdom teeth removed  anal PAP -LGSIL- will repeat Dec 2021 HPV vaccine has received 3doses-  Follow up  6 months ( will do annual visit and all screens) ___________________________________________________ Discussed with patient in detail

## 2020-09-21 NOTE — Patient Instructions (Addendum)
You are here for your routine visit- today will get labs- continue Biktarvy. appt in 6 months

## 2020-09-22 LAB — T-HELPER CELLS CD4/CD8 %
% CD 4 Pos. Lymph.: 31.3 % (ref 30.8–58.5)
Absolute CD 4 Helper: 845 /uL (ref 359–1519)
Basophils Absolute: 0.1 10*3/uL (ref 0.0–0.2)
Basos: 1 %
CD3+CD4+ Cells/CD3+CD8+ Cells Bld: 0.65 — ABNORMAL LOW (ref 0.92–3.72)
CD3+CD8+ Cells # Bld: 1293 /uL — ABNORMAL HIGH (ref 109–897)
CD3+CD8+ Cells NFr Bld: 47.9 % — ABNORMAL HIGH (ref 12.0–35.5)
EOS (ABSOLUTE): 0.2 10*3/uL (ref 0.0–0.4)
Eos: 4 %
Hematocrit: 41 % (ref 37.5–51.0)
Hemoglobin: 13.8 g/dL (ref 13.0–17.7)
Immature Grans (Abs): 0 10*3/uL (ref 0.0–0.1)
Immature Granulocytes: 0 %
Lymphocytes Absolute: 2.7 10*3/uL (ref 0.7–3.1)
Lymphs: 46 %
MCH: 29.9 pg (ref 26.6–33.0)
MCHC: 33.7 g/dL (ref 31.5–35.7)
MCV: 89 fL (ref 79–97)
Monocytes Absolute: 0.5 10*3/uL (ref 0.1–0.9)
Monocytes: 8 %
Neutrophils Absolute: 2.4 10*3/uL (ref 1.4–7.0)
Neutrophils: 41 %
Platelets: 336 10*3/uL (ref 150–450)
RBC: 4.62 x10E6/uL (ref 4.14–5.80)
RDW: 12.2 % (ref 11.6–15.4)
WBC: 5.9 10*3/uL (ref 3.4–10.8)

## 2020-09-22 LAB — HIV-1 RNA QUANT-NO REFLEX-BLD
HIV 1 RNA Quant: 30 copies/mL
LOG10 HIV-1 RNA: 1.477 log10copy/mL

## 2020-09-25 NOTE — Telephone Encounter (Signed)
Patient returned call he ended up working from home the day prior to holiday weekend and feeling well returned as expected onsite.  No further questions or concerns.  A&Ox3 spoke full sentences without difficulty no cough/congestion or throat clearing.

## 2020-10-04 ENCOUNTER — Other Ambulatory Visit: Payer: Self-pay | Admitting: Psychiatry

## 2020-10-04 DIAGNOSIS — F5105 Insomnia due to other mental disorder: Secondary | ICD-10-CM

## 2020-10-21 ENCOUNTER — Other Ambulatory Visit: Payer: Self-pay | Admitting: Psychiatry

## 2020-10-21 ENCOUNTER — Other Ambulatory Visit: Payer: Self-pay | Admitting: Child and Adolescent Psychiatry

## 2020-10-21 DIAGNOSIS — F3181 Bipolar II disorder: Secondary | ICD-10-CM

## 2020-10-21 DIAGNOSIS — F3176 Bipolar disorder, in full remission, most recent episode depressed: Secondary | ICD-10-CM

## 2020-10-21 NOTE — Telephone Encounter (Signed)
Dr. Vanetta Shawl is covering

## 2020-10-26 ENCOUNTER — Telehealth: Payer: Self-pay

## 2020-10-26 DIAGNOSIS — F3181 Bipolar II disorder: Secondary | ICD-10-CM

## 2020-10-26 MED ORDER — LAMOTRIGINE 200 MG PO TABS: ORAL_TABLET | ORAL | 0 refills | Status: DC

## 2020-10-26 NOTE — Telephone Encounter (Signed)
I have sent Lamictal to pharmacy. °

## 2020-10-26 NOTE — Telephone Encounter (Signed)
pt called left message that he needs refills on the lamotrigine

## 2020-11-05 ENCOUNTER — Ambulatory Visit: Payer: Self-pay | Admitting: *Deleted

## 2020-11-05 ENCOUNTER — Other Ambulatory Visit: Payer: Self-pay

## 2020-11-05 VITALS — BP 110/77 | HR 58 | Ht 66.5 in | Wt 170.0 lb

## 2020-11-05 DIAGNOSIS — D649 Anemia, unspecified: Secondary | ICD-10-CM

## 2020-11-05 DIAGNOSIS — E7800 Pure hypercholesterolemia, unspecified: Secondary | ICD-10-CM

## 2020-11-05 DIAGNOSIS — Z0000 Encounter for general adult medical examination without abnormal findings: Secondary | ICD-10-CM

## 2020-11-05 NOTE — Progress Notes (Signed)
[employer] insurance premium discount evaluation: Labs Drawn. Replacements ROI form signed. Tobacco Free Attestation form signed.  Forms placed in paper chart.   Pt previously on a statin but changed insurances/jobs and this one somehow got dropped. Has not been on it since Jan 2022.

## 2020-11-06 ENCOUNTER — Encounter: Payer: Self-pay | Admitting: Registered Nurse

## 2020-11-06 DIAGNOSIS — E7800 Pure hypercholesterolemia, unspecified: Secondary | ICD-10-CM

## 2020-11-06 LAB — CMP12+LP+TP+TSH+6AC+CBC/D/PLT
ALT: 20 IU/L (ref 0–44)
AST: 25 IU/L (ref 0–40)
Albumin/Globulin Ratio: 2 (ref 1.2–2.2)
Albumin: 4.7 g/dL (ref 4.0–5.0)
Alkaline Phosphatase: 78 IU/L (ref 44–121)
BUN/Creatinine Ratio: 10 (ref 9–20)
BUN: 14 mg/dL (ref 6–20)
Basophils Absolute: 0.1 10*3/uL (ref 0.0–0.2)
Basos: 1 %
Bilirubin Total: 0.4 mg/dL (ref 0.0–1.2)
Calcium: 9 mg/dL (ref 8.7–10.2)
Chloride: 101 mmol/L (ref 96–106)
Chol/HDL Ratio: 4.7 ratio (ref 0.0–5.0)
Cholesterol, Total: 232 mg/dL — ABNORMAL HIGH (ref 100–199)
Creatinine, Ser: 1.45 mg/dL — ABNORMAL HIGH (ref 0.76–1.27)
EOS (ABSOLUTE): 0.2 10*3/uL (ref 0.0–0.4)
Eos: 2 %
Estimated CHD Risk: 1 times avg. (ref 0.0–1.0)
Free Thyroxine Index: 1.7 (ref 1.2–4.9)
GGT: 13 IU/L (ref 0–65)
Globulin, Total: 2.4 g/dL (ref 1.5–4.5)
Glucose: 90 mg/dL (ref 65–99)
HDL: 49 mg/dL (ref >39)
Hematocrit: 37.5 % (ref 37.5–51.0)
Hemoglobin: 12.7 g/dL — ABNORMAL LOW (ref 13.0–17.7)
Immature Grans (Abs): 0 10*3/uL (ref 0.0–0.1)
Immature Granulocytes: 0 %
Iron: 63 ug/dL (ref 38–169)
LDH: 238 IU/L — ABNORMAL HIGH (ref 121–224)
LDL Chol Calc (NIH): 168 mg/dL — ABNORMAL HIGH (ref 0–99)
Lymphocytes Absolute: 2.7 10*3/uL (ref 0.7–3.1)
Lymphs: 39 %
MCH: 31.5 pg (ref 26.6–33.0)
MCHC: 33.9 g/dL (ref 31.5–35.7)
MCV: 93 fL (ref 79–97)
Monocytes Absolute: 0.5 10*3/uL (ref 0.1–0.9)
Monocytes: 7 %
Neutrophils Absolute: 3.6 10*3/uL (ref 1.4–7.0)
Neutrophils: 51 %
Phosphorus: 2.6 mg/dL — ABNORMAL LOW (ref 2.8–4.1)
Platelets: 319 10*3/uL (ref 150–450)
Potassium: 4.4 mmol/L (ref 3.5–5.2)
RBC: 4.03 x10E6/uL — ABNORMAL LOW (ref 4.14–5.80)
RDW: 11.8 % (ref 11.6–15.4)
Sodium: 140 mmol/L (ref 134–144)
T3 Uptake Ratio: 25 % (ref 24–39)
T4, Total: 6.9 ug/dL (ref 4.5–12.0)
TSH: 0.965 u[IU]/mL (ref 0.450–4.500)
Total Protein: 7.1 g/dL (ref 6.0–8.5)
Triglycerides: 84 mg/dL (ref 0–149)
Uric Acid: 6.3 mg/dL (ref 3.8–8.4)
VLDL Cholesterol Cal: 15 mg/dL (ref 5–40)
WBC: 7 10*3/uL (ref 3.4–10.8)
eGFR: 63 mL/min/{1.73_m2} (ref >59)

## 2020-11-06 LAB — HGB A1C W/O EAG: Hgb A1c MFr Bld: 4.2 % — ABNORMAL LOW (ref 4.8–5.6)

## 2020-11-16 ENCOUNTER — Other Ambulatory Visit: Payer: Self-pay | Admitting: Psychiatry

## 2020-11-16 DIAGNOSIS — F3181 Bipolar II disorder: Secondary | ICD-10-CM

## 2020-11-16 MED ORDER — ATORVASTATIN CALCIUM 20 MG PO TABS: 20.0000 mg | ORAL_TABLET | Freq: Every day | ORAL | 0 refills | Status: DC

## 2020-11-30 NOTE — Addendum Note (Signed)
Addended by: Albina Billet A on: 11/30/2020 11:01 AM   Modules accepted: Orders

## 2020-11-30 NOTE — Progress Notes (Signed)
Appt for nonfasting labs made for 02/21/21 and emailed to pt. Reminder also given for new Rx prior to next fill.

## 2020-11-30 NOTE — Progress Notes (Signed)
Noted labs scheduled and reminder new PCM Rx needed for atorvastatin sent by RN Rolly Salter

## 2020-12-27 ENCOUNTER — Other Ambulatory Visit: Payer: Self-pay | Admitting: Psychiatry

## 2020-12-27 DIAGNOSIS — F3176 Bipolar disorder, in full remission, most recent episode depressed: Secondary | ICD-10-CM

## 2020-12-28 ENCOUNTER — Ambulatory Visit: Payer: PRIVATE HEALTH INSURANCE | Admitting: Psychiatry

## 2020-12-30 ENCOUNTER — Encounter: Payer: Self-pay | Admitting: Psychiatry

## 2020-12-30 ENCOUNTER — Other Ambulatory Visit: Payer: Self-pay

## 2020-12-30 ENCOUNTER — Telehealth (INDEPENDENT_AMBULATORY_CARE_PROVIDER_SITE_OTHER): Payer: No Typology Code available for payment source | Admitting: Psychiatry

## 2020-12-30 DIAGNOSIS — F5105 Insomnia due to other mental disorder: Secondary | ICD-10-CM | POA: Diagnosis not present

## 2020-12-30 DIAGNOSIS — F3176 Bipolar disorder, in full remission, most recent episode depressed: Secondary | ICD-10-CM | POA: Diagnosis not present

## 2020-12-30 DIAGNOSIS — R41840 Attention and concentration deficit: Secondary | ICD-10-CM | POA: Diagnosis not present

## 2020-12-30 MED ORDER — LAMOTRIGINE 200 MG PO TABS: ORAL_TABLET | ORAL | 0 refills | Status: DC

## 2020-12-30 NOTE — Progress Notes (Signed)
Virtual Visit via Video Note  I connected with Darrell Kennedy on 12/30/20 at  1:30 PM EDT by a video enabled telemedicine application and verified that I am speaking with the correct person using two identifiers.  Location Provider Location : [facility] Patient Location : Work  Participants: Patient , Provider   I discussed the limitations of evaluation and management by telemedicine and the availability of in person appointments. The patient expressed understanding and agreed to proceed.    I discussed the assessment and treatment plan with the patient. The patient was provided an opportunity to ask questions and all were answered. The patient agreed with the plan and demonstrated an understanding of the instructions.   The patient was advised to call back or seek an in-person evaluation if the symptoms worsen or if the condition fails to improve as anticipated.   BH MD OP Progress Note  12/30/2020 1:55 PM Darrell Kennedy  MRN:  [id number]  Chief Complaint:  Chief Complaint   Follow-up; Depression    HPI: Darrell Kennedy is a 40 year old Caucasian male, lives in [place], has a history of bipolar disorder, insomnia, hyperlipidemia, HIV-positive was evaluated by telemedicine today.  Patient today returns for a follow-up, reports he missed his appointment yesterday, apologized for the same.  Patient today reports overall his mood symptoms are currently good.  He did go through a manic episode around the time of his birthday in [birth year].  Patient reports around that time every year he feels hypomanic or manic and feels euphoric, does a lot of reckless things.  However this time he was able to manage it well and was able to restrict himself from doing anything reckless.  Patient reports it lasted for 3 or 4 weeks and then he felt depressed for a few days. But currently he is out of it and feels better.  Patient reports sleep is good.  Patient reports he struggles with attention, focus,  has problems concentrating when he is doing a project, procrastinates a lot, has lack of motivation, forgets appointments or obligations, is often very restless, fidgety, talkative in social situations, interrupts others when they are busy.  This has been going on since he was in elementary school.  Patient reports he remembers his teacher asking his parents when he was really young to hold him for 1 more year in elementary school even though he had gotten good grades.  This may have been because he was very hyperactive.  Patient reports he managed through school without much trouble and passed all his grades.  Patient however reports he is having trouble with his concentration although he enjoys his work.  Patient denies any suicidality, homicidality or perceptual disturbances.  Patient denies any other concerns today.  Visit Diagnosis:    ICD-10-CM   1. Bipolar disorder, in full remission, most recent episode depressed (HCC)  F31.76 lamoTRIgine (LAMICTAL) 200 MG tablet    2. Insomnia due to mental condition  F51.05    mood    3. Attention and concentration deficit  R41.840       Past Psychiatric History: I have reviewed past psychiatric history from progress note on 02/06/2019.  Past trials of BuSpar, Seroquel  Past Medical History:  Past Medical History:  Diagnosis Date   Anxiety    Depression    Heart murmur    HIV (human immunodeficiency virus infection) (HCC)    HLD (hyperlipidemia)    Stroke [facility])     Past Surgical History:  Procedure Laterality  Date   TEE WITHOUT CARDIOVERSION N/A 03/05/2019   Procedure: TRANSESOPHAGEAL ECHOCARDIOGRAM (TEE);  Surgeon: Lamar Blinks, MD;  Location: [facility] ORS;  Service: Cardiovascular;  Laterality: N/A;    Family Psychiatric History: Reviewed family psychiatric history from progress note on 02/06/2019  Family History:  Family History  Problem Relation Age of Onset   Hypertension Father    Diabetes Father    Kidney disease Father     Depression Father    Hyperlipidemia Father    Alcohol abuse Brother    Heart disease Paternal Grandmother    Drug abuse Cousin     Social History: Reviewed social history from progress note on 02/06/2019 Social History   Socioeconomic History   Marital status: Single    Spouse name: Not on file   Number of children: 0   Years of education: Not on file   Highest education level: Master's degree (e.g., MA, MS, MEng, MEd, MSW, MBA)  Occupational History   Occupation: student  Tobacco Use   Smoking status: Never   Smokeless tobacco: Never   Tobacco comments:    None  Vaping Use   Vaping Use: Never used  Substance and Sexual Activity   Alcohol use: Yes    Alcohol/week: 0.0 standard drinks    Comment: social maybe once a week   Drug use: Never   Sexual activity: Yes    Birth control/protection: Condom  Other Topics Concern   Not on file  Social History Narrative   Not on file   Social Determinants of Health   Financial Resource Strain: Low Risk    Difficulty of Paying Living Expenses: Not hard at all  Food Insecurity: No Food Insecurity   Worried About Programme researcher, broadcasting/film/video in the Last Year: Never true   Ran Out of Food in the Last Year: Never true  Transportation Needs: No Transportation Needs   Lack of Transportation (Medical): No   Lack of Transportation (Non-Medical): No  Physical Activity: Sufficiently Active   Days of Exercise per Week: 5 days   Minutes of Exercise per Session: 50 min  Stress: No Stress Concern Present   Feeling of Stress : Not at all  Social Connections: Socially Isolated   Frequency of Communication with Friends and Family: More than three times a week   Frequency of Social Gatherings with Friends and Family: Once a week   Attends Religious Services: Never   Database administrator or Organizations: No   Attends Banker Meetings: Never   Marital Status: Never married    Allergies:  Allergies  Allergen Reactions   Amoxicillin  Rash    Metabolic Disorder Labs: Lab Results  Component Value Date   HGBA1C 4.2 (L) 11/05/2020   MPG 114.02 02/12/2019   No results found for: PROLACTIN Lab Results  Component Value Date   CHOL 232 (H) 11/05/2020   TRIG 84 11/05/2020   HDL 49 11/05/2020   CHOLHDL 4.7 11/05/2020   VLDL 14 09/23/2019   LDLCALC 168 (H) 11/05/2020   LDLCALC 89 09/23/2019   Lab Results  Component Value Date   TSH 0.965 11/05/2020   TSH 1.78 02/12/2019    Therapeutic Level Labs: No results found for: LITHIUM No results found for: VALPROATE No components found for:  CBMZ  Current Medications: Current Outpatient Medications  Medication Sig Dispense Refill   aspirin (ASPIRIN CHILDRENS) 81 MG chewable tablet Chew 1 tablet (81 mg total) by mouth daily. 120 tablet 0   atorvastatin (LIPITOR)  20 MG tablet Take 1 tablet (20 mg total) by mouth daily. 90 tablet 0   bictegravir-emtricitabine-tenofovir AF (BIKTARVY) 50-200-25 MG TABS tablet Take 1 tablet by mouth daily. 30 tablet 4   escitalopram (LEXAPRO) 10 MG tablet TAKE 1 TABLET(10 MG) BY MOUTH DAILY 90 tablet 0   ketoconazole (NIZORAL) 2 % shampoo Apply topically.     lamoTRIgine (LAMICTAL) 200 MG tablet TAKE 1 TABLET(200 MG) BY MOUTH DAILY 90 tablet 0   lamoTRIgine (LAMICTAL) 25 MG tablet TAKE 1 TABLET(25 MG) BY MOUTH TWICE DAILY. START TAKING WITH 200 MG DAILY 180 tablet 0   Multiple Vitamin (MULTIVITAMIN WITH MINERALS) TABS tablet Take 1 tablet by mouth daily.     zolpidem (AMBIEN CR) 12.5 MG CR tablet TAKE 1 TABLET(12.5 MG) BY MOUTH AT BEDTIME AS NEEDED FOR SLEEP 90 tablet 0   No current facility-administered medications for this visit.     Musculoskeletal: Strength & Muscle Tone:  UTA Gait & Station: normal Patient leans: N/A  Psychiatric Specialty Exam: Review of Systems  Psychiatric/Behavioral:  Positive for decreased concentration.   All other systems reviewed and are negative.  There were no vitals taken for this visit.There is no  height or weight on file to calculate BMI.  General Appearance: Casual  Eye Contact:  Good  Speech:  Clear and Coherent  Volume:  Normal  Mood:  Euthymic  Affect:  Congruent  Thought Process:  Goal Directed and Descriptions of Associations: Intact  Orientation:  Full (Time, Place, and Person)  Thought Content: Logical   Suicidal Thoughts:  No  Homicidal Thoughts:  No  Memory:  Immediate;   Fair Recent;   Fair Remote;   Fair  Judgement:  Fair  Insight:  Fair  Psychomotor Activity:  Normal  Concentration:  Concentration: Fair and Attention Span: Fair  Recall:  Good  Fund of Knowledge: Good  Language: Fair  Akathisia:  No  Handed:  Right  AIMS (if indicated): done  Assets:  Communication Skills Desire for Improvement Financial Resources/Insurance Housing Resilience Social Support Talents/Skills Transportation Vocational/Educational  ADL's:  Intact  Cognition: WNL  Sleep:  Fair   Screenings: AUDIT    Flowsheet Row Office Visit from 10/07/2018 in [facility]  Alcohol Use Disorder Identification Test Final Score (AUDIT) 3      GAD-7    Flowsheet Row Video Visit from 09/15/2020 in [facility]  Total GAD-7 Score 2      PHQ2-9    Flowsheet Row Video Visit from 12/30/2020 in [facility] Video Visit from 09/15/2020 in [facility] Video Visit from 06/07/2020 in [facility] Office Visit from 03/30/2020 in [facility] Office Visit from 03/27/2019 in [facility]  PHQ-2 Total Score 0 0 0 0 0  PHQ-9 Total Score 5 -- -- 0 2      Flowsheet Row Video Visit from 09/15/2020 in [facility] Video Visit from 06/07/2020 in [facility]  C-SSRS RISK CATEGORY No Risk No Risk        Assessment and Plan: Darrell Kennedy is a 40 year old Caucasian male,  employed, lives in [place], has a history of bipolar disorder, hyperlipidemia, HIV-positive was evaluated by telemedicine today.  Patient reports mood symptoms as currently stable however does report concentration and attention problems, will benefit from ADHD testing.  Plan Bipolar disorder in remission Lamotrigine 250 mg p.o. daily Lexapro 10 mg p.o. daily  Insomnia-stable Ambien CR  12.5 mg p.o. nightly  Attention and concentration deficit-unstable Completed ASRS Scale -scored high on the same. Patient to be referred for ADHD testing.  Continue psychotherapy sessions.  Follow-up in clinic in 3 months or sooner if needed.  This note was generated in part or whole with voice recognition software. Voice recognition is usually quite accurate but there are transcription errors that can and very often do occur. I apologize for any typographical errors that were not detected and corrected.       Jomarie Longs, MD 12/30/2020, 1:55 PM

## 2021-01-02 ENCOUNTER — Other Ambulatory Visit: Payer: Self-pay | Admitting: Psychiatry

## 2021-01-02 DIAGNOSIS — F5105 Insomnia due to other mental disorder: Secondary | ICD-10-CM

## 2021-01-06 ENCOUNTER — Encounter: Payer: Self-pay | Admitting: Psychology

## 2021-01-14 NOTE — Progress Notes (Signed)
Cancelled.  

## 2021-02-14 ENCOUNTER — Other Ambulatory Visit: Payer: Self-pay | Admitting: Psychiatry

## 2021-02-14 DIAGNOSIS — F3181 Bipolar II disorder: Secondary | ICD-10-CM

## 2021-02-21 ENCOUNTER — Other Ambulatory Visit: Payer: Self-pay

## 2021-02-21 ENCOUNTER — Ambulatory Visit: Payer: Self-pay | Admitting: *Deleted

## 2021-02-21 DIAGNOSIS — E7800 Pure hypercholesterolemia, unspecified: Secondary | ICD-10-CM

## 2021-02-21 DIAGNOSIS — D649 Anemia, unspecified: Secondary | ICD-10-CM

## 2021-02-21 NOTE — Progress Notes (Signed)
Labs per NP Inetta Fermo orders.

## 2021-02-22 LAB — CBC WITH DIFFERENTIAL/PLATELET
Basophils Absolute: 0.1 10*3/uL (ref 0.0–0.2)
Basos: 1 %
EOS (ABSOLUTE): 0.1 10*3/uL (ref 0.0–0.4)
Eos: 2 %
Hematocrit: 40.1 % (ref 37.5–51.0)
Hemoglobin: 13.3 g/dL (ref 13.0–17.7)
Immature Grans (Abs): 0 10*3/uL (ref 0.0–0.1)
Immature Granulocytes: 1 %
Lymphocytes Absolute: 3 10*3/uL (ref 0.7–3.1)
Lymphs: 40 %
MCH: 30.9 pg (ref 26.6–33.0)
MCHC: 33.2 g/dL (ref 31.5–35.7)
MCV: 93 fL (ref 79–97)
Monocytes Absolute: 0.5 10*3/uL (ref 0.1–0.9)
Monocytes: 7 %
Neutrophils Absolute: 3.9 10*3/uL (ref 1.4–7.0)
Neutrophils: 49 %
Platelets: 336 10*3/uL (ref 150–450)
RBC: 4.31 x10E6/uL (ref 4.14–5.80)
RDW: 10.5 % — ABNORMAL LOW (ref 11.6–15.4)
WBC: 7.6 10*3/uL (ref 3.4–10.8)

## 2021-02-22 LAB — COMPREHENSIVE METABOLIC PANEL
ALT: 53 IU/L — ABNORMAL HIGH (ref 0–44)
AST: 61 IU/L — ABNORMAL HIGH (ref 0–40)
Albumin/Globulin Ratio: 1.7 (ref 1.2–2.2)
Albumin: 4.7 g/dL (ref 4.0–5.0)
Alkaline Phosphatase: 78 IU/L (ref 44–121)
BUN/Creatinine Ratio: 15 (ref 9–20)
BUN: 16 mg/dL (ref 6–20)
Bilirubin Total: 0.4 mg/dL (ref 0.0–1.2)
CO2: 24 mmol/L (ref 20–29)
Calcium: 9.5 mg/dL (ref 8.7–10.2)
Chloride: 99 mmol/L (ref 96–106)
Creatinine, Ser: 1.08 mg/dL (ref 0.76–1.27)
Globulin, Total: 2.8 g/dL (ref 1.5–4.5)
Glucose: 100 mg/dL — ABNORMAL HIGH (ref 70–99)
Potassium: 4.7 mmol/L (ref 3.5–5.2)
Sodium: 139 mmol/L (ref 134–144)
Total Protein: 7.5 g/dL (ref 6.0–8.5)
eGFR: 90 mL/min/{1.73_m2} (ref >59)

## 2021-03-24 ENCOUNTER — Other Ambulatory Visit
Admission: RE | Admit: 2021-03-24 | Discharge: 2021-03-24 | Disposition: A | Discharge status: Home / Self Care | Payer: No Typology Code available for payment source | Source: Ambulatory Visit | Attending: Infectious Diseases | Admitting: Infectious Diseases

## 2021-03-24 ENCOUNTER — Ambulatory Visit: Payer: No Typology Code available for payment source | Attending: Infectious Diseases | Admitting: Infectious Diseases

## 2021-03-24 ENCOUNTER — Other Ambulatory Visit: Payer: Self-pay

## 2021-03-24 VITALS — BP 118/74 | HR 63 | Resp 16 | Ht 66.5 in | Wt 175.0 lb

## 2021-03-24 DIAGNOSIS — B20 Human immunodeficiency virus [HIV] disease: Secondary | ICD-10-CM | POA: Diagnosis not present

## 2021-03-24 DIAGNOSIS — E785 Hyperlipidemia, unspecified: Secondary | ICD-10-CM | POA: Insufficient documentation

## 2021-03-24 DIAGNOSIS — D6851 Activated protein C resistance: Secondary | ICD-10-CM | POA: Diagnosis not present

## 2021-03-24 DIAGNOSIS — Z79899 Other long term (current) drug therapy: Secondary | ICD-10-CM | POA: Insufficient documentation

## 2021-03-24 DIAGNOSIS — F319 Bipolar disorder, unspecified: Secondary | ICD-10-CM | POA: Insufficient documentation

## 2021-03-24 LAB — COMPREHENSIVE METABOLIC PANEL
ALT: 18 U/L (ref 0–44)
AST: 19 U/L (ref 15–41)
Albumin: 4.6 g/dL (ref 3.5–5.0)
Alkaline Phosphatase: 55 U/L (ref 38–126)
Anion gap: 7 (ref 5–15)
BUN: 17 mg/dL (ref 6–20)
CO2: 26 mmol/L (ref 22–32)
Calcium: 9.7 mg/dL (ref 8.9–10.3)
Chloride: 102 mmol/L (ref 98–111)
Creatinine, Ser: 1.3 mg/dL — ABNORMAL HIGH (ref 0.61–1.24)
GFR, Estimated: >60 mL/min (ref >60)
Glucose, Bld: 104 mg/dL — ABNORMAL HIGH (ref 70–99)
Potassium: 4.1 mmol/L (ref 3.5–5.1)
Sodium: 135 mmol/L (ref 135–145)
Total Bilirubin: 0.7 mg/dL (ref 0.3–1.2)
Total Protein: 7.7 g/dL (ref 6.5–8.1)

## 2021-03-24 LAB — CBC WITH DIFFERENTIAL/PLATELET
Abs Immature Granulocytes: 0.02 10*3/uL (ref 0.00–0.07)
Basophils Absolute: 0.1 10*3/uL (ref 0.0–0.1)
Basophils Relative: 1 %
Eosinophils Absolute: 0.2 10*3/uL (ref 0.0–0.5)
Eosinophils Relative: 3 %
HCT: 41.8 % (ref 39.0–52.0)
Hemoglobin: 14.2 g/dL (ref 13.0–17.0)
Immature Granulocytes: 0 %
Lymphocytes Relative: 33 %
Lymphs Abs: 2.7 10*3/uL (ref 0.7–4.0)
MCH: 31.9 pg (ref 26.0–34.0)
MCHC: 34 g/dL (ref 30.0–36.0)
MCV: 93.9 fL (ref 80.0–100.0)
Monocytes Absolute: 0.5 10*3/uL (ref 0.1–1.0)
Monocytes Relative: 6 %
Neutro Abs: 4.7 10*3/uL (ref 1.7–7.7)
Neutrophils Relative %: 57 %
Platelets: 300 10*3/uL (ref 150–400)
RBC: 4.45 MIL/uL (ref 4.22–5.81)
RDW: 11.5 % (ref 11.5–15.5)
WBC: 8.2 10*3/uL (ref 4.0–10.5)
nRBC: 0 % (ref 0.0–0.2)

## 2021-03-24 LAB — CHLAMYDIA/NGC RT PCR (ARMC ONLY)
Chlamydia Tr: NOT DETECTED
Chlamydia Tr: NOT DETECTED
Chlamydia Tr: NOT DETECTED
N gonorrhoeae: NOT DETECTED
N gonorrhoeae: NOT DETECTED
N gonorrhoeae: NOT DETECTED

## 2021-03-24 LAB — HEPATITIS C ANTIBODY: HCV Ab: NONREACTIVE

## 2021-03-24 MED ORDER — BIKTARVY 50-200-25 MG PO TABS: 1.0000 | ORAL_TABLET | Freq: Every day | ORAL | 6 refills | Status: DC

## 2021-03-24 NOTE — Patient Instructions (Signed)
You are here for follow up- today we will do all the screening- continue biktarvy

## 2021-03-24 NOTE — Progress Notes (Signed)
NAME: Darrell Kennedy  DOB: Mar 30, 1981  MRN: [id number]  Date/Time: 03/24/2021 9:13 AM  Subjective:  Follow up HIV visit ? Darrell Kennedy is a 40 y.o. with a history of HIV Is here for follow up. Last seen in  aug 2022 On Biktarvy and 100% adherent Pt has a new partner for the past 3 months In nov 2022 he was exposed to a person with syphilis and he took doxycycline.due to amoxicillin  allergy. He did not test positive Doing well   Doing well No complaints No side effects from med Followed by psychiatrist Dr.Eppen for Bipolar disorder which is well controlled.   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 Was transient. underwent work up with Neg MRI, ECHO, TEE which did not show any intracardiac shunting. He saw neurologist Dr.Shah at the [facility] clinic on 02/17/19  and he sent hypercoagulable panel ESR, CRP - Factor V Leiden mutation - Antithrombin III functional assay - MTHFR gene mutation - Protein C and S activity (functional assay) - Homocysteine - Lupus anticoagulant comprehensive - Antiphospholipid syndrome (includes aPTT, PT, INR, Thrombin time, Dilute Viper Venom Time, Hexagonal phase phospholipid, Anticoardiolipin antibody IgG and IgM, Beta - 2 Glycoprotein IgG and IgM,   Anticardiolipin IgM  ab was elevated at 13 ( 0-12 ) N and homocysteine was 16( 0-14.5) Started on methylfolate  and aspirin . seen heme once Dr.Finnegan .    HIV history HIV diagnosed NOV 2015 in  [place] when he had flu like symptoms and knew he had acute HIV and went and got tested. Nadir Cd4 was > 700  He saw Dr.PAul Tamala Julian and was started on isentress and truvada. He later changed to [facility]. Since end of 2017 he was off treatment because of lack of insurance. He moved to [facility] in 2018 and did not  engage in care because of lack on insurance until  Jan 2020  Nadir Cd4 484 VL >200,000 OI none HAARt history truvada isentress Genvoya Acquired thru-sex Genotype done in [place] ? Past Medical History:  Diagnosis Date   Anxiety    Depression    Heart murmur    HIV (human immunodeficiency virus infection) ([place])    HLD (hyperlipidemia)    Stroke [facility])     Surgical History -none  FH Father- diabetes, HTN Mother-adrenal tumor removed  ?SH Non smoker No illicit drug use Occasional alcohol Has traveled to [place] in 2013 Lives with his parents  ? Current Outpatient Medications  Medication Sig Dispense Refill   aspirin (ASPIRIN CHILDRENS) 81 MG chewable tablet Chew 1 tablet (81 mg total) by mouth daily. 120 tablet 0   bictegravir-emtricitabine-tenofovir AF (BIKTARVY) 50-200-25 MG TABS tablet Take 1 tablet by mouth daily. 30 tablet 4   escitalopram (LEXAPRO) 10 MG tablet TAKE 1 TABLET(10 MG) BY MOUTH DAILY 90 tablet 0   ketoconazole (NIZORAL) 2 % shampoo Apply topically.     lamoTRIgine (LAMICTAL) 200 MG tablet TAKE 1 TABLET(200 MG) BY MOUTH DAILY 90 tablet 0   lamoTRIgine (LAMICTAL) 25 MG tablet TAKE 1 TABLET(25 MG) BY MOUTH TWICE DAILY. START TAKING WITH 200 MG DAILY 180 tablet 0   Multiple Vitamin (MULTIVITAMIN WITH MINERALS) TABS tablet Take 1 tablet by mouth daily.     zolpidem (  AMBIEN CR) 12.5 MG CR tablet TAKE 1 TABLET(12.5 MG) BY MOUTH AT BEDTIME AS NEEDED FOR SLEEP 90 tablet 0   atorvastatin (LIPITOR) 20 MG tablet Take 1 tablet (20 mg total) by mouth daily. 90 tablet 0   No current facility-administered medications for this visit.    REVIEW OF SYSTEMS:  Const: negative fever, negative chills, negative weight loss Eyes: negative diplopia or visual changes, negative eye pain ENT: negative coryza, negative sore throat Resp: negative cough, hemoptysis, dyspnea Cards: negative for chest pain, palpitations, lower extremity edema GU: negative for frequency, dysuria and  hematuria Skin: negative for rash and pruritus Heme: negative for easy bruising and gum/nose bleeding MS: negative for myalgias, arthralgias, back pain and muscle weakness Neurolo:negative for headaches, dizziness, vertigo, memory problems    Objective:  VITALS:  BP 118/74   Pulse 63   Resp 16   Ht 5' 6.5" (1.689 m)   Wt 175 lb (79.4 kg)   SpO2 95%   BMI 27.82 kg/m  PHYSICAL EXAM:  General: Alert, cooperative, no distress, appears stated age.  Head: Normocephalic, without obvious abnormality, atraumatic. Eyes: Conjunctivae clear, anicteric sclerae. Pupils are equal Nose: Nares normal. No drainage or sinus tenderness. Throat: Lips, mucosa, and tongue normal. No Thrush Neck: Supple, symmetrical, no adenopathy, thyroid: non tender no carotid bruit and no JVD. Back: No CVA tenderness. Lungs: Clear to auscultation bilaterally. No Wheezing or Rhonchi. No rales. Heart: Regular rate and rhythm, no murmur, rub or gallop. Abdomen: Soft, non-tender,not distended. Bowel sounds normal. No masses Extremities: Extremities normal, atraumatic, no cyanosis. No edema. No clubbing Skin: No rashes or lesions. Not Jaundiced Lymph: Cervical, supraclavicular normal. Neurologic: Grossly non-focal Pertinent Labs  IMAGING RESULTS: Health maintenance Vaccination  Vaccine Date last given comment  Influenza 01/18/21   Hepatitis B 05/15/2014,2/29 &11/24/14 [place]  Hepatitis A 05/15/14 &06/15/14   Prevnar-PCV-13 04/02/2014   Pneumovac-PPSV-23 09/10/18   TdaP 05/15/14   HPV 09/23/19, 10/21/19 and 03/18/20   Shingrix ( zoster vaccine)    Corona virus vaccine X 3 08/05/19, 5/21, 02/06/20, 01/18/21   Monkey pox- aug.sept ______________________  Labs Lab Result  Date comment  HIV VL 30 6/22   CD4 84531%) 03/18/20   Genotype Negative 04/09/2014 [facility] [place]  [id number] NEG 04/02/2014   HIV antibody Reactive 05/07/18   RPR 1:1 ( but TAP NEG NR 05/07/18  09/23/19 False positive  Quantiferon Gold NR 03/18/20   Hep  C ab NR 05/07/18   Hepatitis B-ab,ag,c Ab-positive 05/07/18 vaccinated  Hepatitis A-IgM, IgG /T     Lipid 161/58/89/70 09/23/19   GC/CHL NR 09/23/19   PAP LGSIL 09/23/19 Will repeat in 6 months  HB,PLT,Cr, LFT 14/284/1.34/N      Preventive  Procedure Result  Date comment  colonoscopy     Mammogram     Dental exam     Opthal       Impression/Recommendation ? HIV- on Biktarvy- doing well- 100% adherent . Last Vl 30 and Cd4 >800 from June 2022 Will do labs today  TIA like episode-none in the past year  investigated and found to have borderline high Anticardiolipin IgM and homocysteine- on aspirin and methyl folate Followed by heme onc who does not think the labs are significant It was thought to be due to his medication ( antidepressant/  Bipolar  disorder- followed by Psychiatrist- on lamictal $RemoveBef'250mg'KjkFabacnb$  QD,  ambien PRN q Hs  Monkey pox vaccine x2 - aug  Hyperlipidemia- on atorvastatin  Annual visit- health maintenance updated anal  PAPGC/Chl/RPR/HEPC/Quant gold-VL and cd4 done today  Follow up 6 months  ___________________________________________________ Discussed with patient in detail

## 2021-03-25 ENCOUNTER — Other Ambulatory Visit: Payer: Self-pay

## 2021-03-25 DIAGNOSIS — B20 Human immunodeficiency virus [HIV] disease: Secondary | ICD-10-CM

## 2021-03-25 LAB — T-HELPER CELLS CD4/CD8 %
% CD 4 Pos. Lymph.: 30.2 % — ABNORMAL LOW (ref 30.8–58.5)
Absolute CD 4 Helper: 755 /uL (ref 359–1519)
Basophils Absolute: 0.1 10*3/uL (ref 0.0–0.2)
Basos: 1 %
CD3+CD4+ Cells/CD3+CD8+ Cells Bld: 0.62 — ABNORMAL LOW (ref 0.92–3.72)
CD3+CD8+ Cells # Bld: 1223 /uL — ABNORMAL HIGH (ref 109–897)
CD3+CD8+ Cells NFr Bld: 48.9 % — ABNORMAL HIGH (ref 12.0–35.5)
EOS (ABSOLUTE): 0.2 10*3/uL (ref 0.0–0.4)
Eos: 2 %
Hematocrit: 42.2 % (ref 37.5–51.0)
Hemoglobin: 14.3 g/dL (ref 13.0–17.7)
Immature Grans (Abs): 0 10*3/uL (ref 0.0–0.1)
Immature Granulocytes: 0 %
Lymphocytes Absolute: 2.5 10*3/uL (ref 0.7–3.1)
Lymphs: 32 %
MCH: 31.6 pg (ref 26.6–33.0)
MCHC: 33.9 g/dL (ref 31.5–35.7)
MCV: 93 fL (ref 79–97)
Monocytes Absolute: 0.5 10*3/uL (ref 0.1–0.9)
Monocytes: 7 %
Neutrophils Absolute: 4.5 10*3/uL (ref 1.4–7.0)
Neutrophils: 58 %
Platelets: 315 10*3/uL (ref 150–450)
RBC: 4.52 x10E6/uL (ref 4.14–5.80)
RDW: 11.4 % — ABNORMAL LOW (ref 11.6–15.4)
WBC: 7.8 10*3/uL (ref 3.4–10.8)

## 2021-03-25 LAB — HIV-1 RNA QUANT-NO REFLEX-BLD
HIV 1 RNA Quant: <20 copies/mL
LOG10 HIV-1 RNA: UNDETERMINED log10copy/mL

## 2021-03-25 LAB — RPR: RPR Ser Ql: NONREACTIVE

## 2021-03-28 ENCOUNTER — Other Ambulatory Visit: Payer: Self-pay | Admitting: Psychiatry

## 2021-03-28 DIAGNOSIS — F3176 Bipolar disorder, in full remission, most recent episode depressed: Secondary | ICD-10-CM

## 2021-03-28 LAB — QUANTIFERON-TB GOLD PLUS (RQFGPL)
QuantiFERON Mitogen Value: >10 IU/mL
QuantiFERON Nil Value: 0.13 IU/mL
QuantiFERON TB1 Ag Value: 0.08 IU/mL
QuantiFERON TB2 Ag Value: 0.09 IU/mL

## 2021-03-28 LAB — QUANTIFERON-TB GOLD PLUS: QuantiFERON-TB Gold Plus: NEGATIVE

## 2021-03-29 ENCOUNTER — Telehealth: Payer: Self-pay

## 2021-03-29 NOTE — Telephone Encounter (Signed)
Advised VL undetectable all labs look good. Drink more water. Patient agrees and had reviewed on mychart as well.

## 2021-03-31 ENCOUNTER — Encounter: Payer: Self-pay | Admitting: Psychiatry

## 2021-03-31 ENCOUNTER — Telehealth (INDEPENDENT_AMBULATORY_CARE_PROVIDER_SITE_OTHER): Payer: No Typology Code available for payment source | Admitting: Psychiatry

## 2021-03-31 ENCOUNTER — Other Ambulatory Visit: Payer: Self-pay

## 2021-03-31 DIAGNOSIS — F3181 Bipolar II disorder: Secondary | ICD-10-CM

## 2021-03-31 DIAGNOSIS — F5105 Insomnia due to other mental disorder: Secondary | ICD-10-CM | POA: Diagnosis not present

## 2021-03-31 DIAGNOSIS — R41840 Attention and concentration deficit: Secondary | ICD-10-CM | POA: Diagnosis not present

## 2021-03-31 LAB — CYTOLOGY - PAP
Comment: NEGATIVE
Diagnosis: UNDETERMINED — AB
High risk HPV: NEGATIVE

## 2021-03-31 MED ORDER — LAMOTRIGINE 150 MG PO TABS: 150.0000 mg | ORAL_TABLET | Freq: Two times a day (BID) | ORAL | 0 refills | Status: DC

## 2021-03-31 MED ORDER — ESCITALOPRAM OXALATE 10 MG PO TABS: ORAL_TABLET | ORAL | 0 refills | Status: DC

## 2021-03-31 MED ORDER — ZOLPIDEM TARTRATE ER 12.5 MG PO TBCR: EXTENDED_RELEASE_TABLET | ORAL | 0 refills | Status: DC

## 2021-03-31 NOTE — Progress Notes (Signed)
Virtual Visit via Video Note  I connected with Darrell Kennedy on 03/31/21 at 11:30 AM EST by a video enabled telemedicine application and verified that I am speaking with the correct person using two identifiers.  Location Provider Location : [facility] Patient Location : Work  Participants: Patient , Provider   I discussed the limitations of evaluation and management by telemedicine and the availability of in person appointments. The patient expressed understanding and agreed to proceed. I discussed the assessment and treatment plan with the patient. The patient was provided an opportunity to ask questions and all were answered. The patient agreed with the plan and demonstrated an understanding of the instructions.   The patient was advised to call back or seek an in-person evaluation if the symptoms worsen or if the condition fails to improve as anticipated.   BH MD OP Progress Note  03/31/2021 1:23 PM Darrell Kennedy  MRN:  [id number]  Chief Complaint:  Chief Complaint   Follow-up; Anxiety; Depression    HPI: Darrell Kennedy is a 40 year old Caucasian male, lives in [place], has a history of bipolar disorder, insomnia, hyperlipidemia, HIV-positive was evaluated by telemedicine today.  Patient reports he recently started having mood swings few weeks ago.  Patient reports it started with hypomanic symptoms of having a lot of energy, having sleep problems, feeling fast, talking too fast, racing thoughts, concentration problems which lasted for a few days and then he crashed to the point that he felt depressed, low energy, low motivation and so on.  Patient reports he was able to function at work throughout this time.  Patient reports he feels he is currently coming out of the depressive phase.  He is sleeping better.  The Ambien does help.  Patient continues to stay compliant on his medications, denies side effects.  Patient denies any suicidality, homicidality or perceptual  disturbances.  Patient however reports he is interested in a dosage increase of his mood stabilizer if possible.  Patient denies any other concerns today.  Visit Diagnosis:    ICD-10-CM   1. Bipolar 2 disorder (HCC)  F31.81 lamoTRIgine (LAMICTAL) 150 MG tablet   hypomanic , mixed features    2. Insomnia due to mental condition  F51.05 zolpidem (AMBIEN CR) 12.5 MG CR tablet   mood    3. Attention and concentration deficit  R41.840 escitalopram (LEXAPRO) 10 MG tablet      Past Psychiatric History: Reviewed past psychiatric history from progress note on 02/06/2019.  Past trials of BuSpar, Seroquel  Past Medical History:  Past Medical History:  Diagnosis Date   Anxiety    Depression    Heart murmur    HIV (human immunodeficiency virus infection) (HCC)    HLD (hyperlipidemia)    Stroke [facility])     Past Surgical History:  Procedure Laterality Date   TEE WITHOUT CARDIOVERSION N/A 03/05/2019   Procedure: TRANSESOPHAGEAL ECHOCARDIOGRAM (TEE);  Surgeon: Lamar Blinks, MD;  Location: [facility] ORS;  Service: Cardiovascular;  Laterality: N/A;    Family Psychiatric History: Reviewed family psychiatric history from progress note on 02/06/2019.  Family History:  Family History  Problem Relation Age of Onset   Hypertension Father    Diabetes Father    Kidney disease Father    Depression Father    Hyperlipidemia Father    Alcohol abuse Brother    Heart disease Paternal Grandmother    Drug abuse Cousin     Social History: Reviewed social history from progress note on 02/06/2019 Social History  Socioeconomic History   Marital status: Single    Spouse name: Not on file   Number of children: 0   Years of education: Not on file   Highest education level: Master's degree (e.g., MA, MS, MEng, MEd, MSW, MBA)  Occupational History   Occupation: student  Tobacco Use   Smoking status: Never   Smokeless tobacco: Never   Tobacco comments:    None  Vaping Use   Vaping Use: Never  used  Substance and Sexual Activity   Alcohol use: Yes    Alcohol/week: 0.0 standard drinks    Comment: social maybe once a week   Drug use: Never   Sexual activity: Yes    Birth control/protection: Condom  Other Topics Concern   Not on file  Social History Narrative   Not on file   Social Determinants of Health   Financial Resource Strain: Not on file  Food Insecurity: Not on file  Transportation Needs: Not on file  Physical Activity: Not on file  Stress: Not on file  Social Connections: Not on file    Allergies:  Allergies  Allergen Reactions   Amoxicillin Rash    Metabolic Disorder Labs: Lab Results  Component Value Date   HGBA1C 4.2 (L) 11/05/2020   MPG 114.02 02/12/2019   No results found for: PROLACTIN Lab Results  Component Value Date   CHOL 232 (H) 11/05/2020   TRIG 84 11/05/2020   HDL 49 11/05/2020   CHOLHDL 4.7 11/05/2020   VLDL 14 09/23/2019   LDLCALC 168 (H) 11/05/2020   LDLCALC 89 09/23/2019   Lab Results  Component Value Date   TSH 0.965 11/05/2020   TSH 1.78 02/12/2019    Therapeutic Level Labs: No results found for: LITHIUM No results found for: VALPROATE No components found for:  CBMZ  Current Medications: Current Outpatient Medications  Medication Sig Dispense Refill   lamoTRIgine (LAMICTAL) 150 MG tablet Take 1 tablet (150 mg total) by mouth 2 (two) times daily. 180 tablet 0   aspirin (ASPIRIN CHILDRENS) 81 MG chewable tablet Chew 1 tablet (81 mg total) by mouth daily. 120 tablet 0   atorvastatin (LIPITOR) 20 MG tablet Take 1 tablet (20 mg total) by mouth daily. 90 tablet 0   bictegravir-emtricitabine-tenofovir AF (BIKTARVY) 50-200-25 MG TABS tablet Take 1 tablet by mouth daily. 30 tablet 6   escitalopram (LEXAPRO) 10 MG tablet TAKE 1 TABLET(10 MG) BY MOUTH DAILY 90 tablet 0   ketoconazole (NIZORAL) 2 % shampoo Apply topically.     Multiple Vitamin (MULTIVITAMIN WITH MINERALS) TABS tablet Take 1 tablet by mouth daily.     zolpidem  (AMBIEN CR) 12.5 MG CR tablet TAKE 1 TABLET(12.5 MG) BY MOUTH AT BEDTIME AS NEEDED FOR SLEEP 90 tablet 0   No current facility-administered medications for this visit.     Musculoskeletal: Strength & Muscle Tone:  UTA Gait & Station:  seated Patient leans: N/A  Psychiatric Specialty Exam: Review of Systems  Psychiatric/Behavioral:  Positive for decreased concentration.        Mood swings  All other systems reviewed and are negative.  There were no vitals taken for this visit.There is no height or weight on file to calculate BMI.  General Appearance: Casual  Eye Contact:  Fair  Speech:  Clear and Coherent  Volume:  Normal  Mood:   mood swings  Affect:  Full Range  Thought Process:  Goal Directed and Descriptions of Associations: Intact  Orientation:  Full (Time, Place, and Person)  Thought  Content: Logical   Suicidal Thoughts:  No  Homicidal Thoughts:  No  Memory:  Immediate;   Fair Recent;   Fair Remote;   Fair  Judgement:  Fair  Insight:  Fair  Psychomotor Activity:  Normal  Concentration:  Concentration: Fair and Attention Span: Fair  Recall:  [employer] of Knowledge: Fair  Language: Fair  Akathisia:  No  Handed:  Right  AIMS (if indicated): done, 0  Assets:  Communication Skills Desire for Improvement Housing Social Support Talents/Skills Transportation Vocational/Educational  ADL's:  Intact  Cognition: WNL  Sleep:   improving   Screenings: AUDIT    Flowsheet Row Office Visit from 10/07/2018 in [facility]  Alcohol Use Disorder Identification Test Final Score (AUDIT) 3      GAD-7    Flowsheet Row Video Visit from 03/31/2021 in [facility] Video Visit from 09/15/2020 in [facility]  Total GAD-7 Score 0 2      PHQ2-9    Flowsheet Row Video Visit from 03/31/2021 in [facility] Video Visit from 12/30/2020 in [facility]  Video Visit from 09/15/2020 in [facility] Video Visit from 06/07/2020 in [facility] Office Visit from 03/30/2020 in [facility]  PHQ-2 Total Score 3 0 0 0 0  PHQ-9 Total Score 10 5 -- -- 0      Flowsheet Row Video Visit from 03/31/2021 in [facility] Video Visit from 09/15/2020 in [facility] Video Visit from 06/07/2020 in [facility]  C-SSRS RISK CATEGORY No Risk No Risk No Risk        Assessment and Plan: Darrell Kennedy is a 40 year old Caucasian male, employed, lives in [place], has a history of bipolar disorder, hyperlipidemia, HIV positive was evaluated by telemedicine today.  Patient is currently struggling with mood swings, will benefit from the following plan.  Plan Bipolar disorder type II most recent episode mixed with hypomania-improving Will increase lamotrigine to 300 mg p.o. daily in divided dosage Patient advised to start taking 275 mg p.o. daily-he has supplies at home for the next 2 weeks before increasing to 300 mg daily Lexapro 10 mg p.o. daily  Insomnia-stable Ambien CR 12.5 mg p.o. nightly  Attention and concentration deficit-unstable Patient has been referred for ADHD testing-has upcoming appointment in March 2023  Continue psychotherapy sessions as needed  Follow-up in clinic in 3 months or sooner in person.  This note was generated in part or whole with voice recognition software. Voice recognition is usually quite accurate but there are transcription errors that can and very often do occur. I apologize for any typographical errors that were not detected and corrected.    Jomarie Longs, MD 04/01/2021, 1:23 PM

## 2021-04-04 ENCOUNTER — Encounter: Payer: Self-pay | Admitting: Infectious Diseases

## 2021-04-22 ENCOUNTER — Telehealth: Payer: Self-pay

## 2021-04-22 NOTE — Telephone Encounter (Signed)
Patient left voicemail stating he was unable to get Tolar from the pharmacy. Per chart, 6 refills were sent on 03/24/21. [facility] states they do not have this on file. RN gave verbal order to [facility] to refill Biktarvy with 6 refills per provider. Notified patient that refills have been approved, asked that he call or send MyChart message with any difficulties. Patient verbalized understanding and has no further questions.   Sandie Ano, RN

## 2021-04-25 ENCOUNTER — Telehealth: Payer: Self-pay | Admitting: *Deleted

## 2021-04-25 ENCOUNTER — Encounter: Payer: Self-pay | Admitting: *Deleted

## 2021-04-25 DIAGNOSIS — J111 Influenza due to unidentified influenza virus with other respiratory manifestations: Secondary | ICD-10-CM

## 2021-04-25 MED ORDER — OSELTAMIVIR PHOSPHATE 75 MG PO CAPS: 75.0000 mg | ORAL_CAPSULE | Freq: Two times a day (BID) | ORAL | 0 refills | Status: AC

## 2021-04-25 NOTE — Telephone Encounter (Signed)
Case discussed with RN Rolly Salter electronic Rx for tamiflu 75mg  po BID x 5 days #10 RF0 sent to patient pharmacy of choice.  Will follow up with patient via telephone 04/26/2021

## 2021-04-25 NOTE — Telephone Encounter (Signed)
Pt called out of work 1/9. Reports sx that started Sat 1/7. Congestion, cough, sore throat. Developed body aches today. Has remained afebrile. Home covid test 1/7 and repeat 1/9 were both negative.  Has been taking Tylenol at home. Discussed switching to Dayquil for multi-sx relief. Nyquil at night and no extra Tylenol with these. Add nasal saline as well.  Due to 2 negative covid tests and new sx of body aches, discussed Tamiflu with pt. He has never taken this. Reviewed benefits and possible side effects. Pt would like to proceed with this. Order placed to pharmacy of choice.  Pt denies questions or concerns at this time. Will f/u tomorrow for sx check.

## 2021-04-26 NOTE — Telephone Encounter (Signed)
Spoke with pt by phone. Feels much better from yesterday. Congestion still present but much improved. Body aches, cough and sore throat resolved. Remains afebrile. Has taken 2 dose of Tamiflu. Cleared to RTW tomorrow 1/11 and is ready to do so. Mask wear as long as coughing or sneezing. Pt agreeable and denies needs or concerns.

## 2021-04-27 ENCOUNTER — Other Ambulatory Visit: Payer: Self-pay | Admitting: Sports Medicine

## 2021-04-27 DIAGNOSIS — M25562 Pain in left knee: Secondary | ICD-10-CM

## 2021-04-27 DIAGNOSIS — S8992XA Unspecified injury of left lower leg, initial encounter: Secondary | ICD-10-CM

## 2021-04-27 DIAGNOSIS — M2392 Unspecified internal derangement of left knee: Secondary | ICD-10-CM

## 2021-04-27 DIAGNOSIS — M25462 Effusion, left knee: Secondary | ICD-10-CM

## 2021-04-27 NOTE — Telephone Encounter (Signed)
Reviewed RN Hildred Alamin note agreed with plan of care.  Patient contacted via telephone and stated RTW today without difficulty some congestion still taking tamiflu and dayquil/nyquil.  Some lethargy/feeling tired.  Wearing his mask at work until symptoms resolve and no eating in employee lunch room.  HR notified RTW 04/27/2021 symptoms improved mask wear until symptoms resolved.  Patient A&Ox3 spoke full sentences without difficulty; no audible congestion/cough/throat clearing.  Patient verbalized understanding information/instructions, agreed with plan of care and had no further questions at this time.

## 2021-05-01 NOTE — Telephone Encounter (Signed)
Patient contacted via telephone reported feeling much better denied concerns or questions.  A&Ox3 spoke full sentences without difficulty no audible congestion/throat clearing or cough during 2 minute call.  Encounter closed.

## 2021-05-05 ENCOUNTER — Ambulatory Visit
Admission: RE | Admit: 2021-05-05 | Discharge: 2021-05-05 | Disposition: A | Discharge status: Home / Self Care | Payer: No Typology Code available for payment source | Source: Ambulatory Visit | Attending: Sports Medicine | Admitting: Sports Medicine

## 2021-05-05 ENCOUNTER — Other Ambulatory Visit: Payer: Self-pay

## 2021-05-05 DIAGNOSIS — M2392 Unspecified internal derangement of left knee: Secondary | ICD-10-CM | POA: Diagnosis present

## 2021-05-05 DIAGNOSIS — M25462 Effusion, left knee: Secondary | ICD-10-CM | POA: Diagnosis present

## 2021-05-05 DIAGNOSIS — S8992XA Unspecified injury of left lower leg, initial encounter: Secondary | ICD-10-CM | POA: Insufficient documentation

## 2021-05-05 DIAGNOSIS — M25562 Pain in left knee: Secondary | ICD-10-CM | POA: Insufficient documentation

## 2021-05-05 IMAGING — MR MR KNEE*L* W/O CM
7 series · 40 of 40 positions shown · non-contrast
Comparison: None.

CLINICAL DATA: Ski injury in twisting motion 3 weeks ago. Lateral
left knee pain and swelling.

EXAM:
MRI OF THE LEFT KNEE WITHOUT CONTRAST
TECHNIQUE: Multiplanar, multisequence MR imaging of the knee was performed. No
intravenous contrast was administered.

[Series 8: T2 fat-sat · axial · left · 4.0mm · 0.50mm/px · z∈[-98,+47]mm · 5 of 30 slices shown (1 of 3)]
[im 1/30]
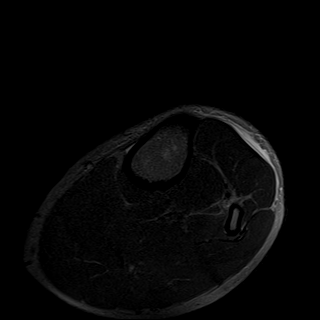
[im 8/30]
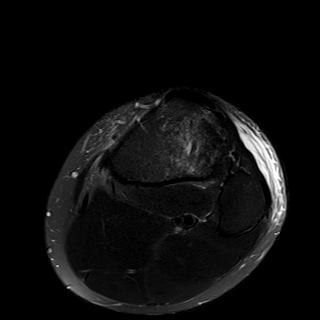
[im 15/30]
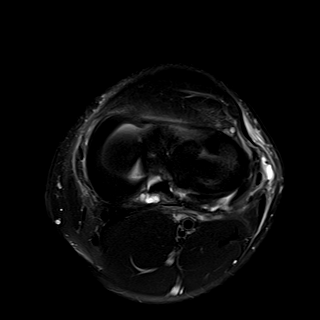
[im 22/30]
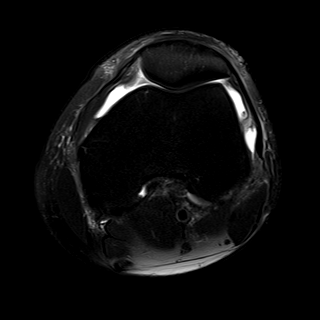
[im 30/30]
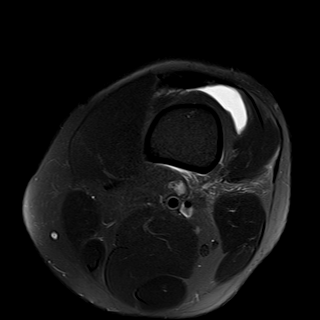

[Series 9: T2 fat-sat · coronal · left · 4.0mm · 0.62mm/px · 6 of 28 slices shown (2 of 3)]
[im 1/28]
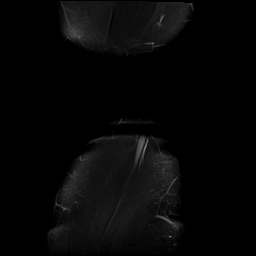
[im 6/28]
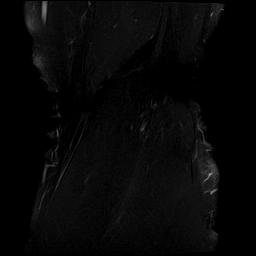
[im 11/28]
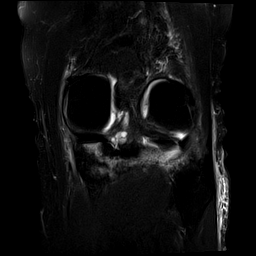
[im 17/28]
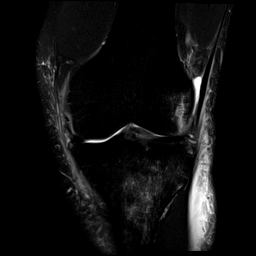
[im 22/28]
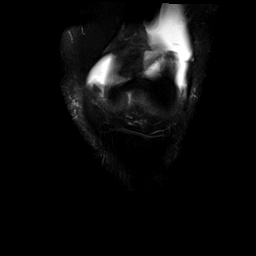
[im 28/28]
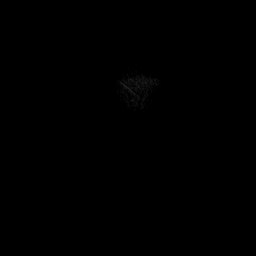

[Series 10: T1 · coronal · left · 4.0mm · 0.62mm/px · 6 of 28 slices shown]
[im 1/28]
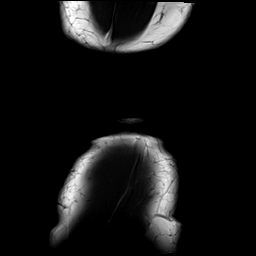
[im 6/28]
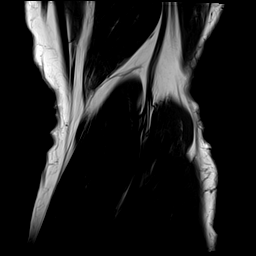
[im 11/28]
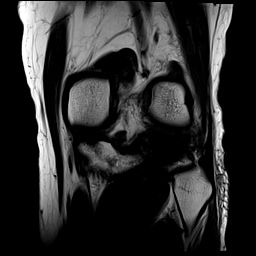
[im 17/28]
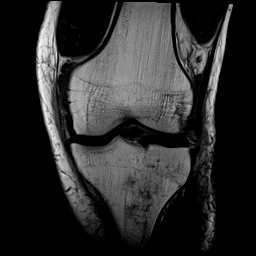
[im 22/28]
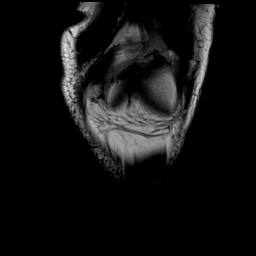
[im 28/28]
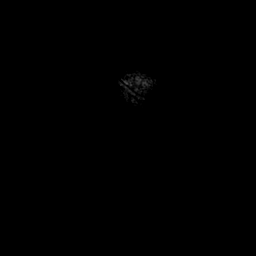

[Series 11: PD fat-sat · coronal · left · 4.0mm · 0.62mm/px · 6 of 28 slices shown (1 of 2)]
[im 1/28]
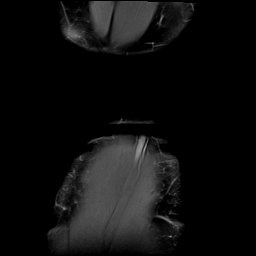
[im 6/28]
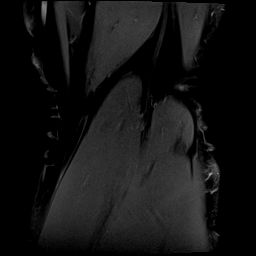
[im 11/28]
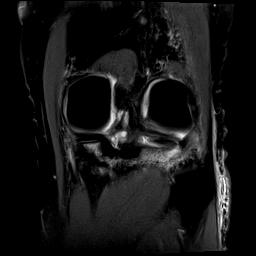
[im 17/28]
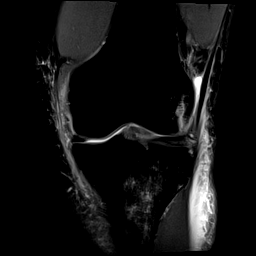
[im 22/28]
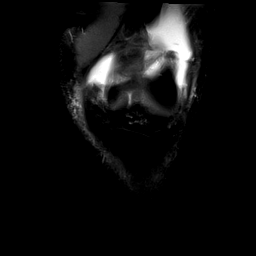
[im 28/28]
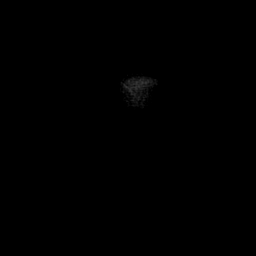

[Series 12: PD fat-sat · sagittal · left · 3.0mm · 0.59mm/px · 7 of 36 slices shown (2 of 2)]
[im 1/36]
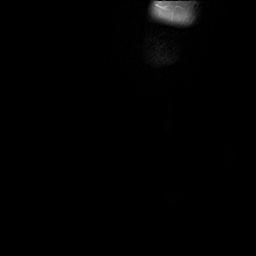
[im 6/36]
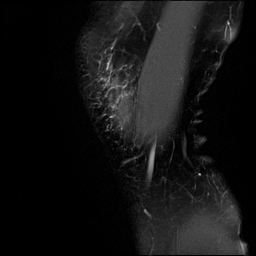
[im 12/36]
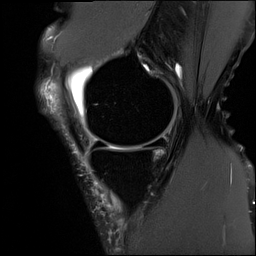
[im 18/36]
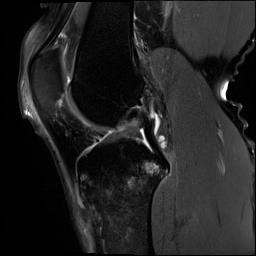
[im 24/36]
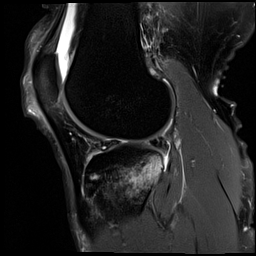
[im 30/36]
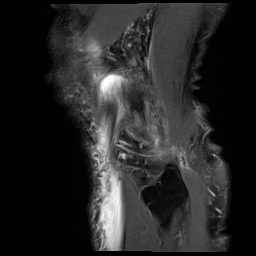
[im 36/36]
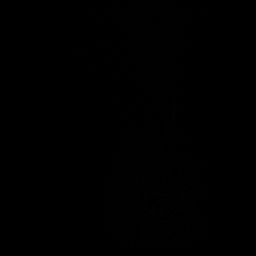

[Series 13: T2 fat-sat · sagittal · left · 3.0mm · 0.59mm/px · 7 of 36 slices shown (3 of 3)]
[im 1/36]
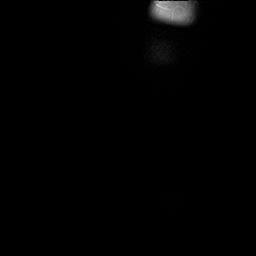
[im 6/36]
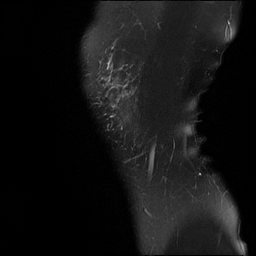
[im 12/36]
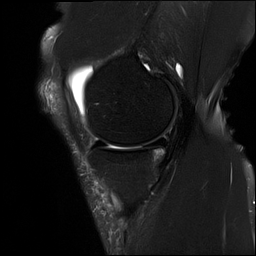
[im 18/36]
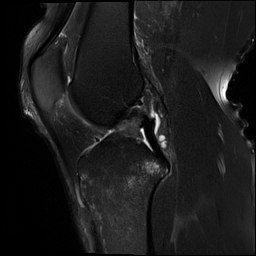
[im 24/36]
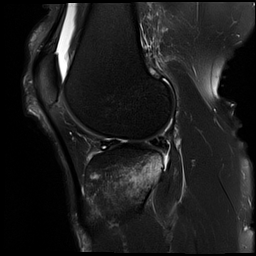
[im 30/36]
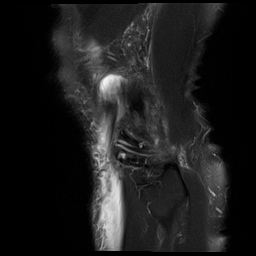
[im 36/36]
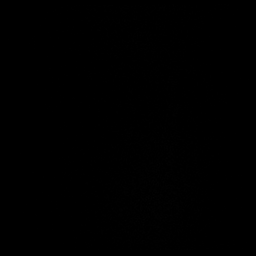

[Series 14: PD · coronal · left · 2.0mm · 0.47mm/px · 3 of 16 slices shown]
[im 1/16]
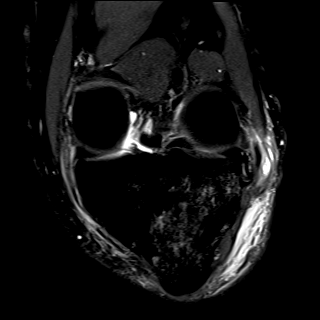
[im 8/16]
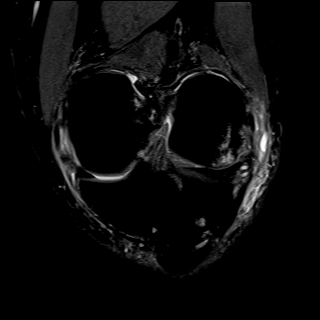
[im 16/16]
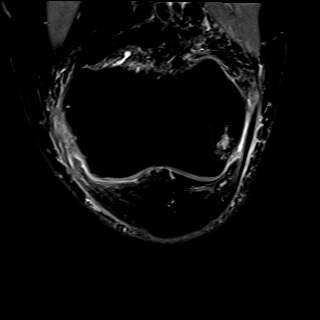

[40 of 40 positions shown; findings below may reference images not displayed]

FINDINGS: MENISCI

Medial meniscus:  Intact.

Lateral meniscus: There are multiple linear areas of increased
proton density signal within the posterior horn of the lateral
meniscus (sagittal images 23-27), a complex tear extending from the
root through the lateral aspect of the posterior horn, and extending
through the superior, inferior, and posterior surfaces of the
meniscal triangle of the posterior horn. There is linear increased
proton density signal within the midsubstance of the peripheral
third of the meniscal triangle of the posterior segment of the body
of the lateral meniscus (coronal image 13), and this appears to
extend into a parameniscal cyst bordering the peripheral aspect of
the junction of the body and anterior horn of the lateral meniscus
(coronal image 18, sagittal image 27). This parameniscal cyst also
connects to fluid bright signal within the inferior aspect of the
peripheral third of the meniscal triangle of the anterior horn of
the lateral meniscus (sagittal images 23 through 28).

LIGAMENTS

Cruciates: There is abnormal increased fluid sensitive signal and
attenuation of the mid to superior ACL, likely a full-thickness
tear. The PCL is intact.

Collaterals: There is intermediate T2 signal and thickening of the
proximal anterior aspect of the medial collateral ligament
suggesting a moderate sprain and possible tiny partial-thickness
tear. No ligament retraction. Mild edema around the fibular
collateral ligament, possibly a mild sprain. The biceps femoris
tendon, iliotibial band, and popliteus tendon are intact.

CARTILAGE

Patellofemoral:  Intact.

Medial:  Intact.

Lateral:  Intact.

Joint: Moderate joint effusion.Normal Hoffa's fat pad. No plical
thickening.

Popliteal Fossa:  No Baker's cyst.

Extensor Mechanism:  Intact quadriceps tendon and patellar tendon.

Bones: There is high-grade marrow edema throughout the posterior
aspect of the lateral tibial plateau with likely up to 3 mm cortical
depression of the far posterolateral aspect (sagittal series 13,
image 27), likely bone marrow contusion with impaction fracture.
Moderate marrow edema within the mid to anterior aspect of the
lateral femoral condyle at the terminal sulcus. This is consistent
with an "acute pivot-shift marrow contusion pattern" commonly
associated with the mechanism of recent ACL injury.

Other: There is mild-to-moderate edema and swelling of the
anterolateral knee subcutaneous fat.
IMPRESSION: 1. Acute to subacute ACL tear with acute pivot-shift marrow
contusion pattern within the lateral tibial plateau and terminal
sulcus of the lateral femoral condyle. There is high-grade marrow
edema within the posterolateral tibial plateau with likely mild
posterior cortical depression. Within the high-grade marrow edema
there are likely nondisplaced "microfractures."
2. Complex tear throughout the posterior horn of the lateral
meniscus. There is a parameniscal cyst bordering the peripheral
aspect of the body and anterior horn of the lateral meniscus.
3. Proximal anteromedial collateral ligament sprain and possible
tiny partial-thickness tear without ligament retraction.

## 2021-05-10 ENCOUNTER — Other Ambulatory Visit: Payer: Self-pay | Admitting: Orthopedic Surgery

## 2021-05-18 ENCOUNTER — Other Ambulatory Visit
Admission: RE | Admit: 2021-05-18 | Discharge: 2021-05-18 | Disposition: A | Discharge status: Home / Self Care | Payer: No Typology Code available for payment source | Source: Ambulatory Visit | Attending: Orthopedic Surgery | Admitting: Orthopedic Surgery

## 2021-05-18 ENCOUNTER — Other Ambulatory Visit: Payer: Self-pay

## 2021-05-18 NOTE — Patient Instructions (Signed)
Your procedure is scheduled on: Monday May 23, 2021. Report to Day Surgery inside [facility] 2nd floor, stop by admissions desk before getting on elevator. To find out your arrival time please call [phone] between 1PM - 3PM on Friday May 20, 2021.  Remember: Instructions that are not followed completely may result in serious medical risk,  up to and including death, or upon the discretion of your surgeon and anesthesiologist your  surgery may need to be rescheduled.     _X__ 1. Do not eat food after midnight the night before your procedure.                 No chewing gum or hard candies. You may drink clear liquids up to 2 hours                 before you are scheduled to arrive for your surgery- DO not drink clear                 liquids within 2 hours of the start of your surgery.                 Clear Liquids include:  water, apple juice without pulp, clear Gatorade, G2 or                  Gatorade Zero (avoid Red/Purple/Blue), Black Coffee or Tea (Do not add                 anything to coffee or tea).  ___X_2.   Complete the "Ensure Clear Pre-surgery Clear Carbohydrate Drink" provided to you, 2 hours before arrival. **If you are diabetic you will be provided with an alternative drink, Gatorade Zero or G2.  __X__3.  On the morning of surgery brush your teeth with toothpaste and water, you                may rinse your mouth with mouthwash if you wish.  Do not swallow any toothpaste or mouthwash.     _X__ 4.  No Alcohol for 24 hours before or after surgery.   _X__ 5.  Do Not Smoke or use e-cigarettes For 24 Hours Prior to Your Surgery.                 Do not use any chewable tobacco products for at least 6 hours prior to                 Surgery.  _X__  6.  Do not use any recreational drugs (marijuana, cocaine, heroin, ecstasy, MDMA or other)                For at least one week prior to your surgery.  Combination of these drugs with anesthesia                 May have life threatening results.  ____  7.  Bring all medications with you on the day of surgery if instructed.   __X_ 8.  Notify your doctor if there is any change in your medical condition      (cold, fever, infections).     Do not wear jewelry, make-up, hairpins, clips or nail polish. Do not wear lotions, powders, or perfumes. You may wear deodorant. Do not shave 48 hours prior to surgery. Men may shave face and neck. Do not bring valuables to the hospital.    [facility] is not responsible for any belongings or valuables.  Contacts,  dentures or bridgework may not be worn into surgery. Leave your suitcase in the car. After surgery it may be brought to your room. For patients admitted to the hospital, discharge time is determined by your treatment team.   Patients discharged the day of surgery will not be allowed to drive home.   Make arrangements for someone to be with you for the first 24 hours of your Same Day Discharge.   __X__ Take these medicines the morning of surgery with A SIP OF WATER:    1. lamoTRIgine (LAMICTAL) 150 MG  2. bictegravir-emtricitabine-tenofovir AF (BIKTARVY) 50-200-25 MG   3.   4.  5.  6.  ____ Fleet Enema (as directed)   __X__ Use CHG Soap (or wipes) as directed  ____ Use Benzoyl Peroxide Gel as instructed  ____ Use inhalers on the day of surgery  ____ Stop metformin 2 days prior to surgery    ____ Take 1/2 of usual insulin dose the night before surgery. No insulin the morning          of surgery.   ____ Call your PCP, cardiologist, or Pulmonologist if taking Coumadin/Plavix/aspirin and ask when to stop before your surgery.   __X__ One Week prior to surgery- Stop Anti-inflammatories such as Ibuprofen, Aleve, Advil, Motrin, meloxicam (MOBIC), diclofenac, etodolac, ketorolac, Toradol, Daypro, piroxicam, Goody's or BC powders. OK TO USE TYLENOL IF NEEDED   __X__ Stop supplements until after surgery.    ____ Bring C-Pap to the  hospital.    If you have any questions regarding your pre-procedure instructions,  Please call Pre-admit Testing at [phone]

## 2021-05-21 ENCOUNTER — Encounter: Payer: Self-pay | Admitting: Infectious Diseases

## 2021-05-23 ENCOUNTER — Ambulatory Visit: Payer: No Typology Code available for payment source

## 2021-05-23 ENCOUNTER — Ambulatory Visit
Admission: RE | Admit: 2021-05-23 | Discharge: 2021-05-23 | Disposition: A | Discharge status: Home / Self Care | Payer: No Typology Code available for payment source | Attending: Orthopedic Surgery | Admitting: Orthopedic Surgery

## 2021-05-23 ENCOUNTER — Encounter: Payer: Self-pay | Admitting: Orthopedic Surgery

## 2021-05-23 ENCOUNTER — Ambulatory Visit: Payer: No Typology Code available for payment source | Admitting: Certified Registered Nurse Anesthetist

## 2021-05-23 ENCOUNTER — Other Ambulatory Visit: Payer: Self-pay

## 2021-05-23 ENCOUNTER — Encounter
Admission: RE | Disposition: A | Discharge status: Home / Self Care | Payer: Self-pay | Source: Home / Self Care | Attending: Orthopedic Surgery

## 2021-05-23 DIAGNOSIS — S83282A Other tear of lateral meniscus, current injury, left knee, initial encounter: Secondary | ICD-10-CM | POA: Insufficient documentation

## 2021-05-23 DIAGNOSIS — Z419 Encounter for procedure for purposes other than remedying health state, unspecified: Secondary | ICD-10-CM

## 2021-05-23 DIAGNOSIS — S83512A Sprain of anterior cruciate ligament of left knee, initial encounter: Secondary | ICD-10-CM | POA: Diagnosis not present

## 2021-05-23 DIAGNOSIS — Y9323 Activity, snow (alpine) (downhill) skiing, snow boarding, sledding, tobogganing and snow tubing: Secondary | ICD-10-CM | POA: Insufficient documentation

## 2021-05-23 DIAGNOSIS — F419 Anxiety disorder, unspecified: Secondary | ICD-10-CM | POA: Diagnosis not present

## 2021-05-23 DIAGNOSIS — X501XXA Overexertion from prolonged static or awkward postures, initial encounter: Secondary | ICD-10-CM | POA: Diagnosis not present

## 2021-05-23 DIAGNOSIS — Z79899 Other long term (current) drug therapy: Secondary | ICD-10-CM | POA: Diagnosis not present

## 2021-05-23 DIAGNOSIS — Z21 Asymptomatic human immunodeficiency virus [HIV] infection status: Secondary | ICD-10-CM | POA: Insufficient documentation

## 2021-05-23 DIAGNOSIS — F319 Bipolar disorder, unspecified: Secondary | ICD-10-CM | POA: Diagnosis not present

## 2021-05-23 HISTORY — PX: KNEE ARTHROSCOPY WITH ANTERIOR CRUCIATE LIGAMENT (ACL) REPAIR WITH HAMSTRING GRAFT: SHX5645

## 2021-05-23 SURGERY — KNEE ARTHROSCOPY WITH ANTERIOR CRUCIATE LIGAMENT (ACL) REPAIR WITH HAMSTRING GRAFT
Anesthesia: Regional | Site: Knee | Laterality: Left

## 2021-05-23 MED ORDER — HYDROMORPHONE HCL 1 MG/ML IJ SOLN
INTRAMUSCULAR | Status: AC
  Administered 2021-05-23: 0.5 mg via INTRAVENOUS
  Filled 2021-05-23: qty 1

## 2021-05-23 MED ORDER — LACTATED RINGERS IV SOLN: INTRAVENOUS | Status: DC

## 2021-05-23 MED ORDER — FENTANYL CITRATE (PF) 100 MCG/2ML IJ SOLN
INTRAMUSCULAR | Status: AC
  Filled 2021-05-23: qty 2

## 2021-05-23 MED ORDER — BUPIVACAINE-EPINEPHRINE (PF) 0.5% -1:200000 IJ SOLN
INTRAMUSCULAR | Status: AC
  Filled 2021-05-23: qty 30

## 2021-05-23 MED ORDER — LIDOCAINE-EPINEPHRINE 1 %-1:100000 IJ SOLN
INTRAMUSCULAR | Status: AC
  Filled 2021-05-23: qty 1

## 2021-05-23 MED ORDER — HYDROMORPHONE HCL 1 MG/ML IJ SOLN
INTRAMUSCULAR | Status: DC | PRN
  Administered 2021-05-23 (×2): .5 mg via INTRAVENOUS

## 2021-05-23 MED ORDER — CEFAZOLIN SODIUM-DEXTROSE 2-4 GM/100ML-% IV SOLN
2.0000 g | INTRAVENOUS | Status: AC
  Administered 2021-05-23: 2 g via INTRAVENOUS

## 2021-05-23 MED ORDER — MIDAZOLAM HCL 2 MG/2ML IJ SOLN: 2.0000 mg | Freq: Once | INTRAMUSCULAR | Status: AC

## 2021-05-23 MED ORDER — ORAL CARE MOUTH RINSE: 15.0000 mL | Freq: Once | OROMUCOSAL | Status: AC

## 2021-05-23 MED ORDER — PROPOFOL 10 MG/ML IV BOLUS
INTRAVENOUS | Status: DC | PRN
Start: 2021-05-23 — End: 2021-05-23
  Administered 2021-05-23: 160 mg via INTRAVENOUS

## 2021-05-23 MED ORDER — HYDROMORPHONE HCL 1 MG/ML IJ SOLN
INTRAMUSCULAR | Status: AC
  Filled 2021-05-23: qty 1

## 2021-05-23 MED ORDER — BUPIVACAINE LIPOSOME 1.3 % IJ SUSP
INTRAMUSCULAR | Status: DC | PRN
  Administered 2021-05-23: 27 mL

## 2021-05-23 MED ORDER — ACETAMINOPHEN 500 MG PO TABS: 1000.0000 mg | ORAL_TABLET | Freq: Three times a day (TID) | ORAL | 2 refills | Status: DC

## 2021-05-23 MED ORDER — SUCCINYLCHOLINE CHLORIDE 200 MG/10ML IV SOSY: PREFILLED_SYRINGE | INTRAVENOUS | Status: DC | PRN

## 2021-05-23 MED ORDER — ACETAMINOPHEN 10 MG/ML IV SOLN: 1000.0000 mg | Freq: Once | INTRAVENOUS | Status: DC | PRN

## 2021-05-23 MED ORDER — MIDAZOLAM HCL 2 MG/2ML IJ SOLN
INTRAMUSCULAR | Status: AC
  Filled 2021-05-23: qty 2

## 2021-05-23 MED ORDER — GLYCOPYRROLATE 0.2 MG/ML IJ SOLN
INTRAMUSCULAR | Status: AC
  Filled 2021-05-23: qty 1

## 2021-05-23 MED ORDER — OXYCODONE HCL 5 MG PO TABS: 5.0000 mg | ORAL_TABLET | ORAL | 0 refills | Status: DC | PRN

## 2021-05-23 MED ORDER — FAMOTIDINE 20 MG PO TABS
ORAL_TABLET | ORAL | Status: AC
  Administered 2021-05-23: 20 mg via ORAL
  Filled 2021-05-23: qty 1

## 2021-05-23 MED ORDER — LIDOCAINE HCL (CARDIAC) PF 100 MG/5ML IV SOSY
PREFILLED_SYRINGE | INTRAVENOUS | Status: DC | PRN
  Administered 2021-05-23: 100 mg via INTRAVENOUS

## 2021-05-23 MED ORDER — LACTATED RINGERS IR SOLN
Status: DC | PRN
  Administered 2021-05-23 (×4): 3000 mL

## 2021-05-23 MED ORDER — LACTATED RINGERS IV SOLN
INTRAVENOUS | Status: DC | PRN
  Administered 2021-05-23 (×4): 3001 mL

## 2021-05-23 MED ORDER — OXYCODONE HCL 5 MG PO TABS
5.0000 mg | ORAL_TABLET | Freq: Once | ORAL | Status: AC | PRN
  Administered 2021-05-23: 5 mg via ORAL

## 2021-05-23 MED ORDER — 0.9 % SODIUM CHLORIDE (POUR BTL) OPTIME
TOPICAL | Status: DC | PRN
  Administered 2021-05-23: 500 mL

## 2021-05-23 MED ORDER — VANCOMYCIN HCL 1000 MG IV SOLR
INTRAVENOUS | Status: AC
  Filled 2021-05-23: qty 20

## 2021-05-23 MED ORDER — CHLORHEXIDINE GLUCONATE 0.12 % MT SOLN
OROMUCOSAL | Status: AC
  Administered 2021-05-23: 15 mL via OROMUCOSAL
  Filled 2021-05-23: qty 15

## 2021-05-23 MED ORDER — BUPIVACAINE LIPOSOME 1.3 % IJ SUSP
INTRAMUSCULAR | Status: AC
  Filled 2021-05-23: qty 20

## 2021-05-23 MED ORDER — BUPIVACAINE HCL (PF) 0.5 % IJ SOLN
INTRAMUSCULAR | Status: AC
  Filled 2021-05-23: qty 10

## 2021-05-23 MED ORDER — ROCURONIUM BROMIDE 100 MG/10ML IV SOLN
INTRAVENOUS | Status: DC | PRN
  Administered 2021-05-23: 50 mg via INTRAVENOUS
  Administered 2021-05-23: 30 mg via INTRAVENOUS
  Administered 2021-05-23 (×3): 20 mg via INTRAVENOUS

## 2021-05-23 MED ORDER — ROCURONIUM BROMIDE 10 MG/ML (PF) SYRINGE
PREFILLED_SYRINGE | INTRAVENOUS | Status: AC
  Filled 2021-05-23: qty 10

## 2021-05-23 MED ORDER — SUGAMMADEX SODIUM 200 MG/2ML IV SOLN
INTRAVENOUS | Status: DC | PRN
  Administered 2021-05-23: 163.2 mg via INTRAVENOUS

## 2021-05-23 MED ORDER — BUPIVACAINE HCL (PF) 0.5 % IJ SOLN
INTRAMUSCULAR | Status: DC | PRN
Start: 2021-05-23 — End: 2021-05-23
  Administered 2021-05-23: 20 mL

## 2021-05-23 MED ORDER — FAMOTIDINE 20 MG PO TABS: 20.0000 mg | ORAL_TABLET | Freq: Once | ORAL | Status: AC

## 2021-05-23 MED ORDER — FENTANYL CITRATE PF 50 MCG/ML IJ SOSY
50.0000 ug | PREFILLED_SYRINGE | INTRAMUSCULAR | Status: AC | PRN
  Administered 2021-05-23: 50 ug via INTRAVENOUS

## 2021-05-23 MED ORDER — MIDAZOLAM HCL 2 MG/2ML IJ SOLN
INTRAMUSCULAR | Status: AC
  Administered 2021-05-23: 2 mg via INTRAVENOUS
  Filled 2021-05-23: qty 2

## 2021-05-23 MED ORDER — IBUPROFEN 800 MG PO TABS
800.0000 mg | ORAL_TABLET | Freq: Three times a day (TID) | ORAL | 1 refills | Status: AC
Start: 2021-05-23 — End: 2021-06-06

## 2021-05-23 MED ORDER — GABAPENTIN 300 MG PO CAPS: 300.0000 mg | ORAL_CAPSULE | Freq: Three times a day (TID) | ORAL | 0 refills | Status: DC

## 2021-05-23 MED ORDER — FENTANYL CITRATE (PF) 100 MCG/2ML IJ SOLN
INTRAMUSCULAR | Status: AC
  Administered 2021-05-23: 50 ug via INTRAVENOUS
  Filled 2021-05-23: qty 2

## 2021-05-23 MED ORDER — FENTANYL CITRATE (PF) 100 MCG/2ML IJ SOLN
25.0000 ug | INTRAMUSCULAR | Status: DC | PRN
  Administered 2021-05-23 (×2): 50 ug via INTRAVENOUS

## 2021-05-23 MED ORDER — EPINEPHRINE PF 1 MG/ML IJ SOLN
INTRAMUSCULAR | Status: AC
  Filled 2021-05-23: qty 4

## 2021-05-23 MED ORDER — ONDANSETRON HCL 4 MG/2ML IJ SOLN: 4.0000 mg | Freq: Once | INTRAMUSCULAR | Status: DC | PRN

## 2021-05-23 MED ORDER — ASPIRIN EC 325 MG PO TBEC: 325.0000 mg | DELAYED_RELEASE_TABLET | Freq: Every day | ORAL | 0 refills | Status: AC

## 2021-05-23 MED ORDER — ONDANSETRON HCL 4 MG/2ML IJ SOLN
INTRAMUSCULAR | Status: AC
  Filled 2021-05-23: qty 2

## 2021-05-23 MED ORDER — OXYCODONE HCL 5 MG PO TABS
ORAL_TABLET | ORAL | Status: AC
  Filled 2021-05-23: qty 1

## 2021-05-23 MED ORDER — ACETAMINOPHEN 10 MG/ML IV SOLN
INTRAVENOUS | Status: AC
  Filled 2021-05-23: qty 100

## 2021-05-23 MED ORDER — FENTANYL CITRATE PF 50 MCG/ML IJ SOSY
PREFILLED_SYRINGE | INTRAMUSCULAR | Status: AC
  Administered 2021-05-23: 50 ug via INTRAVENOUS
  Filled 2021-05-23: qty 2

## 2021-05-23 MED ORDER — ACETAMINOPHEN 10 MG/ML IV SOLN
INTRAVENOUS | Status: DC | PRN
Start: 2021-05-23 — End: 2021-05-23
  Administered 2021-05-23: 1000 mg via INTRAVENOUS

## 2021-05-23 MED ORDER — CHLORHEXIDINE GLUCONATE 0.12 % MT SOLN
15.0000 mL | Freq: Once | OROMUCOSAL | Status: AC
Start: 2021-05-23 — End: 2021-05-23

## 2021-05-23 MED ORDER — MIDAZOLAM HCL 2 MG/2ML IJ SOLN: 1.0000 mg | INTRAMUSCULAR | Status: DC | PRN

## 2021-05-23 MED ORDER — CEFAZOLIN SODIUM-DEXTROSE 2-4 GM/100ML-% IV SOLN
INTRAVENOUS | Status: AC
  Filled 2021-05-23: qty 100

## 2021-05-23 MED ORDER — LIDOCAINE HCL (PF) 2 % IJ SOLN
INTRAMUSCULAR | Status: AC
  Filled 2021-05-23: qty 5

## 2021-05-23 MED ORDER — LIDOCAINE HCL (PF) 1 % IJ SOLN
INTRAMUSCULAR | Status: AC
  Filled 2021-05-23: qty 30

## 2021-05-23 MED ORDER — DEXAMETHASONE SODIUM PHOSPHATE 10 MG/ML IJ SOLN
INTRAMUSCULAR | Status: DC | PRN
Start: 2021-05-23 — End: 2021-05-23
  Administered 2021-05-23: 10 mg via INTRAVENOUS

## 2021-05-23 MED ORDER — PROPOFOL 10 MG/ML IV BOLUS
INTRAVENOUS | Status: AC
  Filled 2021-05-23: qty 40

## 2021-05-23 MED ORDER — ONDANSETRON HCL 4 MG/2ML IJ SOLN
INTRAMUSCULAR | Status: DC | PRN
Start: 2021-05-23 — End: 2021-05-23
  Administered 2021-05-23: 4 mg via INTRAVENOUS

## 2021-05-23 MED ORDER — DIAZEPAM 5 MG PO TABS: 5.0000 mg | ORAL_TABLET | Freq: Three times a day (TID) | ORAL | 0 refills | Status: DC | PRN

## 2021-05-23 MED ORDER — HYDROMORPHONE HCL 1 MG/ML IJ SOLN
0.5000 mg | INTRAMUSCULAR | Status: AC | PRN
  Administered 2021-05-23 (×2): 0.5 mg via INTRAVENOUS

## 2021-05-23 MED ORDER — OXYCODONE HCL 5 MG/5ML PO SOLN: 5.0000 mg | Freq: Once | ORAL | Status: AC | PRN

## 2021-05-23 MED ORDER — FENTANYL CITRATE (PF) 100 MCG/2ML IJ SOLN
INTRAMUSCULAR | Status: DC | PRN
  Administered 2021-05-23 (×2): 50 ug via INTRAVENOUS

## 2021-05-23 MED ORDER — ONDANSETRON 4 MG PO TBDP: 4.0000 mg | ORAL_TABLET | Freq: Three times a day (TID) | ORAL | 0 refills | Status: DC | PRN

## 2021-05-23 MED ORDER — BUPIVACAINE HCL (PF) 0.5 % IJ SOLN
INTRAMUSCULAR | Status: AC
  Filled 2021-05-23: qty 30

## 2021-05-23 MED ORDER — VANCOMYCIN HCL 1000 MG IV SOLR
INTRAVENOUS | Status: DC | PRN
  Administered 2021-05-23: 1000 mg

## 2021-05-23 MED ORDER — DEXAMETHASONE SODIUM PHOSPHATE 10 MG/ML IJ SOLN
INTRAMUSCULAR | Status: AC
  Filled 2021-05-23: qty 1

## 2021-05-23 SURGICAL SUPPLY — 96 items
ADAPTER IRRIG TUBE 2 SPIKE SOL (ADAPTER) ×4 IMPLANT
ADPR TBG 2 SPK PMP STRL ASCP (ADAPTER) ×2
ANCHOR BUTTON TIGHTROPE RC 20 (Anchor) ×1 IMPLANT
APL PRP STRL LF DISP 70% ISPRP (MISCELLANEOUS) ×2
BASIN GRAD PLASTIC 32OZ STRL (MISCELLANEOUS) ×2 IMPLANT
BLADE SHAVER 4.5X7 STR FR (MISCELLANEOUS) ×2 IMPLANT
BLADE SURG 15 STRL LF DISP TIS (BLADE) ×2 IMPLANT
BLADE SURG 15 STRL SS (BLADE) ×4
BLADE SURG SZ10 CARB STEEL (BLADE) ×2 IMPLANT
BLADE SURG SZ11 CARB STEEL (BLADE) ×2 IMPLANT
BNDG COHESIVE 4X5 TAN ST LF (GAUZE/BANDAGES/DRESSINGS) ×2 IMPLANT
BNDG COHESIVE 6X5 TAN ST LF (GAUZE/BANDAGES/DRESSINGS) ×2 IMPLANT
BNDG ESMARK 6X12 TAN STRL LF (GAUZE/BANDAGES/DRESSINGS) ×2 IMPLANT
BRUSH SCRUB EZ  4% CHG (MISCELLANEOUS) ×1
BRUSH SCRUB EZ 4% CHG (MISCELLANEOUS) ×1 IMPLANT
BUR BR 5.5 WIDE MOUTH (BURR) ×1 IMPLANT
CARTRIDGE SUT 2-0 NONSTITCH (Anchor) ×4 IMPLANT
CHLORAPREP W/TINT 26 (MISCELLANEOUS) ×4 IMPLANT
CLEANER CAUTERY TIP 5X5 PAD (MISCELLANEOUS) ×1 IMPLANT
COOLER POLAR GLACIER W/PUMP (MISCELLANEOUS) ×2 IMPLANT
COVER BACK TABLE REUSABLE LG (DRAPES) ×2 IMPLANT
CUFF TOURN SGL QUICK 24 (TOURNIQUET CUFF)
CUFF TOURN SGL QUICK 34 (TOURNIQUET CUFF)
CUFF TRNQT CYL 24X4X16.5-23 (TOURNIQUET CUFF) IMPLANT
CUFF TRNQT CYL 34X4.125X (TOURNIQUET CUFF) IMPLANT
DRAPE 3/4 80X56 (DRAPES) ×4 IMPLANT
DRAPE ARTHRO LIMB 89X125 STRL (DRAPES) ×4 IMPLANT
DRAPE FLUOR MINI C-ARM 54X84 (DRAPES) ×2 IMPLANT
DRAPE IMP U-DRAPE 54X76 (DRAPES) ×2 IMPLANT
DRAPE ORTHO SPLIT 77X108 STRL (DRAPES) ×2
DRAPE POUCH INSTRU U-SHP 10X18 (DRAPES) ×2 IMPLANT
DRAPE SURG ORHT 6 SPLT 77X108 (DRAPES) ×1 IMPLANT
DRILL FLIPCUTTER III 6-12 (ORTHOPEDIC DISPOSABLE SUPPLIES) IMPLANT
ELECT REM PT RETURN 9FT ADLT (ELECTROSURGICAL) ×2
ELECTRODE REM PT RTRN 9FT ADLT (ELECTROSURGICAL) ×1 IMPLANT
FLIPCUTTER III 6-12 AR-1204FF (ORTHOPEDIC DISPOSABLE SUPPLIES) ×2
GAUZE SPONGE 4X4 12PLY STRL (GAUZE/BANDAGES/DRESSINGS) ×2 IMPLANT
GAUZE XEROFORM 1X8 LF (GAUZE/BANDAGES/DRESSINGS) ×2 IMPLANT
GLOVE SRG 8 PF TXTR STRL LF DI (GLOVE) ×1 IMPLANT
GLOVE SURG SYN 8.0 (GLOVE) ×2 IMPLANT
GLOVE SURG SYN 8.0 PF PI (GLOVE) ×1 IMPLANT
GLOVE SURG UNDER POLY LF SZ8 (GLOVE) ×2
GOWN STRL REUS W/ TWL LRG LVL3 (GOWN DISPOSABLE) ×1 IMPLANT
GOWN STRL REUS W/ TWL XL LVL3 (GOWN DISPOSABLE) ×1 IMPLANT
GOWN STRL REUS W/TWL LRG LVL3 (GOWN DISPOSABLE) ×2
GOWN STRL REUS W/TWL XL LVL3 (GOWN DISPOSABLE) ×2
GRADUATE 1200CC STRL 31836 (MISCELLANEOUS) ×2 IMPLANT
GUIDEWIRE 1.2MMX18 (WIRE) ×2 IMPLANT
HANDLE YANKAUER SUCT BULB TIP (MISCELLANEOUS) ×2 IMPLANT
IMP SYS 2ND FIX PEEK 4.75X19.1 (Miscellaneous) ×2 IMPLANT
IMPL SYS 2ND FX PEEK 4.75X19.1 (Miscellaneous) IMPLANT
IMPL TIGHTROP ABS ACL FIBERTG (Orthopedic Implant) IMPLANT
IMPL TIGHTROP FIBERTAG ACL (Orthopedic Implant) IMPLANT
IMPL TIGHTROPE ABS ACL FIBERTG (Orthopedic Implant) ×2 IMPLANT
IMPLANT TIGHTROPE FIBERTAG ACL (Orthopedic Implant) ×2 IMPLANT
IV LACTATED RINGER IRRG 3000ML (IV SOLUTION) ×12
IV LR IRRIG 3000ML ARTHROMATIC (IV SOLUTION) ×6 IMPLANT
KIT TRANSTIBIAL (DISPOSABLE) ×1 IMPLANT
KIT TURNOVER KIT A (KITS) ×2 IMPLANT
KNIFE BLADE PARALLEL SZ10 (BLADE) ×1 IMPLANT
MANAGER SUT NOVOCUT (CUTTER) ×1 IMPLANT
MANIFOLD NEPTUNE II (INSTRUMENTS) ×4 IMPLANT
MAT ABSORB  FLUID 56X50 GRAY (MISCELLANEOUS) ×2
MAT ABSORB FLUID 56X50 GRAY (MISCELLANEOUS) ×2 IMPLANT
NEEDLE HYPO 22GX1.5 SAFETY (NEEDLE) ×2 IMPLANT
NOVOSTICH PRO MENISCAL 2-0 (Miscellaneous) ×2 IMPLANT
PACK ARTHROSCOPY KNEE (MISCELLANEOUS) ×2 IMPLANT
PAD ABD DERMACEA PRESS 5X9 (GAUZE/BANDAGES/DRESSINGS) ×4 IMPLANT
PAD CLEANER CAUTERY TIP 5X5 (MISCELLANEOUS) ×1
PAD WRAPON POLAR KNEE (MISCELLANEOUS) ×1 IMPLANT
PENCIL ELECTRO HAND CTR (MISCELLANEOUS) ×2 IMPLANT
PENCIL SMOKE EVACUATOR (MISCELLANEOUS) ×2 IMPLANT
REAMER LOW PROFILE 11MM (INSTRUMENTS) ×1 IMPLANT
SHAVER BLADE BONE CUTTER  5.5 (BLADE)
SHAVER BLADE BONE CUTTER 5.5 (BLADE) IMPLANT
SPONGE T-LAP 18X18 ~~LOC~~+RFID (SPONGE) ×6 IMPLANT
STRIP CLOSURE SKIN 1/2X4 (GAUZE/BANDAGES/DRESSINGS) ×2 IMPLANT
SUCTION FRAZIER HANDLE 10FR (MISCELLANEOUS)
SUCTION TUBE FRAZIER 10FR DISP (MISCELLANEOUS) IMPLANT
SUT ETHILON 3-0 FS-10 30 BLK (SUTURE) ×2
SUT FIBERWIRE #2 38 T-5 BLUE (SUTURE) ×4
SUT MNCRL AB 4-0 PS2 18 (SUTURE) ×2 IMPLANT
SUT VIC AB 0 CT1 36 (SUTURE) ×2 IMPLANT
SUT VIC AB 2-0 CT1 27 (SUTURE) ×2
SUT VIC AB 2-0 CT1 TAPERPNT 27 (SUTURE) ×1 IMPLANT
SUT VIC AB 2-0 CT2 27 (SUTURE) ×1 IMPLANT
SUTURE EHLN 3-0 FS-10 30 BLK (SUTURE) ×1 IMPLANT
SUTURE FIBERWR #2 38 T-5 BLUE (SUTURE) ×2 IMPLANT
SYR BULB IRRIG 60ML STRL (SYRINGE) ×2 IMPLANT
SYSTEM NVSTCH PRO MENISCAL 2-0 (Miscellaneous) IMPLANT
TRAY FOLEY SLVR 16FR LF STAT (SET/KITS/TRAYS/PACK) ×1 IMPLANT
TUBING INFLOW SET DBFLO PUMP (TUBING) ×2 IMPLANT
TUBING OUTFLOW SET DBLFO PUMP (TUBING) ×2 IMPLANT
WAND WEREWOLF FLOW 90D (MISCELLANEOUS) ×1 IMPLANT
WATER STERILE IRR 500ML POUR (IV SOLUTION) ×1 IMPLANT
WRAPON POLAR PAD KNEE (MISCELLANEOUS) ×2

## 2021-05-23 NOTE — Op Note (Signed)
Operative Note    SURGERY DATE: 05/23/2021   PRE-OP DIAGNOSIS:  1. Left knee anterior cruciate ligament tear 2. Left lateral meniscus tear   POST-OP DIAGNOSIS:  1. Left knee anterior cruciate ligament tear 2. Left lateral meniscus tear 3. Left medial femoral condyle chondral defect  PROCEDURES:  1. Left knee anterior cruciate ligament reconstruction with quadriceps tendon autograft 2. Left lateral meniscus repair 3. Left medial femoral condyle chondroplasty   SURGEON: Rosealee Albee, MD  ASSISTANT: Dedra Skeens, PA   ANESTHESIA: Gen + adductor canal nerve block   ESTIMATED BLOOD LOSS: 10cc   TOTAL IV FLUIDS: per anesthesia  INDICATION(S):  Darrell Kennedy is a 41 y.o. male who i recently had a skiing injury on 04/15/2021.  He had a twisting injury to his knee after a fall.  He has had persistent instability sensations since that time.  Of note, he did have a prior injury approximately 5 years ago where he had a pop and noted swelling in the knee with similar sensations.  At that time, he did not have insurance and did not seek medical attention as his symptoms quickly improved and he was able to return to all desired activities.  Recent MRI showed an ACL tear and complex lateral meniscus tear, which was consistent with the clinical exam.  We discussed risks of surgery including but not limited to possible ACL and/or meniscus re-tear, infection, bleeding, muscle/nerve damage, DVT, complications of anesthesia, and postoperative knee pain and arthrofibrosis. After discussion of risks, benefits, and alternatives to surgery, the patient elected to proceed.    OPERATIVE FINDINGS:    Examination under anesthesia: A careful examination under anesthesia was performed.  Passive range of motion was: Hyperextension: 3.  Extension: 0.  Flexion: 140.  Lachman: 2B. Pivot Shift: grade 2.  Posterior drawer: normal.  Varus stability in full extension: normal.  Varus stability in 30 degrees of flexion:  normal.  Valgus stability in full extension: normal.  Valgus stability in 30 degrees of flexion: normal.   Intra-operative findings: A thorough arthroscopic examination of the knee was performed.  The findings are: 1. Suprapatellar pouch: Normal 2. Undersurface of median ridge: Normal 3. Medial patellar facet: Normal 4. Lateral patellar facet: Normal 5. Trochlea: Normal 6. Lateral gutter/popliteus tendon: Normal 7. Hoffa's fat pad: Normal 8. Medial gutter/plica: Normal 9. ACL: Abnormal: complete femoral avulsion 10. PCL: Normal 11. Medial meniscus: Normal 12. Medial compartment cartilage: Focal, approximately 10 x 10 mm mostly grade grade 2 defect with few areas of grade 3 extension 13. Lateral meniscus: complex tear of the lateral meniscus.  Primary tear pattern was horizontal from the posterior horn extending to the body/anterior horn junction.  There was an additional vertical tear medial to the component of the tear just lateral to the meniscus root involving the red-white zone.  Furthermore, there was a parrot-beak component of the posterior horn/body with a completely flipped fragment in the posterior tibial recess.  This flipped, degenerative fragment was resected with remainder of the tear repaired.  14. Lateral compartment cartilage: Scattered grade 1 changes to the lateral femoral condyle   OPERATIVE REPORT:    I identified Darrell Kennedy in the pre-operative holding area.  I marked the operative knee with my initials. I reviewed the risks and benefits of the proposed surgical intervention and the patient wished to proceed.  Anesthesia was then performed with an adductor canal nerve block.  The patient was transferred to the operative suite and placed in the supine  position with all bony prominences padded.  Care was taken to ensure that the contralateral leg was placed in neutral position and that the operative leg was well-padded in the leg holder.     Appropriate IV antibiotics  were administered within 30 minutes of incision. The extremity was then prepped and draped in standard fashion. A time out was performed confirming the correct extremity, correct patient and correct procedure.   Given the clear presence of a pivot shift on examination under anesthesia, I first directed my attention to the harvest of a quadriceps autograft.  The right lower extremity was exsanguinated with an Esmarch, and a thigh tourniquet was elevated to 250 mmHg.  The total tourniquet time for this case was 130 minutes.     A 3cm incision was planned just proximal to the proximal pole of the patella.  The incision was made with a 15 blade, and subcutaneous fat was sharply excised to expose the quadriceps tendon.  The paratenon was sharply incised, and the space in between the paratenon and the quadriceps tendon was bluntly developed with a sponge and a key elevator.  A speculum retractor was placed anteriorly, and the quadriceps was easily visualized with the arthroscope.  The vastus lateralis and VMO were clearly identified, as was the junction of the rectus femoris muscle with the proximal aspect of the quadriceps tendon.  Under direct visualization with the arthroscope, an Arthrex 10 mm parallel blade was used to incise the quadriceps tendon from its most proximal extent, to the junction with the patella.  Care was taken not to violate the rectus femoris muscle.  Then, using a 15 blade, the graft was transected and elevated distally off the patella, creating a 7 mm thick partial thickness graft.  Dissection was carried proximally to create a uniformly thick graft, 81mm in length.  The distal end of the graft was controlled with a #2 Fiberwire stitch, and the Arthrex quadriceps harvester/cutter was loaded over the graft.  At a length of 65 mm, the harvest/cutter was used to transect the graft proximally, and the graft was removed from the wound.  The arthroscope was used to confirm a partial thickness  harvest with no violation of the anterior knee capsule.     On the back table, the graft was prepared in standard fashion.  The length of the graft was 65 mm.  Each end was prepared using an Warden/ranger.  The femoral end was secured around a TightRope RT, and the tibial end was secured around an ABS loop.  The femoral end of the graft was 62mm in diameter, the tibial end was 69mm in diameter.  The graft was tensioned to 20 lbs and reserved for later use. Of note, it was soaked in 5 mg/mL vancomycin solution to reduce risk of infection for at least 20 minutes prior to implantation.   Standard anterolateral portals was created with an 11-blade.  The arthroscope was introduced through the anterolateral portal, and a full diagnostic arthroscopy was performed as described above.   An anteromedial portal was made under needle localization. A shaver was introduced through the anteromedial portal and used to gently debride the fat pad to improve visualization. Then the ACL remnant was debrided using the shaver, leaving 1-2 mm stumps on the tibia for anatomic referencing.  The lateral meniscus was addressed first.  A probe was used to reduce the flipped fragment of the lateral meniscus.  This was significantly degenerative and bulbous and was not amenable to  repair.  Therefore it was debrided using arthroscopic biters and an oscillating shaver.  Next, A rasp was used to roughen the capsular tissue about the meniscus as well as the region of vertical and horizontal tearing as described above. A shaver was then used to debride the horizontal split component of the tear so that there were freshened meniscus edges.  First, the vertical tear adjacent to the meniscus root was addressed by placing a side-to-side mattress stitch across this tear using A Ceterix Novostitch device.  An arthroscopic knot was tied to reduce this component of the tear.  Next, horizontal haybale sutures x4 were placed and tied using  the Ceterix device across the posterior horn, body, and body/anterior horn junction to reduce the horizontal component of the tear.  Of note, an accessory far medial portal was established under needle localization to place the anteriormost stitch at the anterior horn/body junction.  The tear was probed afterwards and found to be stable and anatomically reduced.  Next, a gentle chondroplasty of the medial femoral condyle was performed with an oscillating shaver to stabilize the cartilaginous edges.   Next, a burr was used to perform a gentle notchplasty.  I  then created the femoral socket. This was performed with an outside-in technique using an Teacher, English as a foreign languageArthrex FlipCutter. The retrograde femoral guide was utilized. We made the lateral stab incision with a 15 blade and followed the angle of the drill sleeve to make the incision through the IT band down to bone. The drill sleeve was pushed down to bone on the lateral femoral condyle and the guide was placed on the anatomic footprint of the ACL. A 10mm tunnel 22mm in length was drilled. We then used a FiberStick to pass a suture through the femoral tunnel and out of the anteromedial portal.    I then directed my attention to preparation of the tibial tunnel. A tibial guide set at 60 degrees was inserted through the anteromedial portal and centered over the tibial footprint.  The drill sleeve was then advanced to the proximal medial tibia just at the junction of the tibial tubercle and the pes tendon, through a ~3cm incision.  The anticipated tunnel length was 42mm.  A guide pin was then drilled through the proximal tibia under direct arthroscopic visualization into the center of the ACL footprint.  This was then over-reamed with an Arthrex 11mm reamer. Dilators were used to dilate the tibial tunnel.  Bony debris and soft tissue was cleared from the metaphyseal opening with electrocautery and from the intra-articular aperture with a shaver.   The passing suture was then  brought out of the tibial tunnel.  The graft was then advanced into place in standard fashion.  The femoral Tight Rope was deployed on the lateral cortex under direct arthroscopic visualization from the anteromedial portal.  Correct position on the lateral cortex was confirmed fluoroscopically.  The TightRope was then shortened until at least 20 mm of graft was in the femoral tunnel.     I then directed my attention to tibial fixation.  This was performed with the knee in full extension with an axial and posterior drawer load applied to the tibia.  A 20mm ABS concave button was loaded over the ABS loop, and the loop was shortened until the button was flush with the anteromedial tibial cortex. The knee was then cycled 20 times, and both the femoral and tibial button were tightened as much as possible with the knee in full extension. The tibial sutures were  tightened, and a knot was placed over the button.  A hole for a 4.75 mm SwiveLock was drilled approximately 2 cm distal to the tibial tunnel.  The ends of the tibial sutures were placed under tension and the anchor was advanced.  This served as a back-up tibial fixation.   A repeat examination under anesthesia was performed.  The patient retained full hyperextension and flexion.  The Lachman's was normalized.  The arthroscope was re-introduced into the knee joint, confirming excellent position and tension of the quadriceps autograft.  There was no lateral wall or roof impingement.  Tibial and femoral sutures were cut.   The wounds were irrigated. 2-0 Vicryl was used to close the quadriceps paratenon.  0 Vicryl was used to close the sartorius fascia.  2-0 Vicryl was used to close the subdermal layers of both the quadriceps tendon harvest and the tibial tunnel incisions.  The harvest incision and the proximal medial tibial incisions were then closed with 4-0 Monocryl and Dermabond.  The arthroscopy portals and lateral femoral incision were closed with 3-0  Nylon.  A sterile dressing was applied, followed by a Polar Care device and a hinged knee brace locked in full extension.   The patient was awakened from anesthesia without difficulty and was transferred to the PACU in stable condition.   Of note, assistance from a PA was essential to performing the surgery.  PA was present for the entire surgery.  PA assisted with patient positioning, retraction, instrumentation, and wound closure. The surgery would have been more difficult and had longer operative time without PA assistance.    Additionally, this case had increased complexity compared to standard lateral meniscus repair given the complexity of the tear with 3 different tear patterns as described above.  Repair of this tear involved use of a total of 5 stitches in different configurations and necessitated use of additional arthroscopic portal for appropriate placement of repair stitches. The steps increased surgical time by approximately 20 minutes.   POSTOPERATIVE PLAN: The patient will be discharged home today once they meet PACU criteria. FFWB x 4 weeks. WBAT with crutches from weeks 4-6. Aspirin 325 mg daily for 2 weeks for DVT prophylaxis. Start physical therapy on POD#3-4. Follow up in 2 weeks per protocol.

## 2021-05-23 NOTE — Anesthesia Procedure Notes (Signed)
Anesthesia Regional Block: Adductor canal block   Pre-Anesthetic Checklist: , timeout performed,  Correct Patient, Correct Site, Correct Laterality,  Correct Procedure,, site marked,  Risks and benefits discussed,  Surgical consent,  Pre-op evaluation,  At surgeon's request and post-op pain management  Laterality: Left  Prep: chloraprep       Needles:  Injection technique: Single-shot  Needle Type: Echogenic Needle          Additional Needles:   Procedures:,,,, ultrasound used (permanent image in chart),,   Motor weakness within 20 minutes.  Narrative:  Start time: 05/23/2021 9:23 AM End time: 05/23/2021 9:24 AM Injection made incrementally with aspirations every 5 mL.  Performed by: Personally  Anesthesiologist: Reed Breech, MD  Additional Notes: Functioning IV was confirmed and monitors applied.  Sterile prep and drape, hand hygiene and sterile gloves were used. Ultrasound guidance: relevant anatomy identified, needle position confirmed, local anesthetic spread visualized around nerve(s), vascular puncture avoided.  Image saved to electronic medical record.  Negative aspiration prior to incremental administration of local anesthetic for total 20 ml bupivacaine 0.5% given in adductor saphenous distribution. The patient tolerated the procedure well. Vital signs and moderate sedation medications recorded in RN notes.

## 2021-05-23 NOTE — Anesthesia Preprocedure Evaluation (Addendum)
Anesthesia Evaluation  Patient identified by MRN, date of birth, ID band Patient awake    Reviewed: Allergy & Precautions, NPO status , Patient's Chart, lab work & pertinent test results  History of Anesthesia Complications Negative for: history of anesthetic complications  Airway Mallampati: I   Neck ROM: full    Dental   Upper permanent bridge:   Pulmonary sleep apnea ,    Pulmonary exam normal breath sounds clear to auscultation       Cardiovascular Exercise Tolerance: Good Normal cardiovascular exam Rhythm:Regular Rate:Normal  02/03/21: EKG NSR  ECHO 11/20: 1. Left ventricular ejection fraction, by visual estimation, is 55 to 60%. The left ventricle has normal function. There is no left ventricular hypertrophy.  2. Global right ventricle has normal systolic function.The right ventricular size is normal. No increase in right ventricular wall thickness.  3. Left atrial size was normal.  4. Right atrial size was normal.  5. The mitral valve is normal in structure. Trace mitral valve regurgitation.  6. The tricuspid valve is normal in structure. Tricuspid valve regurgitation is trivial.  7. The aortic valve is normal in structure. Aortic valve regurgitation is not visualized.  8. The pulmonic valve was grossly normal. Pulmonic valve regurgitation is trivial.  9. Evidence of atrial level shunting detected by color flow Doppler.   Neuro/Psych PSYCHIATRIC DISORDERS Anxiety Depression Bipolar Disorder TIA (oct 2020 Neg MRI, ECHO, TEE which did not show any intracardiac shunting)   GI/Hepatic negative GI ROS,   Endo/Other  negative endocrine ROS  Renal/GU negative Renal ROS     Musculoskeletal Rupture of anterior cruicate ligament of left knee   Abdominal   Peds  Hematology  (+) HIV (on Biktarvy- 100% adherent. Last Vl 30 and Cd4 >800 from June 2022),   Anesthesia Other Findings    Reproductive/Obstetrics negative OB ROS                           Anesthesia Physical Anesthesia Plan  ASA: 2  Anesthesia Plan: General and Regional   Post-op Pain Management: Regional block   Induction: Intravenous  PONV Risk Score and Plan: 2 and Ondansetron, Dexamethasone, Midazolam and Treatment may vary due to age or medical condition  Airway Management Planned: Oral ETT  Additional Equipment:   Intra-op Plan:   Post-operative Plan: Extubation in OR  Informed Consent: I have reviewed the patients History and Physical, chart, labs and discussed the procedure including the risks, benefits and alternatives for the proposed anesthesia with the patient or authorized representative who has indicated his/her understanding and acceptance.     Dental advisory given  Plan Discussed with: CRNA and Surgeon  Anesthesia Plan Comments: (Plan for preoperative adductor saphenous nerve block and GETA.  Patient consented for risks of anesthesia including but not limited to:  - adverse reactions to medications - damage to eyes, teeth, lips or other oral mucosa - nerve damage due to positioning  - sore throat or hoarseness - damage to heart, brain, nerves, lungs, other parts of body or loss of life  Informed patient about role of CRNA in peri- and intra-operative care.  Patient voiced understanding.)      Anesthesia Quick Evaluation

## 2021-05-23 NOTE — Anesthesia Procedure Notes (Signed)
Procedure Name: Intubation Date/Time: 05/23/2021 9:34 AM Performed by: Malva Cogan, CRNA Pre-anesthesia Checklist: Patient identified, Patient being monitored, Timeout performed, Emergency Drugs available and Suction available Patient Re-evaluated:Patient Re-evaluated prior to induction Oxygen Delivery Method: Circle system utilized Preoxygenation: Pre-oxygenation with 100% oxygen Induction Type: IV induction Ventilation: Mask ventilation without difficulty Laryngoscope Size: 3 and McGraph Grade View: Grade I Tube type: Oral Tube size: 7.0 mm Number of attempts: 1 Airway Equipment and Method: Stylet and Video-laryngoscopy Placement Confirmation: ETT inserted through vocal cords under direct vision, positive ETCO2 and breath sounds checked- equal and bilateral Secured at: 21 cm Tube secured with: Tape Dental Injury: Teeth and Oropharynx as per pre-operative assessment

## 2021-05-23 NOTE — H&P (Signed)
Paper H&P to be scanned into permanent record. H&P reviewed. No significant changes noted.  

## 2021-05-23 NOTE — Anesthesia Postprocedure Evaluation (Signed)
Anesthesia Post Note  Patient: CHRYSTIAN CUPPLES  Procedure(s) Performed: Left arthroscopic ACL reconstruction using quadriceps tendon autograft, lateral meniscus repair, chondroplasty of the medial femoral condyle (Left: Knee)  Patient location during evaluation: PACU Anesthesia Type: Regional and General Level of consciousness: awake and alert, oriented and patient cooperative Pain management: pain level controlled Vital Signs Assessment: post-procedure vital signs reviewed and stable Respiratory status: spontaneous breathing, nonlabored ventilation and respiratory function stable Cardiovascular status: blood pressure returned to baseline and stable Postop Assessment: adequate PO intake Anesthetic complications: no   No notable events documented.   Last Vitals:  Vitals:   05/23/21 1400 05/23/21 1418  BP:  134/74  Pulse:  88  Resp:  18  Temp: 37 C 36.9 C  SpO2:  99%    Last Pain:  Vitals:   05/23/21 1418  TempSrc: Temporal  PainSc: 4                  Reed Breech

## 2021-05-23 NOTE — Discharge Instructions (Addendum)
Arthroscopic ACL Surgery with Meniscus Repair ?  ?Post-Op Instructions ?  ?1. Bracing or crutches: Crutches will be provided at the time of discharge from the surgery center.  ?  ?2. Ice: You may be provided with a device (Polar Care) that allows you to ice the affected area effectively. Otherwise you can ice manually.  ? ?3. Driving:  Driving: Off all narcotic pain meds when operating vehicle  ? 1 week for automatic cars, left leg surgery ? 4 weeks for standard/manual cars or right leg surgery ?  ?4. Activity: Ankle pumps several times an hour while awake to prevent blood clots. Weight bearing: foot-flat weight bearing is permitted with brace locked in extension (weight of leg for balance only -- must use arms for support when operative leg is on the ground). The brace should not be unlocked in order to protect the meniscus repair. Unlock only for hygiene and for exercises as directed by physical therapist. Elevate knee above heart level as much as possible for one week. Avoid standing more than 5 minutes (consecutively) for the first week. No exercise involving the knee until cleared by the surgeon or physical therapist. Ideally, you should avoid long distance travel for 4 weeks. ?  ?5. Medications:  ?- You have been provided a prescription for narcotic pain medicine (oxycodone). After surgery, take 1-2 narcotic tablets every 4 hours if needed for severe pain.  ?- A prescription for anti-nausea medication (Zofran) will be provided in case the narcotic medicine causes nausea - take 1 tablet every 6 hours only if nauseated.  ?- Take ibuprofen 800 mg every 8 hours with food (scheduled) to reduce post-operative knee swelling. DO NOT STOP IBUPROFEN POST-OP UNTIL INSTRUCTED TO DO SO at first post-op office visit (10-14 days after surgery).  ?- Take enteric coated aspirin 325 mg once daily for 4 weeks to prevent blood clots.  ?-Take tylenol 1000 mg every 8 hours for pain (scheduled).  May stop tylenol ~1-2 weeks after  surgery if you are having minimal pain. ?- Take gabapentin 300 mg three times daily for 5 days (scheduled) ?- Take Valium 5mg three times daily as needed x 2 weeks. May stop if not having any significant muscle spasms. ? ?If you are taking prescription medication for anxiety, depression, insomnia, muscle spasm, chronic pain, or for attention deficit disorder you are advised that you are at a higher risk of adverse effects with use of narcotics post-op, including narcotic addiction/dependence, depressed breathing, death. ?If you use non-prescribed substances: alcohol, marijuana, cocaine, heroin, methamphetamines, etc., you are at a higher risk of adverse effects with use of narcotics post-op, including narcotic addiction/dependence, depressed breathing, death. ?You are advised that taking > 50 morphine milligram equivalents (MME) of narcotic pain medication per day results in twice the risk of overdose or death. For your prescription provided: oxycodone 5 mg - taking more than 6 tablets per day. Be advised that we will prescribe narcotics short-term, for acute post-operative pain only - 1 week for minor operations such as knee arthroscopy for meniscus tear resection, and 3 weeks for major operations such as knee repair/reconstruction surgeries.  ? ?6. Bandages: The physical therapist should change the bandages at the first post-op appointment. If needed, the dressing supplies have been provided to you. ?  ?7. Physical Therapy: 2 times per week for the first 4 weeks, then 1-2 times per week from weeks 4-8 post-op. Therapy typically starts within 1 week. You have been provided an order for physical therapy. The therapist   will provide home exercises. Attending physical therapy and performing the appropriate exercises on your own at home daily will determine how well you do after this surgery. If you do not know when your first physical therapy appointment is, please call the office at [phone]. ?8. Work/School: May  return when able to tolerate standing for greater than 2 hours and off of narcotic pain medications. Can return to school usually in ~1-2 weeks.  ?  ?9. Post-Op Appointments: ?Your first post-op appointment will be with Dr. Patel in approximately 2 weeks time.  ?  ?If you find that they have not been scheduled please call the Orthopaedic Appointment front desk at [phone]. ? ? ? ? ?AMBULATORY SURGERY  ?DISCHARGE INSTRUCTIONS ? ? ?The drugs that you were given will stay in your system until tomorrow so for the next 24 hours you should not: ? ?Drive an automobile ?Make any legal decisions ?Drink any alcoholic beverage ? ? ?You may resume regular meals tomorrow.  Today it is better to start with liquids and gradually work up to solid foods. ? ?You may eat anything you prefer, but it is better to start with liquids, then soup and crackers, and gradually work up to solid foods. ? ? ?Please notify your doctor immediately if you have any unusual bleeding, trouble breathing, redness and pain at the surgery site, drainage, fever, or pain not relieved by medication. ? ? ? ?Additional Instructions: ? ?Please contact your physician with any problems or Same Day Surgery at [phone], Monday through Friday 6 am to 4 pm, or [facility] Main number at [phone].  ?

## 2021-05-23 NOTE — Transfer of Care (Signed)
Immediate Anesthesia Transfer of Care Note  Patient: Darrell Kennedy  Procedure(s) Performed: Left arthroscopic ACL reconstruction using quadriceps tendon autograft, lateral meniscus repair, chondroplasty of the medial femoral condyle (Left: Knee)  Patient Location: PACU  Anesthesia Type:General  Level of Consciousness: awake, alert  and oriented  Airway & Oxygen Therapy: Patient Spontanous Breathing and Patient connected to nasal cannula oxygen  Post-op Assessment: Report given to RN and Post -op Vital signs reviewed and stable  Post vital signs: Reviewed and stable  Last Vitals:  Vitals Value Taken Time  BP 132/79 05/23/21 1236  Temp    Pulse 96 05/23/21 1239  Resp 8 05/23/21 1239  SpO2 100 % 05/23/21 1239  Vitals shown include unvalidated device data.  Last Pain:  Vitals:   05/23/21 0834  TempSrc: Temporal  PainSc: 0-No pain         Complications: No notable events documented.

## 2021-05-24 ENCOUNTER — Encounter: Payer: Self-pay | Admitting: Orthopedic Surgery

## 2021-05-24 ENCOUNTER — Telehealth: Payer: Self-pay

## 2021-05-24 NOTE — Telephone Encounter (Signed)
PA for Biktarvy approved for the patient 05/23/21-05/22/22. Patient informed via mychart.  EOC [phone] [place] Jonathon Resides

## 2021-05-30 ENCOUNTER — Other Ambulatory Visit: Payer: Self-pay

## 2021-05-30 ENCOUNTER — Telehealth (INDEPENDENT_AMBULATORY_CARE_PROVIDER_SITE_OTHER): Payer: Self-pay | Admitting: Psychiatry

## 2021-05-30 DIAGNOSIS — Z91199 Patient's noncompliance with other medical treatment and regimen due to unspecified reason: Secondary | ICD-10-CM

## 2021-05-30 NOTE — Progress Notes (Signed)
No response to call or text or video invite  

## 2021-05-31 ENCOUNTER — Other Ambulatory Visit: Payer: Self-pay | Admitting: Infectious Diseases

## 2021-05-31 ENCOUNTER — Telehealth: Payer: Self-pay

## 2021-05-31 DIAGNOSIS — B20 Human immunodeficiency virus [HIV] disease: Secondary | ICD-10-CM

## 2021-05-31 MED ORDER — BIKTARVY 50-200-25 MG PO TABS: 1.0000 | ORAL_TABLET | Freq: Every day | ORAL | 6 refills | Status: DC

## 2021-06-01 NOTE — Telephone Encounter (Signed)
Refill request completed.

## 2021-06-03 ENCOUNTER — Telehealth: Payer: Self-pay | Admitting: Registered Nurse

## 2021-06-03 ENCOUNTER — Encounter: Payer: Self-pay | Admitting: Registered Nurse

## 2021-06-03 DIAGNOSIS — Z9889 Other specified postprocedural states: Secondary | ICD-10-CM

## 2021-06-03 NOTE — Telephone Encounter (Unsigned)
HR Tim asking for copy of patient return to work note from [employer]  Per Epic Care Everywhere note was faxed to [phone]  Epic states "Must use crutches and avoid full weight bearing on operative leg. Must wear brace locked in extension at all times. Otherwise OK if off of narcotic pain medications Electronically signed by Novella Olive, MD at 06/03/2021 3:16 PM EST" for RTW 06/06/21    S/P - Left arthroscopic ACL reconstruction - 05/23/21 - Allena Katz  HR Tim notified will follow up with patient 2/19 via telephone.

## 2021-06-27 ENCOUNTER — Other Ambulatory Visit: Payer: Self-pay | Admitting: Psychiatry

## 2021-06-27 DIAGNOSIS — F3181 Bipolar II disorder: Secondary | ICD-10-CM

## 2021-06-28 NOTE — Telephone Encounter (Signed)
Patient continuing PT next orthopedics appt 07/06/21 per Epic review.  Patient not in office today when attempted to speak with him. ?

## 2021-06-29 ENCOUNTER — Other Ambulatory Visit: Payer: Self-pay

## 2021-06-29 ENCOUNTER — Ambulatory Visit (INDEPENDENT_AMBULATORY_CARE_PROVIDER_SITE_OTHER): Payer: No Typology Code available for payment source | Admitting: Psychiatry

## 2021-06-29 ENCOUNTER — Encounter: Payer: Self-pay | Admitting: Psychiatry

## 2021-06-29 VITALS — BP 129/79 | HR 81 | Temp 98.7°F

## 2021-06-29 DIAGNOSIS — R41840 Attention and concentration deficit: Secondary | ICD-10-CM

## 2021-06-29 DIAGNOSIS — F3178 Bipolar disorder, in full remission, most recent episode mixed: Secondary | ICD-10-CM

## 2021-06-29 DIAGNOSIS — F411 Generalized anxiety disorder: Secondary | ICD-10-CM

## 2021-06-29 DIAGNOSIS — F5105 Insomnia due to other mental disorder: Secondary | ICD-10-CM

## 2021-06-29 MED ORDER — HYDROXYZINE HCL 25 MG PO TABS: 12.5000 mg | ORAL_TABLET | Freq: Two times a day (BID) | ORAL | 1 refills | Status: DC | PRN

## 2021-06-29 NOTE — Patient Instructions (Signed)
Hydroxyzine Capsules or Tablets °What is this medication? °HYDROXYZINE (hye DROX i zeen) treats the symptoms of allergies and allergic reactions. It may also be used to treat anxiety or cause drowsiness before a procedure. It works by blocking histamine, a substance released by the body during an allergic reaction. It belongs to a group of medications called antihistamines. °This medicine may be used for other purposes; ask your health care provider or pharmacist if you have questions. °COMMON BRAND NAME(S): ANX, Atarax, Rezine, Vistaril °What should I tell my care team before I take this medication? °They need to know if you have any of these conditions: °Glaucoma °Heart disease °History of irregular heartbeat °Kidney disease °Liver disease °Lung or breathing disease, like asthma °Stomach or intestine problems °Thyroid disease °Trouble passing urine °An unusual or allergic reaction to hydroxyzine, cetirizine, other medications, foods, dyes or preservatives °Pregnant or trying to get pregnant °Breast-feeding °How should I use this medication? °Take this medication by mouth with a full glass of water. Follow the directions on the prescription label. You may take this medication with food or on an empty stomach. Take your medication at regular intervals. Do not take your medication more often than directed. °Talk to your care team regarding the use of this medication in children. Special care may be needed. While this medication may be prescribed for children as young as 6 years of age for selected conditions, precautions do apply. °Patients over 65 years old may have a stronger reaction and need a smaller dose. °Overdosage: If you think you have taken too much of this medicine contact a poison control center or emergency room at once. °NOTE: This medicine is only for you. Do not share this medicine with others. °What if I miss a dose? °If you miss a dose, take it as soon as you can. If it is almost time for your next  dose, take only that dose. Do not take double or extra doses. °What may interact with this medication? °Do not take this medication with any of the following: °Cisapride °Dronedarone °Pimozide °Thioridazine °This medication may also interact with the following: °Alcohol °Antihistamines for allergy, cough, and cold °Atropine °Barbiturate medications for sleep or seizures, like phenobarbital °Certain antibiotics like erythromycin or clarithromycin °Certain medications for anxiety or sleep °Certain medications for bladder problems like oxybutynin, tolterodine °Certain medications for depression or psychotic disturbances °Certain medications for irregular heart beat °Certain medications for Parkinson's disease like benztropine, trihexyphenidyl °Certain medications for seizures like phenobarbital, primidone °Certain medications for stomach problems like dicyclomine, hyoscyamine °Certain medications for travel sickness like scopolamine °Ipratropium °Narcotic medications for pain °Other medications that prolong the QT interval (which can cause an abnormal heart rhythm) like dofetilide °This list may not describe all possible interactions. Give your health care provider a list of all the medicines, herbs, non-prescription drugs, or dietary supplements you use. Also tell them if you smoke, drink alcohol, or use illegal drugs. Some items may interact with your medicine. °What should I watch for while using this medication? °Tell your care team if your symptoms do not improve. °You may get drowsy or dizzy. Do not drive, use machinery, or do anything that needs mental alertness until you know how this medication affects you. Do not stand or sit up quickly, especially if you are an older patient. This reduces the risk of dizzy or fainting spells. Alcohol may interfere with the effect of this medication. Avoid alcoholic drinks. °Your mouth may get dry. Chewing sugarless gum or sucking hard   candy, and drinking plenty of water may  help. Contact your care team if the problem does not go away or is severe. °This medication may cause dry eyes and blurred vision. If you wear contact lenses you may feel some discomfort. Lubricating drops may help. See your eye care specialist if the problem does not go away or is severe. °If you are receiving skin tests for allergies, tell your care team you are using this medication. °What side effects may I notice from receiving this medication? °Side effects that you should report to your care team as soon as possible: °Allergic reactions--skin rash, itching, hives, swelling of the face, lips, tongue, or throat °Heart rhythm changes--fast or irregular heartbeat, dizziness, feeling faint or lightheaded, chest pain, trouble breathing °Side effects that usually do not require medical attention (report to your care team if they continue or are bothersome): °Confusion °Drowsiness °Dry mouth °Hallucinations °Headache °This list may not describe all possible side effects. Call your doctor for medical advice about side effects. You may report side effects to FDA at 1-800-FDA-1088. °Where should I keep my medication? °Keep out of the reach of children and pets. °Store at room temperature between 15 and 30 degrees C (59 and 86 degrees F). Keep container tightly closed. Throw away any unused medication after the expiration date. °NOTE: This sheet is a summary. It may not cover all possible information. If you have questions about this medicine, talk to your doctor, pharmacist, or health care provider. °© 2022 [employer] (2020-06-16 00:00:00) ° °

## 2021-06-29 NOTE — Progress Notes (Signed)
[facility] MD OP Progress Note ? ?06/29/2021 12:44 PM ?Darrell Kennedy  ?MRN:  [id number] ? ?Chief Complaint:  ?Chief Complaint  ?Patient presents with  ? Follow-up: 41 year old Caucasian male, with history of bipolar 2 disorder, insomnia, attention and concentration deficit, presented for medication management.  ? ?HPI: Darrell Kennedy is a 41 year old Caucasian male, lives in [place], has a history of bipolar disorder, insomnia, hyperlipidemia, HIV positive was evaluated in office today. ? ?Patient today denies any manic or depressive symptoms.  He is compliant on the Lamictal. ? ?He however struggles with anxiety symptoms.  He reports he constantly feels overwhelmed, on edge, worrying a lot.  This has been getting worse since he started his new long-distance relationship with his partner.  He reports he stopped taking the Lexapro due to sexual side effects and that seems to make his anxiety worse.  He is interested in adding a medication however would like to try something as needed rather than scheduled for now.  He has established care with a psychotherapist and has his appointment coming up.  Looks forward to that. ? ?Patient denies any suicidality, homicidality or perceptual disturbances. ? ?Patient reports sleep is good.  He stopped taking the Ambien when he was prescribed oxycodone after surgery.  He reports he has been able to manage his sleep without the Ambien.  He however would like to have the Ambien as needed just in case. ? ?Continues to struggle with his concentration especially at work.  He does have upcoming appointment for neuropsychological testing coming up tomorrow. ? ?Patient is status post left arthroscopic ACL reconstruction, 05/23/2021, reviewed notes per Dr. Posey Pronto.  Patient currently using crutches to walk.  Denies any pain.  Reports he was able to take 2 weeks off from work however currently is back at work.  He is also able to drive and that helps.  Reports his partner came from [place] to  support him through his surgery. ? ?Patient reports work is going well. ? ?Patient denies any other concerns today. ? ? ? ? ? ?Visit Diagnosis:  ?  ICD-10-CM   ?1. Bipolar disorder, in full remission, most recent episode mixed ([place])  F31.78   ? type 2, hypomanic  ?  ?2. Generalized anxiety disorder  F41.1 hydrOXYzine (ATARAX) 25 MG tablet  ?  ?3. Insomnia due to mental condition  F51.05   ? mood  ?  ?4. Attention and concentration deficit  R41.840   ?  ? ? ?Past Psychiatric History: Reviewed past psychiatric history from progress note on 02/06/2019.  Past trials of BuSpar, Seroquel. ? ?Past Medical History:  ?Past Medical History:  ?Diagnosis Date  ? Anxiety   ? Depression   ? Heart murmur   ? HIV (human immunodeficiency virus infection) ([place])   ? HLD (hyperlipidemia)   ? Stroke [facility])   ?  ?Past Surgical History:  ?Procedure Laterality Date  ? KNEE ARTHROSCOPY WITH ANTERIOR CRUCIATE LIGAMENT (ACL) REPAIR WITH HAMSTRING GRAFT Left 05/23/2021  ? Procedure: Left arthroscopic ACL reconstruction using quadriceps tendon autograft, lateral meniscus repair, chondroplasty of the medial femoral condyle;  Surgeon: Leim Fabry, MD;  Location: [facility] ORS;  Service: Orthopedics;  Laterality: Left;  ? MOUTH SURGERY  2021  ? [facility]  ? TEE WITHOUT CARDIOVERSION N/A 03/05/2019  ? Procedure: TRANSESOPHAGEAL ECHOCARDIOGRAM (TEE);  Surgeon: Corey Skains, MD;  Location: [facility] ORS;  Service: Cardiovascular;  Laterality: N/A;  ? ? ?Family Psychiatric History: Reviewed family psychiatric history from progress note on 02/06/2019. ? ?  Family History:  ?Family History  ?Problem Relation Age of Onset  ? Hypertension Father   ? Diabetes Father   ? Kidney disease Father   ? Depression Father   ? Hyperlipidemia Father   ? Alcohol abuse Brother   ? Heart disease Paternal Grandmother   ? Drug abuse Cousin   ? ? ?Social History: Reviewed social history from progress note on 02/06/2019. ?Social History  ? ?Socioeconomic History  ? Marital  status: Single  ?  Spouse name: Not on file  ? Number of children: 0  ? Years of education: Not on file  ? Highest education level: Master's degree (e.g., MA, MS, MEng, MEd, MSW, MBA)  ?Occupational History  ? Occupation: student  ?Tobacco Use  ? Smoking status: Never  ? Smokeless tobacco: Never  ? Tobacco comments:  ?  None  ?Vaping Use  ? Vaping Use: Never used  ?Substance and Sexual Activity  ? Alcohol use: Yes  ?  Alcohol/week: 0.0 standard drinks  ?  Comment: social maybe once a week  ? Drug use: Never  ? Sexual activity: Yes  ?  Birth control/protection: Condom  ?Other Topics Concern  ? Not on file  ?Social History Narrative  ? Not on file  ? ?Social Determinants of Health  ? ?Financial Resource Strain: Not on file  ?Food Insecurity: Not on file  ?Transportation Needs: Not on file  ?Physical Activity: Not on file  ?Stress: Not on file  ?Social Connections: Not on file  ? ? ?Allergies:  ?Allergies  ?Allergen Reactions  ? Amoxicillin Rash  ? ? ?Metabolic Disorder Labs: ?Lab Results  ?Component Value Date  ? HGBA1C 4.2 (L) 11/05/2020  ? MPG 114.02 02/12/2019  ? ?No results found for: PROLACTIN ?Lab Results  ?Component Value Date  ? CHOL 232 (H) 11/05/2020  ? TRIG 84 11/05/2020  ? HDL 49 11/05/2020  ? CHOLHDL 4.7 11/05/2020  ? VLDL 14 09/23/2019  ? LDLCALC 168 (H) 11/05/2020  ? [place] 89 09/23/2019  ? ?Lab Results  ?Component Value Date  ? TSH 0.965 11/05/2020  ? TSH 1.78 02/12/2019  ? ? ?Therapeutic Level Labs: ?No results found for: LITHIUM ?No results found for: VALPROATE ?No components found for:  CBMZ ? ?Current Medications: ?Current Outpatient Medications  ?Medication Sig Dispense Refill  ? acetaminophen (TYLENOL) 500 MG tablet Take 2 tablets (1,000 mg total) by mouth every 8 (eight) hours. 90 tablet 2  ? atorvastatin (LIPITOR) 20 MG tablet Take 20 mg by mouth daily.    ? bictegravir-emtricitabine-tenofovir AF (BIKTARVY) 50-200-25 MG TABS tablet Take 1 tablet by mouth daily. 30 tablet 6  ? Coenzyme Q10  (COQ10) 100 MG CAPS Take 100 mg by mouth daily.    ? COLLAGEN PO Take 3 capsules by mouth daily.    ? hydrOXYzine (ATARAX) 25 MG tablet Take 0.5-1 tablets (12.5-25 mg total) by mouth 2 (two) times daily as needed. For severe anxiety only 60 tablet 1  ? ketoconazole (NIZORAL) 2 % shampoo Apply 1 application topically 2 (two) times a week.    ? lamoTRIgine (LAMICTAL) 150 MG tablet TAKE 1 TABLET(150 MG) BY MOUTH TWICE DAILY 180 tablet 0  ? Misc Natural Products (GLUCOSAMINE CHOND MSM FORMULA PO) Take 2 capsules by mouth daily.    ? Multiple Vitamin (MULTIVITAMIN WITH MINERALS) TABS tablet Take 1 tablet by mouth daily.    ? Turmeric Curcumin 500 MG CAPS Take 500 mg by mouth daily.    ? diazepam (VALIUM) 5 MG tablet Take  1 tablet (5 mg total) by mouth every 8 (eight) hours as needed for muscle spasms. (Patient not taking: Reported on 06/29/2021) 30 tablet 0  ? gabapentin (NEURONTIN) 300 MG capsule Take 1 capsule (300 mg total) by mouth 3 (three) times daily for 5 days. (Patient not taking: Reported on 06/29/2021) 15 capsule 0  ? oxyCODONE (ROXICODONE) 5 MG immediate release tablet Take 1-2 tablets (5-10 mg total) by mouth every 4 (four) hours as needed (pain). (Patient not taking: Reported on 06/29/2021) 30 tablet 0  ? zolpidem (AMBIEN CR) 12.5 MG CR tablet TAKE 1 TABLET(12.5 MG) BY MOUTH AT BEDTIME AS NEEDED FOR SLEEP (Patient not taking: Reported on 06/29/2021) 90 tablet 0  ? ?No current facility-administered medications for this visit.  ? ? ? ?Musculoskeletal: ?Strength & Muscle Tone: within normal limits ?Gait & Station: walks with crutches, S/P left knee surgery ?Patient leans: N/A ? ?Psychiatric Specialty Exam: ?Review of Systems  ?Musculoskeletal:   ?     S/P left knee surgery   ?Psychiatric/Behavioral:  Positive for decreased concentration.   ?All other systems reviewed and are negative.  ?Blood pressure 129/79, pulse 81, temperature 98.7 ?F (37.1 ?C), temperature source Temporal.There is no height or weight on file  to calculate BMI.  ?General Appearance: Casual  ?Eye Contact:  Fair  ?Speech:  Clear and Coherent  ?Volume:  Normal  ?Mood:  Euthymic  ?Affect:  Congruent  ?Thought Process:  Goal Directed and Descriptions of Asso

## 2021-06-30 ENCOUNTER — Encounter: Payer: No Typology Code available for payment source | Attending: Psychology | Admitting: Psychology

## 2021-06-30 DIAGNOSIS — F3176 Bipolar disorder, in full remission, most recent episode depressed: Secondary | ICD-10-CM | POA: Insufficient documentation

## 2021-06-30 DIAGNOSIS — R41840 Attention and concentration deficit: Secondary | ICD-10-CM | POA: Insufficient documentation

## 2021-06-30 DIAGNOSIS — F411 Generalized anxiety disorder: Secondary | ICD-10-CM | POA: Insufficient documentation

## 2021-07-05 ENCOUNTER — Other Ambulatory Visit: Payer: Self-pay

## 2021-07-05 DIAGNOSIS — F3176 Bipolar disorder, in full remission, most recent episode depressed: Secondary | ICD-10-CM

## 2021-07-05 DIAGNOSIS — R41840 Attention and concentration deficit: Secondary | ICD-10-CM | POA: Diagnosis not present

## 2021-07-05 DIAGNOSIS — F411 Generalized anxiety disorder: Secondary | ICD-10-CM

## 2021-07-05 NOTE — Progress Notes (Signed)
? ?  Behavioral Observations ?The patient was well-groomed and appropriately dressed. His manners were polite and appropriate to the situation. He demonstrated a positive attitude toward testing and showed good effort.  ? ?Neuropsychology Note ? ?Darrell Kennedy completed 90 minutes of neuropsychological testing with technician, Darrell Kennedy, Darrell Kennedy, under the supervision of Darrell Phenix, PsyD., Clinical Neuropsychologist. The patient did not appear overtly distressed by the testing session, per behavioral observation or via self-report to the technician. Rest breaks were offered.  ? ?Clinical Decision Making: In considering the patient's current level of functioning, level of presumed impairment, nature of symptoms, emotional and behavioral responses during clinical interview, level of literacy, and observed level of motivation/effort, a battery of tests was selected by Dr. Kieth Kennedy during initial consultation on 06/30/2021. This was communicated to the technician. Communication between the neuropsychologist and technician was ongoing throughout the testing session and changes were made as deemed necessary based on patient performance on testing, technician observations and additional pertinent factors such as those listed above. ? ?Tests Administered: ?Comprehensive Attention Battery (CAB) ?Continuous Performance Test (CPT) ? ?Results: ?Will be included in final report  ? ?Feedback to Patient: ?Darrell Kennedy will return on 07/26/2021 for an interactive feedback session with Dr. Kieth Kennedy at which time his test performances, clinical impressions and treatment recommendations will be reviewed in detail. The patient understands he can contact our office should he require our assistance before this time. ? ?90 minutes spent face-to-face with patient administering standardized tests, 30 minutes spent scoring (technician). [CPT [id number], [id number]] ? ?Full report to follow.  ?

## 2021-07-10 NOTE — Telephone Encounter (Signed)
Patient had ortho appt and instructed: ? ?1. Continue WBAT with 2 crutches and and continue to wean from crutches and brace as tolerated over the next 1-2 weeks. Focus on maintaining normal gait ? ?2. Continue PT per ACL reconstruction plus meniscus repair rehab protocol. ? ?3. Ibuprofen/as needed ? ?4. Follow-up in 6 weeks. Plan to return to jogging program at that time. ?Electronically signed by Langston Reusing, MD at 07/06/2021 11:16 AM EDT ? ?Has PT scheduled for this week.  Noted in chart brace was unlocked now. ?

## 2021-07-12 ENCOUNTER — Encounter (HOSPITAL_BASED_OUTPATIENT_CLINIC_OR_DEPARTMENT_OTHER): Payer: No Typology Code available for payment source | Admitting: Psychology

## 2021-07-12 ENCOUNTER — Other Ambulatory Visit: Payer: Self-pay

## 2021-07-12 ENCOUNTER — Encounter: Payer: Self-pay | Admitting: Psychology

## 2021-07-12 DIAGNOSIS — F411 Generalized anxiety disorder: Secondary | ICD-10-CM | POA: Diagnosis not present

## 2021-07-12 DIAGNOSIS — F3176 Bipolar disorder, in full remission, most recent episode depressed: Secondary | ICD-10-CM

## 2021-07-14 ENCOUNTER — Encounter: Payer: Self-pay | Admitting: Psychology

## 2021-07-14 NOTE — Progress Notes (Signed)
Neuropsychological Evaluation ? ? ?Patient:  Darrell Kennedy  ? ?DOB: [birth year] ? ?MR Number: [id number] ? ?Location: [facility] ?[place], [address] [place] ?[id number] [facility] ?[place] [address] ?Dept: [phone] ? ?Start: 4 PM ?End: 5 PM ? ?Provider/Observer:     Edgardo Roys PsyD ? ?Chief Complaint:      ?Chief Complaint  ?Patient presents with  ? Other  ?  Reports of attention and concentration and task completion difficulties  ? Anxiety  ? Depression  ? ? ?Reason For Service:     Darrell Kennedy is a 41 year old male who was referred for neuropsychological evaluation by his treating psychiatrist Ursula Alert, MD as part of his overall psychiatric care.  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  The patient has a past psychiatric history of diagnosis of bipolar disorder with anxiety that was diagnosed in 2020 with symptoms clearly presenting themselves by 2018 and possibly earlier.  The patient reports episodes of overwhelming depression as well as manic episodes that include high levels of anxiety.  The patient also has a past history of TIA also occurring in 2020 with no other TIA type episodes and a diagnosis and management of HIV positive status.  The patient has been assessed for anticardiolipin status and other potential explanations for his TIA.  The patient has had normal MRI and CT scans done after his TIA symptoms developed, which were normal. ? ?The patient reports that he did not really have any big issues with attention and concentration or impulse control throughout school and was always a very intelligent and smart individual and never had a great deal of difficulty successfully completing school.  The  patient reports that he did have attentional issues in kindergarten but he was rather young for his class but was described as very hyper and high-energy but quite smart.  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  The patient completed his masters in organizational psychology reports that now he is having significant difficulties maintaining his focus and attention and has a great deal of difficulty with time management and completing work.  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  The patient reports that he had been very focused in the past began having difficulty doing his masters class work and more difficulties over the past several years.  He describes it as having difficulty sitting down and completing task and that he gets distracted very easily.  He describes great struggle sitting down and completing task especially when he is at work. ? ?The patient has been followed by psychiatry and was diagnosed with bipolar and anxiety disorder with past mood disturbance including significant severe episodes of depression as well as manic episodes that include high anxiety.  Current medications include Lamictal for his mood disturbance and hydroxyzine for his anxiety and tension.  He is also taking gabapentin for his pain through Dr. Posey Pronto.  Patient also has a prescription of Valium and oxycodone prescribed by Dr. Posey Pronto  for pain. ? ?The patient is  HIV positive but is being well managed and keeping up with his medications. ? ?The patient has been followed by psychiatry and was diagnosed with bipolar affective disorder previously.  In 2018 he was living in [place] and reports that he did not put a lot of focus on taking care of himself during that time.  He reports that he  began having "peaks and valleys while living in [place] at the time and developed significant mood cycles.  The patient reports that looking back on his history, he probably began having these issues even in college.  He describes significant depressive episodes as well as manic episodes.  He reports that he feels like the structure in college help manage these but once he moved to [place] he lacked the structure and would spiral into a "dark place".  The patient reports that once he came back to [place] he began to focus more on his own wellbeing and engaging in self-care and his mood disorder has been much better managed and he is now completed his masters work. ? ?The patient has been assessed for obstructive sleep apnea but formal home-based sleep studies followed only mild symptoms of apneic events which did not arise to the level of needing any type of intervention.  The patient reports that he will try to go to sleep around 1130 and wake up around 6 30-7.  He reports some variability in appetite but describes it as "okay."  Patient describes his memory is "okay" but sometimes he forgets and has short-term memory type of difficulties at times. ? ?More specific details in history can be found in his EMR in my initial clinical interview report dated 06/30/2021 ? ?Tests Administered: ?Comprehensive Attention Battery (CAB) ?Continuous Performance Test (CPT) ? ?Participation Level:   Active ? ?Participation Quality:  Appropriate   ?   ?Behavioral Observation:  The patient was well-groomed and appropriately dressed. His manners were polite and appropriate to the situation. He demonstrated a positive attitude toward testing and showed good effort. ? ?Well Groomed, Alert, and Appropriate.  ? ?Summary of Results:   The patient was administered the comprehensive attention battery as well as the CAB CPT measures.  The patient appeared to fully participate and put forth good effort and this does appear to be a  valid assessment of his current attentional and executive functioning capacities. ? ?Initially, the patient completed the auditory/visual pure reaction time measures.  On the pure visual reaction time measure she correctly responded to 47 of 50 targets with 3 errors of omission in the accuracy scores were within normal limits relative to a normative comparison group.  The patient averaged 285 ms for his response times which is somewhat better than normative expectations.  On the pure auditory reaction time measure he correctly responded to 48 of 50 targets which is within normal limits and his average response time was 318 ms which is also slightly better than normative expectations. ? ?On the discriminate reaction time measures the patient correctly responded to 35 of 35 targets on the visual discriminate reaction time measure with only 1 error of commission and 0 errors of omission.  This performance is well within normative expectations.  Average response time was 314 ms which is nearly 1 standard deviation faster than normative comparison groups.  On the auditory discriminate reaction time measure he also correctly responded to 35 of 35 targets with only 1 error of commission.  This is an efficient accuracy performance.  Average response  time was 422 ms which is 1 standard deviation better than normative comparison groups as far as response time speeds.  However, the patient did have more difficulties on the mixed/shift discriminate reaction time measures where he correctly responded to 25 of 30 targets with 4 errors of commission and 5 errors of omission.  This is within normative expectations but approaching 1 standard deviation below predicted levels.  This does suggest 1 event of loss that where the patient had difficulty shifting between auditory versus visual targets.  His average response time was 521 ms which is within normative expectations. ? ?On the auditory/visual scan reaction time test the patient  did very well with regard to accuracy scores on the visual, auditory and mixed challenges.  The patient showed no errors of commission and had a total of 1 error of omission on all 3 of the test combined.  H

## 2021-07-14 NOTE — Progress Notes (Signed)
Neuropsychological Consultation ? ? ?Patient:   Darrell Kennedy  ? ?DOB:   1980-10-10 ? ?MR Number:  [id number] ? ?Location:  [facility] ?[facility] ?[address] [place], [address] [place] ?[id number] [facility] ?[place] [address] ?Dept: [phone] ?          ?Date of Service:   06/30/2021 ? ?Start Time:   9 AM ?End Time:   11 AM ? ?Today's visit was an in person visit that was conducted in my outpatient clinic office.  The patient and myself were present for this clinical interview.  The face-to-face clinical interview lasted for 1 hour and 15 minutes and the other 45 minutes was spent with record review, report writing and setting up testing protocols. ? ?Provider/Observer:  Arley Phenix, Psy.D.   ?    Clinical Neuropsychologist ?     ? ?Billing Code/Service: [id number] ? ?Chief Complaint:    Darrell Kennedy is a 41 year old male who is referred for neuropsychological evaluation by his treating psychiatrist Jomarie Longs, MD as part of his overall psychiatric care.  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  The patient has a past psychiatric history of diagnosis of bipolar disorder with anxiety that was diagnosed in 2020.  The patient reports episodes of overwhelming depression as well as manic episodes that include high levels of anxiety.  The patient also has a past history of TIA also occurring in 2020 and a diagnosis and management of HIV positive status.  The patient has been assessed for anticardiolipin status and other potential explanations for his TIA.  The patient has had normal MRI and CT scans done after his TIA symptoms developed which were normal. ? ?Reason for Service:  Darrell Kennedy is a 41 year old male who is referred for  neuropsychological evaluation by his treating psychiatrist Jomarie Longs, MD as part of his overall psychiatric care.  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  The patient has a past psychiatric history of diagnosis of bipolar disorder with anxiety that was diagnosed in 2020.  The patient reports episodes of overwhelming depression as well as manic episodes that include high levels of anxiety.  The patient also has a past history of TIA also occurring in 2020 with no other TIA type episodes and a diagnosis and management of HIV positive status.  The patient has been assessed for anticardiolipin status and other potential explanations for his TIA.  The patient has had normal MRI and CT scans done after his TIA symptoms developed which were normal. ? ?The patient reports that he did not really have any big issues with attention and concentration or impulse control throughout school and was always a very intelligent and smart individual and never had a great deal of difficulty successfully completing school.  The patient reports that he did have attentional issues in kindergarten but he was rather young for his class but was described as very hyper and high-energy but quite smart.  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  The patient  completed his masters in organizational psychology reports that now he is having significant difficulties maintaining his focus and attention day-to-day and has a great deal of difficulty with time management and completing work.  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  The patient reports that he had been very focused in the past began having difficulty doing his masters class work and more difficulties over the past several years.  He describes it as having difficulty sitting down and completing task and that he gets distracted very easily.  He describes great struggle sitting down and completing task especially when he is at work. ? ?The patient has been followed by psychiatry and was diagnosed with bipolar and anxiety disorder with past mood disturbance including significant severe episodes of depression as well as manic episodes that include high anxiety. ? ?The patient is HIV positive but is being well managed and keeping up with his medications. ? ?The patient has been followed by psychiatry and was diagnosed with bipolar affective disorder previously.  In 2018 he was living in [place] and reports that he did not put a lot of focus on taking care of himself during that time.  He reports that he began having "peaks and valleys while living in [place] at the time and developed significant mood cycles.  The patient reports that looking back on his history he probably began having these issues even in college.  He describes significant depressive episodes as well as manic episodes.  He reports that he feels like the structure in college help manage these but once he moved to [place] he lacked the structure and would spiral into a "dark place".  The patient reports that once he came back to [place] he began to focus more on his own wellbeing and engaging in self-care and his mood disorder has been much better managed and he is now completed his masters work. ? ?The patient has been assessed for obstructive sleep apnea but formal home-based sleep studies followed only mild symptoms of apneic events which did not arise to the level of needing any type of intervention.  The patient reports that he will try to go to sleep around 1130 and wake up around 6  30-7.  He reports some variability in appetite but describes it as "okay."  Patient describes his memory is "okay" but sometimes he forgets and has short-term memory type of difficulties at times. ? ?Behavioral Observation: Darrell Kennedy  presents as a 41 y.o.-year-old Right handed Caucasian Male who appeared his stated age. his dress was Appropriate and he was Well Groomed and his manners were Appropriate to the situation.  his participation was indicative of Appropriate behaviors.  There were not physical disabilities noted.  he displayed an appropriate level of cooperation and motivation.   ? ? ?Interactions:    Active Appropriate ? ?Attention:   within normal limits and attention span and concentration were age appropriate ? ?Memory:   within normal limits; recent and remote memory intact ? ?Visuo-spatial:  within normal limits ? ?Speech (Volume):  normal ? ?Speech:   normal; normal ? ?Thought Process:  Coherent and Relevant ? ?Though Content:  WNL; not suicidal and not homicidal ? ?Orientation:   person, place, time/date, and situation ? ?Judgment:   Good ? ?Planning:   Good ? ?Affect:    Appropriate ? ?Mood:  Euthymic ? ?Insight:   Good ? ?Intelligence:   high ? ?Marital Status/Living: The patient was born and raised in [place] along with 1 sibling.  He currently lives with his parents and has been living there for the past 4 years.  He had moved from [place] to [place] to work in [employer] for some time.  The patient is single and has no children. ? ?Current Employment: The patient currently works as a Production designer, theatre/television/film of a Chiropractor. ? 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  His hobbies and interest include music, food, travel and fitness. ? ?Substance Use:  No concerns of substance abuse are reported.  The patient describes rare use of alcohol and no other  substance use or abuse. ? ?Education:   The patient has completed his masters in Recruitment consultant after graduating from college.  He attended [employer] in [place] college as well as Surveyor, minerals. ? ?Medical Hi

## 2021-07-26 ENCOUNTER — Encounter: Payer: No Typology Code available for payment source | Attending: Psychology | Admitting: Psychology

## 2021-07-26 DIAGNOSIS — F3176 Bipolar disorder, in full remission, most recent episode depressed: Secondary | ICD-10-CM | POA: Diagnosis present

## 2021-07-26 DIAGNOSIS — F411 Generalized anxiety disorder: Secondary | ICD-10-CM | POA: Insufficient documentation

## 2021-08-10 ENCOUNTER — Telehealth: Payer: No Typology Code available for payment source | Admitting: Psychiatry

## 2021-08-10 NOTE — Telephone Encounter (Signed)
Orthopedics appt scheduled 08/17/21 ?

## 2021-08-11 ENCOUNTER — Encounter: Payer: Self-pay | Admitting: Psychology

## 2021-08-11 NOTE — Progress Notes (Signed)
07/26/2021 1 PM-2 PM: Today's visit was an in person visit that was conducted in my outpatient clinic office.  Today I provided feedback regarding the results of the recent neuropsychological evaluation with diagnostic considerations and recommendations.  I have included a copy of the impressions in summary from the full neuropsychological evaluation which can be found in the patient's EMR dated 07/12/2021. ? ? ? ?Impression/Diagnosis:                     Overall, the results of the current neuropsychological evaluation are not consistent with objective findings of specific attentional deficits particularly those related to areas of the prefrontal cortex.  There were no deficits for either auditory or visual attentional or executive functioning.  The patient showed excellent focus execute abilities and information processing times and excellent pure and discriminate reaction time for both auditory and visual components.  The patient did very well on measures of auditory and visual encoding and he also did very well relative to normative expectations on measures of sustaining attention and concentration.  Overall, other than 1 or 2 lapses of attention during the entire 2-hour battery of attentional and executive functioning measures the patient did exceptionally well across the board.  Sustaining attention, focused attention, freedom from distractibility, information processing speed, visual scanning and searching abilities, focus execute abilities and shifting attention were all well within normative expectations and there were only 1 episode of difficulty shifting attention clearly noted throughout the entire 2-hour assessment. ? ?As far as diagnostic considerations, I do think that the subjective reports of attentional difficulties are likely directly related to his underlying psychiatric condition related to bipolar affective disorder which is increasingly well managed through his effective psychiatric care.  The  patient does not have a history of significant attention and concentration deficits going through school and much of his issues developed around the same time of his developing issues related to bipolar disorder.  The patient is also taking gabapentin, oxycodone and Valium to better manage his pain conditions which can have a deleterious impact on attention and concentration/memory as well.  While the patient was assessed for sleep apnea and found to only have mild symptoms which did not need active intervention he does have at least significant episodes of sleep disturbance which is also likely playing a role in his subjective experiences of attention and concentration deficits.  Again, however, I do not think that the patient has adult residual attention deficit disorder and his underlying psychiatric condition, chronic pain issues, medication interventions for these pain issues as well as significant sleep issues are likely the primary culprit for his attention and concentration issues and difficulties in his workplace setting. ? ?I will sit down with the patient and go over the results of this objective neuropsychological assessment and talk to him about strategies going forward to improve day-to-day functioning.  He should continue his care with his psychiatrist as I do think that the evidence is fairly strong of an underlying mood disorder that has been well managed recently and addressing issues related to his pain, sleep disturbance and other issues may help with these other issues he is dealing with including attentional issues. ?  ?Diagnosis:                              ?  ?Bipolar disorder, in full remission, most recent episode depressed (HCC) ?  ?Generalized anxiety disorder ?  ?  ?  _____________________ ?Arley Phenix, Psy.D. ?Clinical Neuropsychologist ?

## 2021-08-25 ENCOUNTER — Other Ambulatory Visit: Payer: Self-pay | Admitting: Orthopedic Surgery

## 2021-08-25 DIAGNOSIS — S83512A Sprain of anterior cruciate ligament of left knee, initial encounter: Secondary | ICD-10-CM

## 2021-08-25 NOTE — Telephone Encounter (Signed)
Epic reviewed kept appt 5/10 with orthopedics; MRI pending today.  PT scheduled 11 and 19 May. ?

## 2021-08-29 ENCOUNTER — Ambulatory Visit
Admission: RE | Admit: 2021-08-29 | Discharge: 2021-08-29 | Disposition: A | Discharge status: Home / Self Care | Payer: No Typology Code available for payment source | Source: Ambulatory Visit | Attending: Orthopedic Surgery | Admitting: Orthopedic Surgery

## 2021-08-29 DIAGNOSIS — S83512A Sprain of anterior cruciate ligament of left knee, initial encounter: Secondary | ICD-10-CM | POA: Diagnosis not present

## 2021-08-29 IMAGING — MR MR KNEE*L* W/O CM
8 series · 40 of 40 positions shown · non-contrast
Comparison: [DATE]

CLINICAL DATA: Left knee pain.  History of prior ACL repair.

EXAM:
MRI OF THE LEFT KNEE WITHOUT CONTRAST
TECHNIQUE: Multiplanar, multisequence MR imaging of the knee was performed. No
intravenous contrast was administered.

[Series 8: T2 fat-sat · axial · left · 4.0mm · 0.50mm/px · z∈[-114,+11]mm · 5 of 26 slices shown (1 of 4)]
[im 1/26]
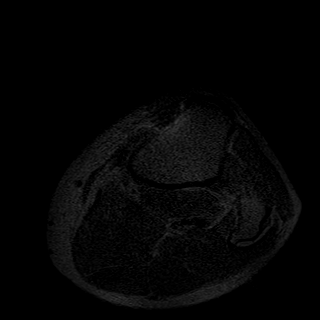
[im 7/26]
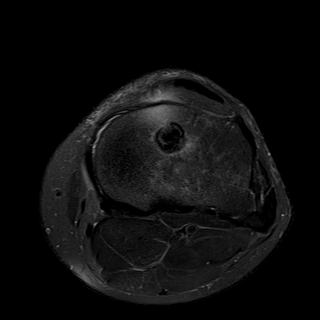
[im 13/26]
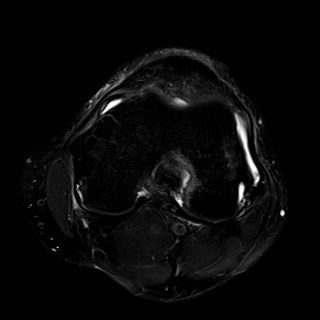
[im 19/26]
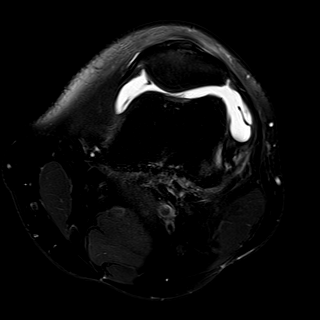
[im 26/26]
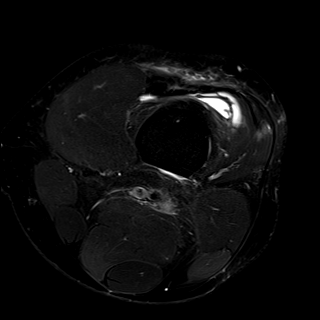

[Series 9: T2 fat-sat · coronal · left · 4.0mm · 0.59mm/px · 5 of 30 slices shown (2 of 4)]
[im 1/30]
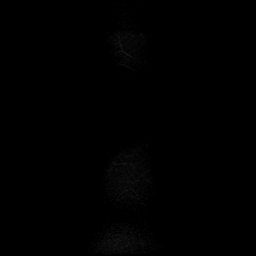
[im 8/30]
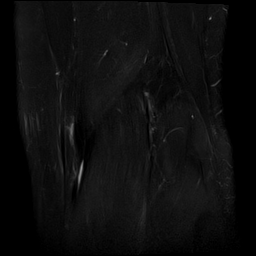
[im 15/30]
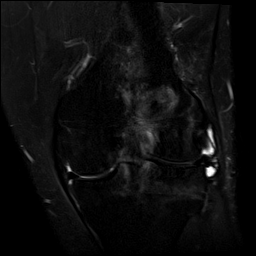
[im 22/30]
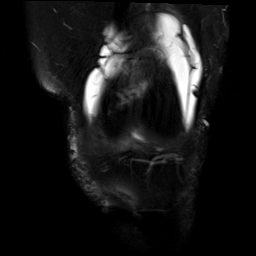
[im 30/30]
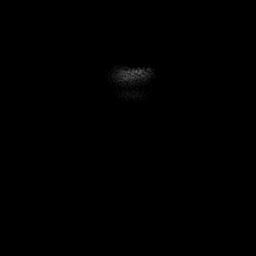

[Series 10: T1 · coronal · left · 4.0mm · 0.59mm/px · 5 of 30 slices shown]
[im 1/30]
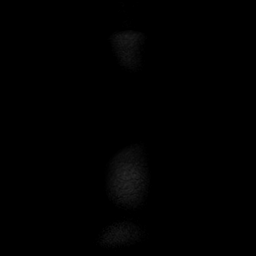
[im 8/30]
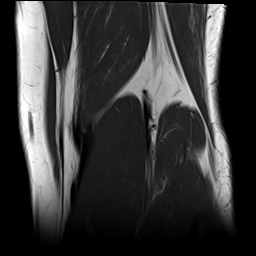
[im 15/30]
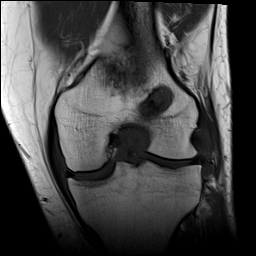
[im 22/30]
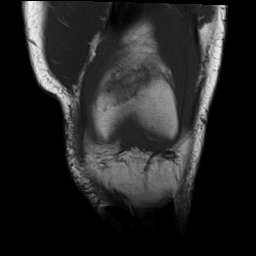
[im 30/30]
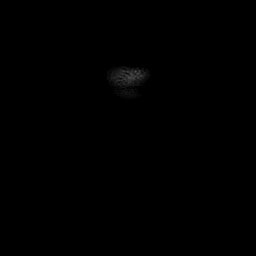

[Series 11: PD fat-sat · coronal · left · 4.0mm · 0.59mm/px · 5 of 30 slices shown (1 of 2)]
[im 1/30]
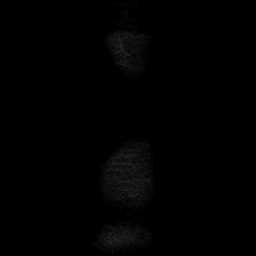
[im 8/30]
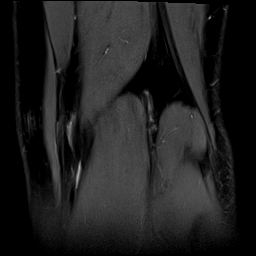
[im 15/30]
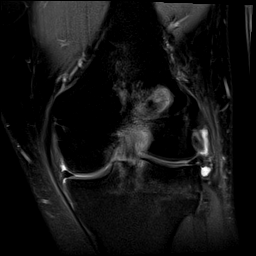
[im 22/30]
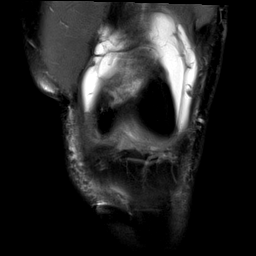
[im 30/30]
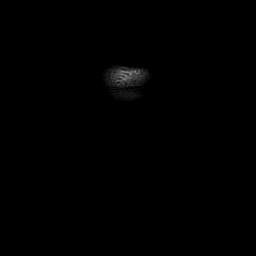

[Series 12: PD fat-sat · sagittal · left · 3.0mm · 0.59mm/px · 6 of 34 slices shown (2 of 2)]
[im 1/34]
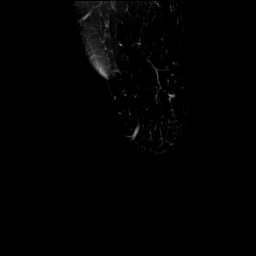
[im 7/34]
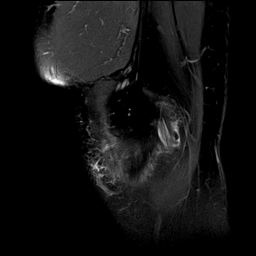
[im 14/34]
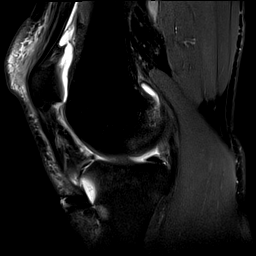
[im 20/34]
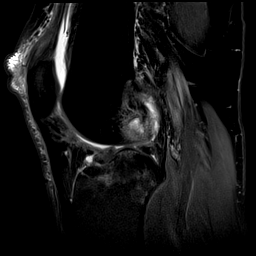
[im 27/34]
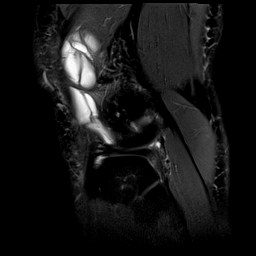
[im 34/34]
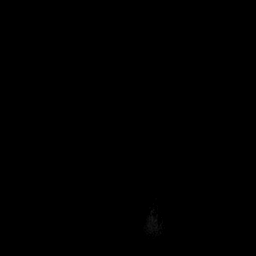

[Series 13: T2 fat-sat · sagittal · left · 3.0mm · 0.59mm/px · 6 of 34 slices shown (3 of 4)]
[im 1/34]
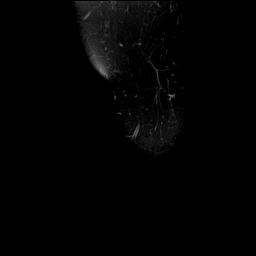
[im 7/34]
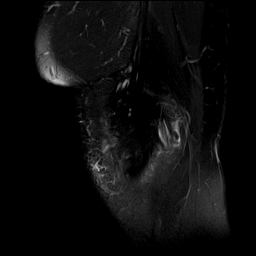
[im 14/34]
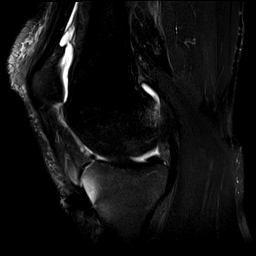
[im 20/34]
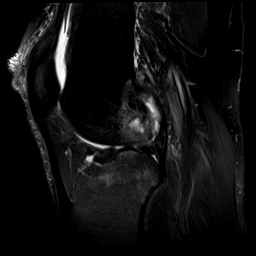
[im 27/34]
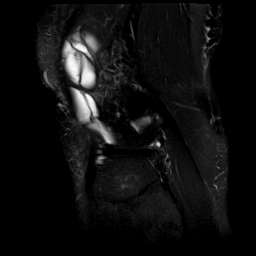
[im 34/34]
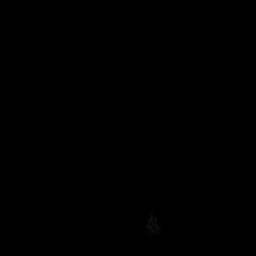

[Series 14: T2 fat-sat · coronal · left · 4.0mm · 0.59mm/px · 5 of 30 slices shown (4 of 4)]
[im 1/30]
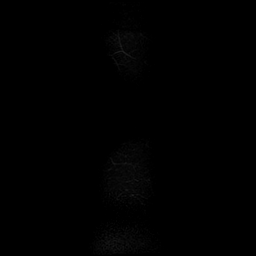
[im 8/30]
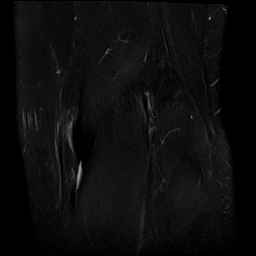
[im 15/30]
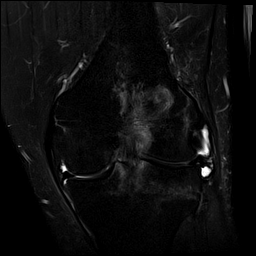
[im 22/30]
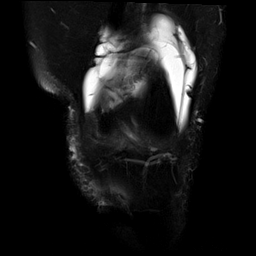
[im 30/30]
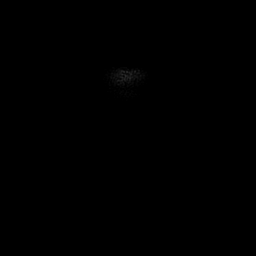

[Series 15: PD · coronal · left · 2.0mm · 0.47mm/px · 3 of 16 slices shown]
[im 1/16]
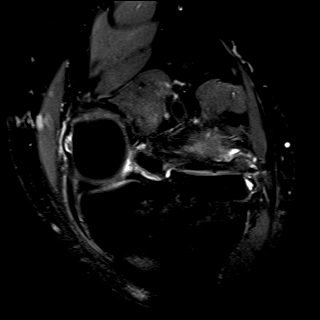
[im 8/16]
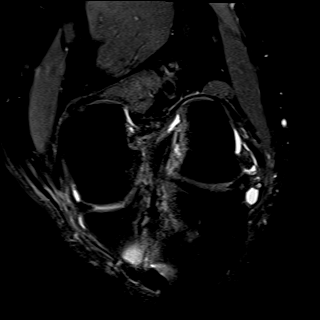
[im 16/16]
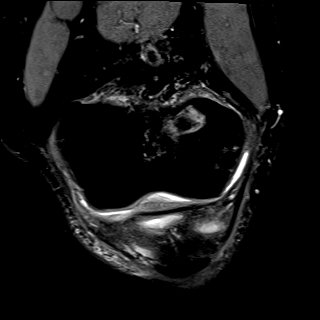

[40 of 40 positions shown; findings below may reference images not displayed]

FINDINGS: MENISCI

Medial: Intact

Lateral: Vertical linear signal abnormality involving the posterior
horn of the lateral meniscus which may reflect prior meniscal repair
versus a longitudinal tear. Small radial tear of the free edge of
the posterior horn of the lateral meniscus. Degeneration of the body
of the lateral meniscus with a small undersurface tear.

LIGAMENTS

Cruciates: Interval ACL repair. ACL graft is intact. ACL graft is
increased in signal. Marrow edema along the femoral and tibial
tunnels. Intact PCL.

Collaterals: Mild thickening of the proximal MCL consistent with
sequela prior MCL injury. MCL is otherwise intact. Lateral
collateral ligament complex is intact.

CARTILAGE

Patellofemoral:  No chondral defect.

Medial: Mild partial-thickness cartilage loss of the medial
femorotibial compartment.

Lateral:  No chondral defect.

JOINT: Large joint effusion. Edema in Hoffa's fat. No plical
thickening.

POPLITEAL FOSSA: Popliteus tendon is intact. No Baker's cyst.

EXTENSOR MECHANISM: Moderate tendinosis of the distal quadriceps
tendon. Intact patellar tendon. Intact lateral patellar retinaculum.
Intact medial patellar retinaculum. Intact MPFL.

BONES: No aggressive osseous lesion. No fracture or dislocation.

Other: No fluid collection or hematoma. Muscles are normal.
IMPRESSION: 1. Prior ACL repair with the ACL graft intact. ACL graft is
increased in signal as can be seen with mucinous degeneration versus
postsurgical changes versus stretch injury. Marrow edema along the
femoral and tibial tunnels likely postsurgical.
2. Vertical linear signal abnormality involving the posterior horn
of the lateral meniscus which may reflect prior meniscal repair
versus a longitudinal tear. Small radial tear of the free edge of
the posterior horn of the lateral meniscus. Degeneration of the body
of the lateral meniscus with a small undersurface tear.
3. Large joint effusion.
4. Moderate tendinosis of the distal quadriceps tendon.

## 2021-09-12 NOTE — Telephone Encounter (Signed)
[name], MD - 08/31/2021 8:15 AM EDT Formatting of this note is different from the original. ORTHOPEDIC SURGERY - KNEE EVALUATION  Chief Complaint: Chief Complaint  Patient presents with   Follow-up  Left knee MRI results   05/23/21: PROCEDURES:  1. Left knee anterior cruciate ligament reconstruction with quadriceps tendon autograft 2. Left lateral meniscus repair 3. Left medial femoral condyle chondroplasty  History of Present Illness: 08/31/21: Video visit performed today to discuss MRI results. He reports no significant change in his symptoms. He has paused physical therapy.  08/24/21: Now ~3 months after above procedures. No significant concerns at this time. No pain at baseline. He does note some persistent swelling. Making progress with PT. Able to perform daily activities.  08/30/21: FINDINGS: MENISCI  Medial: Intact  Lateral: Vertical linear signal abnormality involving the posterior horn of the lateral meniscus which may reflect prior meniscal repair versus a longitudinal tear. Small radial tear of the free edge of the posterior horn of the lateral meniscus. Degeneration of the body of the lateral meniscus with a small undersurface tear.  LIGAMENTS  Cruciates: Interval ACL repair. ACL graft is intact. ACL graft is increased in signal. Marrow edema along the femoral and tibial tunnels. Intact PCL.  Collaterals: Mild thickening of the proximal MCL consistent with sequela prior MCL injury. MCL is otherwise intact. Lateral collateral ligament complex is intact.  CARTILAGE  Patellofemoral: No chondral defect.  Medial: Mild partial-thickness cartilage loss of the medial femorotibial compartment.  Lateral: No chondral defect.  JOINT: Large joint effusion. Edema in Hoffa's fat. No plical thickening.  POPLITEAL FOSSA: Popliteus tendon is intact. No Baker's cyst.  EXTENSOR MECHANISM: Moderate tendinosis of the distal quadriceps tendon. Intact patellar tendon.  Intact lateral patellar retinaculum. Intact medial patellar retinaculum. Intact MPFL.  BONES: No aggressive osseous lesion. No fracture or dislocation.  Other: No fluid collection or hematoma. Muscles are normal.  IMPRESSION: 1. Prior ACL repair with the ACL graft intact. ACL graft is increased in signal as can be seen with mucinous degeneration versus postsurgical changes versus stretch injury. Marrow edema along the femoral and tibial tunnels likely postsurgical. 2. Vertical linear signal abnormality involving the posterior horn of the lateral meniscus which may reflect prior meniscal repair versus a longitudinal tear. Small radial tear of the free edge of the posterior horn of the lateral meniscus. Degeneration of the body of the lateral meniscus with a small undersurface tear. 3. Large joint effusion. 4. Moderate tendinosis of the distal quadriceps tendon.  On personal read of the MRI, ACL graft appears intact, although there is some mild waviness to the fibers. Lateral meniscus does not appear retorn and changes appear to be postsurgical in nature.  I personally reviewed and visualized the aforementioned imaging studies. I additionally personally interpreted any radiographs taken during today's visit.  Assessment & Plan: There are no diagnoses linked to this encounter.  Darrell Kennedy is a 41 y.o. male patient ~3.5 months after undergoing left ACL reconstruction using quadriceps tendon autograft and lateral meniscus repair. He has no significant pain currently. Repeat MRI does not show gross ACL or lateral meniscus root tear. 1. Continue daily activities as tolerated.  2. We agreed to continue rehabilitation. Patient can restart physical therapy per ACL reconstruction and lateral meniscus repair rehab protocol  3. Follow-up with me Signa Kell) in approximately 6 weeks. Patient can plan to start prerunning program over the next few weeks.

## 2021-09-22 ENCOUNTER — Ambulatory Visit: Payer: No Typology Code available for payment source | Admitting: Infectious Diseases

## 2021-09-22 ENCOUNTER — Other Ambulatory Visit: Payer: Self-pay | Admitting: Registered Nurse

## 2021-09-23 ENCOUNTER — Encounter: Payer: Self-pay | Admitting: Registered Nurse

## 2021-09-23 ENCOUNTER — Telehealth: Payer: Self-pay | Admitting: Registered Nurse

## 2021-09-23 DIAGNOSIS — R7989 Other specified abnormal findings of blood chemistry: Secondary | ICD-10-CM

## 2021-09-23 LAB — CMP12+LP+TP+TSH+6AC+CBC/D/PLT
ALT: 19 IU/L (ref 0–44)
AST: 19 IU/L (ref 0–40)
Albumin/Globulin Ratio: 1.6 (ref 1.2–2.2)
Albumin: 4.7 g/dL (ref 4.0–5.0)
Alkaline Phosphatase: 76 IU/L (ref 44–121)
BUN/Creatinine Ratio: 8 — ABNORMAL LOW (ref 9–20)
BUN: 12 mg/dL (ref 6–20)
Basophils Absolute: 0.1 10*3/uL (ref 0.0–0.2)
Basos: 1 %
Bilirubin Total: 0.7 mg/dL (ref 0.0–1.2)
Calcium: 10 mg/dL (ref 8.7–10.2)
Chloride: 99 mmol/L (ref 96–106)
Chol/HDL Ratio: 3.5 ratio (ref 0.0–5.0)
Cholesterol, Total: 159 mg/dL (ref 100–199)
Creatinine, Ser: 1.56 mg/dL — ABNORMAL HIGH (ref 0.76–1.27)
EOS (ABSOLUTE): 0.3 10*3/uL (ref 0.0–0.4)
Eos: 4 %
Estimated CHD Risk: 0.5 times avg. (ref 0.0–1.0)
Free Thyroxine Index: 2.4 (ref 1.2–4.9)
GGT: 17 IU/L (ref 0–65)
Globulin, Total: 2.9 g/dL (ref 1.5–4.5)
Glucose: 94 mg/dL (ref 70–99)
HDL: 46 mg/dL (ref >39)
Hematocrit: 45.2 % (ref 37.5–51.0)
Hemoglobin: 15 g/dL (ref 13.0–17.7)
Immature Grans (Abs): 0 10*3/uL (ref 0.0–0.1)
Immature Granulocytes: 0 %
Iron: 142 ug/dL (ref 38–169)
LDH: 203 IU/L (ref 121–224)
LDL Chol Calc (NIH): 91 mg/dL (ref 0–99)
Lymphocytes Absolute: 4.1 10*3/uL — ABNORMAL HIGH (ref 0.7–3.1)
Lymphs: 51 %
MCH: 29.6 pg (ref 26.6–33.0)
MCHC: 33.2 g/dL (ref 31.5–35.7)
MCV: 89 fL (ref 79–97)
Monocytes Absolute: 0.6 10*3/uL (ref 0.1–0.9)
Monocytes: 7 %
Neutrophils Absolute: 3 10*3/uL (ref 1.4–7.0)
Neutrophils: 37 %
Phosphorus: 3.7 mg/dL (ref 2.8–4.1)
Platelets: 325 10*3/uL (ref 150–450)
Potassium: 4.4 mmol/L (ref 3.5–5.2)
RBC: 5.06 x10E6/uL (ref 4.14–5.80)
RDW: 12 % (ref 11.6–15.4)
Sodium: 138 mmol/L (ref 134–144)
T3 Uptake Ratio: 27 % (ref 24–39)
T4, Total: 8.8 ug/dL (ref 4.5–12.0)
TSH: 1.24 u[IU]/mL (ref 0.450–4.500)
Total Protein: 7.6 g/dL (ref 6.0–8.5)
Triglycerides: 122 mg/dL (ref 0–149)
Uric Acid: 6.1 mg/dL (ref 3.8–8.4)
VLDL Cholesterol Cal: 22 mg/dL (ref 5–40)
WBC: 8.1 10*3/uL (ref 3.4–10.8)
eGFR: 58 mL/min/{1.73_m2} — ABNORMAL LOW (ref >59)

## 2021-09-23 LAB — VITAMIN D 25 HYDROXY (VIT D DEFICIENCY, FRACTURES): Vit D, 25-Hydroxy: 45 ng/mL (ref 30.0–100.0)

## 2021-09-23 LAB — HGB A1C W/O EAG: Hgb A1c MFr Bld: 5.3 % (ref 4.8–5.6)

## 2021-09-23 NOTE — Telephone Encounter (Signed)
[facility] labs drawn yesterday [facility] portal reviewed and compared to Epic historical results  serum creatinine and egfr worsening   Please review medication list with patient discuss BP check today as last check on file January 124/70, avoid dehydration, OTC medications/supplements, drink water to keep urine pale yellow clear and voiding every 4 hours while awake.  Repeat nonfasting BMET in 2 months. Follow up PCM for elevated lymphocytes, Cr, and low GFR.   I recommend exercise 150 minutes per week; dietary fiber 30 grams per day men; eat whole grains/fruits/vegetables; keep added sugars to less than 150 calories/9 teaspoons for men per American Heart Association; blood sugar, vitamin D, cholesterol, electrolytes, iron, liver/thryoid function normal and no anemia on complete blood count    Please give CKD handout and diet, complete blood count.   Glucose  94 mg/dL 70 - 99 Uric Acid  6.1                           Therapeutic target for gout patients: <6.0 mg/dL 3.8 - 8.4 BUN  12 mg/dL 6 - 20 Creatinine  1.56 High mg/dL 0.76 - 1.27 eGFR  58 Low mL/min/1.73  BUN/Creatinine Ratio  8 Low  9 - 20 Sodium  138 mmol/L 134 - 144 Potassium  4.4 mmol/L 3.5 - 5.2 Chloride  99 mmol/L 96 - 106 Calcium  10.0 mg/dL 8.7 - 10.2 Phosphorus  3.7 mg/dL 2.8 - 4.1 Protein, Total  7.6 g/dL 6.0 - 8.5 Albumin  4.7 g/dL 4.0 - 5.0 Globulin, Total  2.9 g/dL 1.5 - 4.5 A/G Ratio  1.6 1.2 - 2.2 Bilirubin, Total  0.7 mg/dL 0.0 - 1.2 Alkaline Phosphatase  76 IU/L 44 - 121 LDH  203 IU/L 121 - 224 AST (SGOT)  19 IU/L 0 - 40 ALT (SGPT)  19 IU/L 0 - 44 GGT  17 IU/L 0 - 65 Iron  142 ug/dL 38 - 169 .  Lipids  Cholesterol, Total  159 mg/dL 100 - 199 Triglycerides  122 mg/dL 0 - 149 HDL Cholesterol  46  mg/dL  VLDL Cholesterol Cal  22 mg/dL 5 - 40 LDL Chol Calc (NIH)  91 mg/dL 0 - 99 T. Chol/HDL Ratio  3.5 ratio 0.0 - 5.0 Please Note:                                                    T. Chol/HDL Ratio                                                            Men  Women                                              1/2 Avg.Risk  3.4    3.3  Avg.Risk  5.0    4.4                                               2X Avg.Risk  9.6    7.1                                               3X Avg.Risk 23.4   11.0 Estimated CHD Risk  0.5                The CHD Risk is based on the T. Chol/HDL ratio. Other                factors affect CHD Risk such as hypertension, smoking,                diabetes, severe obesity, and family history of                premature CHD. times avg. 0.0 - 1.0 .  Thyroid  TSH  1.240 uIU/mL 0.450 - 4.500 Thyroxine (T4)  8.8 ug/dL 4.5 - 12.0 T3 Uptake  27 % 24 - 39 Free Thyroxine Index  2.4 1.2 - 4.9 .  CBC, Platelet Ct, and Diff  WBC  8.1 x10E3/uL 3.4 - 10.8 RBC  5.06 x10E6/uL 4.14 - 5.80 Hemoglobin  15.0 g/dL 13.0 - 17.7 Hematocrit  45.2 % 37.5 - 51.0 MCV  89 fL 79 - 97 MCH  29.6 pg 26.6 - 33.0 MCHC  33.2 g/dL 31.5 - 35.7 RDW  12.0 % 11.6 - 15.4 Platelets  325 x10E3/uL 150 - 450 Neutrophils  37  %  Not Estab. Lymphs  51  %  Not Estab. Monocytes  7  %  Not Estab. Eos  4  %  Not Estab. Basos  1  %  Not Estab. Neutrophils (Absolute)  3.0 x10E3/uL 1.4 - 7.0 Lymphs (Absolute)  4.1 High x10E3/uL 0.7 - 3.1 Monocytes(Absolute)  0.6 x10E3/uL 0.1 - 0.9 Eos (Absolute)  0.3 x10E3/uL 0.0 - 0.4 Baso (Absolute)  0.1 x10E3/uL 0.0 - 0.2 Immature Granulocytes  0  %  Not Estab. Immature Grans (Abs)  0.0 x10E3/uL 0.0 - 0.1 Hemoglobin A1c (#[id number]) Test Results Flag Units Reference Interval Hemoglobin A1c  5.3 % 4.8 - 5.6 Please Note:                                                         .          Prediabetes: 5.7 - 6.4          Diabetes: >6.4          Glycemic control for adults with diabetes: <7.0 Vitamin D, 25-Hydroxy  (#[id number]) Test Results Flag Units Reference Interval Vitamin D, 25-Hydroxy  45.0 Vitamin D deficiency has been defined by the [place] practice guideline as a level of serum 25-OH vitamin D less than 20 ng/mL (1,2). The Endocrine Society went on to further define vitamin D insufficiency as a level between 21 and 29 ng/mL (2). 1. IOM (Institute  of Medicine). 2010. Dietary reference    intakes for calcium and D. [place]: The    [employer]. 2. Holick MF, Binkley [place], Bischoff-Ferrari HA, et al.    Evaluation, treatment, and prevention of vitamin D    deficiency: an Endocrine Society clinical practice    guideline. JCEM. 2011 Jul; 96(7):1911-30.   Latest Reference Range & Units 03/18/20 12:06 09/21/20 10:49 11/05/20 09:15 02/21/21 10:41 03/24/21 10:00  COMPREHENSIVE METABOLIC PANEL  Rpt ! Rpt !  Rpt ! Rpt !  Sodium 135 - 145 mmol/L 135 137 140 139 135  Potassium 3.5 - 5.1 mmol/L 3.9 3.9 4.4 4.7 4.1  Chloride 98 - 111 mmol/L 97 (L) 102 101 99 102  CO2 22 - 32 mmol/L $RemoveB'26 25  24 26  'ywipRLDh$ Glucose 70 - 99 mg/dL 101 (H) 103 (H) 90 100 (H) 104 (H)  BUN 6 - 20 mg/dL $Remove'16 15 14 16 17  'SLMVJyF$ Creatinine 0.61 - 1.24 mg/dL 1.30 (H) 1.39 (H) 1.45 (H) 1.08 1.30 (H)  Calcium 8.9 - 10.3 mg/dL 9.8 9.1 9.0 9.5 9.7  Anion gap 5 - $R'15  12 10   7  'mI$ BUN/Creatinine Ratio 9 - $R'20    10 15   'es$ eGFR >59 mL/min/1.73   63 90   Phosphorus 2.8 - 4.1 mg/dL   2.6 (L)    Alkaline Phosphatase 38 - 126 U/L 62 56 78 78 55  Albumin 3.5 - 5.0 g/dL 4.7 4.3 4.7 4.7 4.6  Albumin/Globulin Ratio 1.2 - 2.2    2.0 1.7   Uric Acid 3.8 - 8.4 mg/dL   6.3    AST 15 - 41 U/L $Remo'22 22 25 'CKJIf$ 61 (H) 19  ALT 0 - 44 U/L $Remo'23 21 20 'QmlvF$ 53 (H) 18  Total Protein 6.5 - 8.1 g/dL 8.0 7.1 7.1 7.5 7.7  Total Bilirubin 0.3 - 1.2 mg/dL 0.9 0.8 0.4 0.4 0.7  GGT 0 - 65 IU/L   13    GFR, Estimated >60 mL/min >60 >60   >60  !: Data is abnormal (L): Data is abnormally low (H): Data is abnormally high Rpt: View report in  Results Review for more information

## 2021-09-25 ENCOUNTER — Encounter: Payer: Self-pay | Admitting: Registered Nurse

## 2021-09-26 ENCOUNTER — Ambulatory Visit: Payer: Self-pay | Admitting: *Deleted

## 2021-09-26 VITALS — BP 103/74 | Ht 66.0 in | Wt 171.0 lb

## 2021-09-26 DIAGNOSIS — Z0000 Encounter for general adult medical examination without abnormal findings: Secondary | ICD-10-CM

## 2021-09-26 NOTE — Progress Notes (Signed)
Be Well insurance premium discount evaluation: Labs Drawn onsite previously by [facility]. Replacements ROI form signed. Tobacco Free Attestation form signed.  Forms placed in paper chart.

## 2021-10-02 ENCOUNTER — Other Ambulatory Visit: Payer: Self-pay | Admitting: Psychiatry

## 2021-10-02 DIAGNOSIS — F3181 Bipolar II disorder: Secondary | ICD-10-CM

## 2021-10-05 NOTE — Telephone Encounter (Signed)
Patient had BP recheck with RN Rolly Salter 09/26/21, signed Be Well paperwork and reviewed labs with her.   Epic confirmation patient reviewed lab results via my chart

## 2021-10-06 ENCOUNTER — Encounter: Payer: Self-pay | Admitting: Infectious Diseases

## 2021-10-06 ENCOUNTER — Other Ambulatory Visit
Admission: RE | Admit: 2021-10-06 | Discharge: 2021-10-06 | Disposition: A | Discharge status: Home / Self Care | Payer: No Typology Code available for payment source | Attending: Infectious Diseases | Admitting: Infectious Diseases

## 2021-10-06 ENCOUNTER — Ambulatory Visit: Payer: No Typology Code available for payment source | Attending: Infectious Diseases | Admitting: Infectious Diseases

## 2021-10-06 VITALS — BP 120/79 | HR 65 | Temp 97.9°F | Ht 66.0 in | Wt 176.0 lb

## 2021-10-06 DIAGNOSIS — E785 Hyperlipidemia, unspecified: Secondary | ICD-10-CM | POA: Diagnosis not present

## 2021-10-06 DIAGNOSIS — B20 Human immunodeficiency virus [HIV] disease: Secondary | ICD-10-CM | POA: Insufficient documentation

## 2021-10-06 DIAGNOSIS — Z7902 Long term (current) use of antithrombotics/antiplatelets: Secondary | ICD-10-CM | POA: Diagnosis not present

## 2021-10-06 DIAGNOSIS — Z8673 Personal history of transient ischemic attack (TIA), and cerebral infarction without residual deficits: Secondary | ICD-10-CM | POA: Diagnosis not present

## 2021-10-06 DIAGNOSIS — D6851 Activated protein C resistance: Secondary | ICD-10-CM | POA: Diagnosis not present

## 2021-10-06 DIAGNOSIS — F319 Bipolar disorder, unspecified: Secondary | ICD-10-CM | POA: Diagnosis not present

## 2021-10-06 DIAGNOSIS — R768 Other specified abnormal immunological findings in serum: Secondary | ICD-10-CM | POA: Insufficient documentation

## 2021-10-06 DIAGNOSIS — D6862 Lupus anticoagulant syndrome: Secondary | ICD-10-CM | POA: Insufficient documentation

## 2021-10-06 DIAGNOSIS — R7989 Other specified abnormal findings of blood chemistry: Secondary | ICD-10-CM | POA: Insufficient documentation

## 2021-10-06 DIAGNOSIS — D6861 Antiphospholipid syndrome: Secondary | ICD-10-CM | POA: Insufficient documentation

## 2021-10-06 LAB — BASIC METABOLIC PANEL
Anion gap: 8 (ref 5–15)
BUN: 16 mg/dL (ref 6–20)
CO2: 27 mmol/L (ref 22–32)
Calcium: 10.3 mg/dL (ref 8.9–10.3)
Chloride: 103 mmol/L (ref 98–111)
Creatinine, Ser: 1.31 mg/dL — ABNORMAL HIGH (ref 0.61–1.24)
GFR, Estimated: >60 mL/min (ref >60)
Glucose, Bld: 103 mg/dL — ABNORMAL HIGH (ref 70–99)
Potassium: 3.9 mmol/L (ref 3.5–5.1)
Sodium: 138 mmol/L (ref 135–145)

## 2021-10-06 LAB — CHLAMYDIA/NGC RT PCR (ARMC ONLY)
Chlamydia Tr: NOT DETECTED
Chlamydia Tr: NOT DETECTED
Chlamydia Tr: NOT DETECTED
N gonorrhoeae: NOT DETECTED
N gonorrhoeae: NOT DETECTED
N gonorrhoeae: NOT DETECTED

## 2021-10-06 MED ORDER — DOVATO 50-300 MG PO TABS: 1.0000 | ORAL_TABLET | Freq: Every day | ORAL | 1 refills | Status: DC

## 2021-10-06 NOTE — Patient Instructions (Addendum)
You are here for follow up - today will do labs and then will change you to Dovato 1 tablet once a day- will discontinue Biktarvy.follow up of  1 month

## 2021-10-06 NOTE — Progress Notes (Signed)
Late entry: Results reviewed with pt in clinic. Diet and exercise recommendations discussed re: creatinine (possibly r/t medication), health maintenance. Routine f/u with pcp. Copy of labs provided to pt. Results routed to pcp per pt request. No further questions/concerns.

## 2021-10-07 ENCOUNTER — Other Ambulatory Visit (HOSPITAL_COMMUNITY)
Admission: RE | Admit: 2021-10-07 | Discharge: 2021-10-07 | Disposition: A | Discharge status: Home / Self Care | Payer: No Typology Code available for payment source | Source: Ambulatory Visit | Attending: Infectious Diseases | Admitting: Infectious Diseases

## 2021-10-07 ENCOUNTER — Other Ambulatory Visit: Payer: Self-pay

## 2021-10-07 DIAGNOSIS — B20 Human immunodeficiency virus [HIV] disease: Secondary | ICD-10-CM | POA: Insufficient documentation

## 2021-10-07 LAB — HIV-1 RNA QUANT-NO REFLEX-BLD
HIV 1 RNA Quant: <20 copies/mL
LOG10 HIV-1 RNA: UNDETERMINED log10copy/mL

## 2021-10-11 ENCOUNTER — Telehealth: Payer: Self-pay

## 2021-10-11 LAB — CYTOLOGY - PAP: Diagnosis: REACTIVE

## 2021-10-13 ENCOUNTER — Telehealth: Payer: Self-pay | Admitting: Psychiatry

## 2021-10-13 DIAGNOSIS — F5105 Insomnia due to other mental disorder: Secondary | ICD-10-CM

## 2021-10-13 MED ORDER — ZOLPIDEM TARTRATE ER 12.5 MG PO TBCR: EXTENDED_RELEASE_TABLET | ORAL | 0 refills | Status: DC

## 2021-10-13 NOTE — Telephone Encounter (Signed)
Pt has f/u on 11-02-21 and needs a refill on medication, Ambien. Asking if he can get prior to the appointment.

## 2021-10-13 NOTE — Telephone Encounter (Signed)
I have sent Ambien 12.5 mg - 21 tablets-to last until his appointment.  I have sent it to [facility].

## 2021-10-17 ENCOUNTER — Telehealth: Payer: Self-pay

## 2021-10-17 NOTE — Telephone Encounter (Signed)
-----   Message from Lynn Ito, MD sent at 10/14/2021  5:54 PM EDT ----- Please let him know that his test results are fine- Cd4 could not be done due to lab error ----- Message ----- From: Interface, Lab [facility] Sent: 10/11/2021   5:30 PM EDT To: Lynn Ito, MD

## 2021-10-17 NOTE — Telephone Encounter (Signed)
Called patient regarding lab results. Understands Cd4 was not done due to lab error. Patient verbalized understanding. Juanita Laster, RMA

## 2021-10-27 LAB — MISC LABCORP TEST (SEND OUT)
Labcorp test code: 551776
Source (LabCorp): 551776

## 2021-11-02 ENCOUNTER — Encounter: Payer: Self-pay | Admitting: Psychiatry

## 2021-11-02 ENCOUNTER — Ambulatory Visit (INDEPENDENT_AMBULATORY_CARE_PROVIDER_SITE_OTHER): Payer: No Typology Code available for payment source | Admitting: Psychiatry

## 2021-11-02 VITALS — BP 141/83 | HR 61 | Temp 98.7°F | Wt 169.8 lb

## 2021-11-02 DIAGNOSIS — F411 Generalized anxiety disorder: Secondary | ICD-10-CM

## 2021-11-02 DIAGNOSIS — F5105 Insomnia due to other mental disorder: Secondary | ICD-10-CM | POA: Diagnosis not present

## 2021-11-02 DIAGNOSIS — F3181 Bipolar II disorder: Secondary | ICD-10-CM

## 2021-11-02 DIAGNOSIS — R41840 Attention and concentration deficit: Secondary | ICD-10-CM | POA: Diagnosis not present

## 2021-11-02 MED ORDER — LAMOTRIGINE 150 MG PO TABS: ORAL_TABLET | ORAL | 0 refills | Status: DC

## 2021-11-02 MED ORDER — ZOLPIDEM TARTRATE ER 12.5 MG PO TBCR: EXTENDED_RELEASE_TABLET | ORAL | 2 refills | Status: DC

## 2021-11-02 NOTE — Progress Notes (Signed)
[facility] MD OP Progress Note  11/02/2021 5:06 PM Darrell Kennedy  MRN:  [id number]  Chief Complaint:  Chief Complaint  Patient presents with   Follow-up: 41 year old Caucasian male with history of bipolar disorder type II, insomnia, attention and concentration deficit, presented for medication management.   HPI: Darrell Kennedy is a 41 year old Caucasian male, lives in [place], has a history of bipolar disorder, insomnia, hyperlipidemia, HIV positive was evaluated in office today.  Patient today reports he looks forward to celebrating his birthday next week.  Planning to take a trip to this beach near [place].  Planning to spend time with his boyfriend.    Overall mood symptoms has been stable.  Denies any hypomanic or depressive symptoms.  Denies any significant anxiety.  Reports sleep as good as long as he takes the Ambien.  Denies side effects.  Currently compliant on the Lamictal.  Denies side effects.  Did have recent renal function abnormalities, and Biktarvy was changed to Dovato.  Planning to get repeat renal function test tomorrow.  Follows up with his infectious disease provider.  Reports work is going well.  Denies any other concerns today.  Visit Diagnosis:    ICD-10-CM   1. Bipolar II disorder, moderate, hypomanic, with mixed features, in full remission (HCC)  F31.81 lamoTRIgine (LAMICTAL) 150 MG tablet   hypomanic , mixed features    2. Generalized anxiety disorder  F41.1     3. Insomnia due to mental condition  F51.05 zolpidem (AMBIEN CR) 12.5 MG CR tablet   mood    4. Attention and concentration deficit  R41.840       Past Psychiatric History: Reviewed past psychiatric history from progress note on 02/06/2019.  Past trials of BuSpar, Seroquel.  Patient had neuropsychological testing completed-07/26/2021-Dr. Rodenbough-patient did not meet criteria for ADHD.  Past Medical History:  Past Medical History:  Diagnosis Date   Anxiety    Depression    Heart murmur     HIV (human immunodeficiency virus infection) (HCC)    HLD (hyperlipidemia)    Stroke [facility])     Past Surgical History:  Procedure Laterality Date   KNEE ARTHROSCOPY WITH ANTERIOR CRUCIATE LIGAMENT (ACL) REPAIR WITH HAMSTRING GRAFT Left 05/23/2021   Procedure: Left arthroscopic ACL reconstruction using quadriceps tendon autograft, lateral meniscus repair, chondroplasty of the medial femoral condyle;  Surgeon: Signa Kell, MD;  Location: [facility] ORS;  Service: Orthopedics;  Laterality: Left;   MOUTH SURGERY  2021   [facility]   TEE WITHOUT CARDIOVERSION N/A 03/05/2019   Procedure: TRANSESOPHAGEAL ECHOCARDIOGRAM (TEE);  Surgeon: Lamar Blinks, MD;  Location: [facility] ORS;  Service: Cardiovascular;  Laterality: N/A;    Family Psychiatric History: Reviewed family psychiatric history from progress note on 02/06/2019.  Family History:  Family History  Problem Relation Age of Onset   Hypertension Father    Diabetes Father    Kidney disease Father    Depression Father    Hyperlipidemia Father    Alcohol abuse Brother    Heart disease Paternal Grandmother    Drug abuse Cousin     Social History: Reviewed social history from progress note on 02/06/2019. Social History   Socioeconomic History   Marital status: Single    Spouse name: Not on file   Number of children: 0   Years of education: Not on file   Highest education level: Master's degree (e.g., MA, MS, MEng, MEd, MSW, MBA)  Occupational History   Occupation: student  Tobacco Use   Smoking status:  Never   Smokeless tobacco: Never   Tobacco comments:    None  Vaping Use   Vaping Use: Never used  Substance and Sexual Activity   Alcohol use: Yes    Alcohol/week: 0.0 standard drinks of alcohol    Comment: social maybe once a week   Drug use: Never   Sexual activity: Yes    Birth control/protection: Condom  Other Topics Concern   Not on file  Social History Narrative   Not on file   Social Determinants of Health    Financial Resource Strain: Low Risk  (03/30/2020)   Overall Financial Resource Strain (CARDIA)    Difficulty of Paying Living Expenses: Not hard at all  Food Insecurity: No Food Insecurity (03/30/2020)   Hunger Vital Sign    Worried About Running Out of Food in the Last Year: Never true    Ran Out of Food in the Last Year: Never true  Transportation Needs: No Transportation Needs (03/30/2020)   PRAPARE - Administrator, Civil Service (Medical): No    Lack of Transportation (Non-Medical): No  Physical Activity: Sufficiently Active (03/30/2020)   Exercise Vital Sign    Days of Exercise per Week: 5 days    Minutes of Exercise per Session: 50 min  Stress: No Stress Concern Present (03/30/2020)   [employer] of Occupational Health - Occupational Stress Questionnaire    Feeling of Stress : Not at all  Social Connections: Socially Isolated (03/30/2020)   Social Connection and Isolation Panel [NHANES]    Frequency of Communication with Friends and Family: More than three times a week    Frequency of Social Gatherings with Friends and Family: Once a week    Attends Religious Services: Never    Database administrator or Organizations: No    Attends Banker Meetings: Never    Marital Status: Never married    Allergies:  Allergies  Allergen Reactions   Amoxicillin Rash    Metabolic Disorder Labs: Lab Results  Component Value Date   HGBA1C 5.3 09/22/2021   MPG 114.02 02/12/2019   No results found for: "PROLACTIN" Lab Results  Component Value Date   CHOL 159 09/22/2021   TRIG 122 09/22/2021   HDL 46 09/22/2021   CHOLHDL 3.5 09/22/2021   VLDL 14 09/23/2019   LDLCALC 91 09/22/2021   LDLCALC 168 (H) 11/05/2020   Lab Results  Component Value Date   TSH 1.240 09/22/2021   TSH 0.965 11/05/2020    Therapeutic Level Labs: No results found for: "LITHIUM" No results found for: "VALPROATE" No results found for: "CBMZ"  Current  Medications: Current Outpatient Medications  Medication Sig Dispense Refill   atorvastatin (LIPITOR) 20 MG tablet Take 20 mg by mouth daily.     dolutegravir-lamiVUDine (DOVATO) 50-300 MG tablet Take 1 tablet by mouth daily. 30 tablet 1   hydrOXYzine (ATARAX) 25 MG tablet Take 0.5-1 tablets (12.5-25 mg total) by mouth 2 (two) times daily as needed. For severe anxiety only 60 tablet 1   ketoconazole (NIZORAL) 2 % shampoo Apply 1 application topically 2 (two) times a week.     lamoTRIgine (LAMICTAL) 150 MG tablet TAKE 1 TABLET(150 MG) BY MOUTH TWICE DAILY 180 tablet 0   zolpidem (AMBIEN CR) 12.5 MG CR tablet TAKE 1 TABLET(12.5 MG) BY MOUTH AT BEDTIME AS NEEDED FOR SLEEP 30 tablet 2   No current facility-administered medications for this visit.     Musculoskeletal: Strength & Muscle Tone: within normal limits Gait &  Station: normal Patient leans: N/A  Psychiatric Specialty Exam: Review of Systems  Musculoskeletal:        Mild knee pain - Left side S/P surgery- tolerable  Psychiatric/Behavioral: Negative.    All other systems reviewed and are negative.   Blood pressure (!) 141/83, pulse 61, temperature 98.7 F (37.1 C), temperature source Temporal, weight 169 lb 12.8 oz (77 kg).Body mass index is 27.41 kg/m.  General Appearance: Casual  Eye Contact:  Fair  Speech:  Clear and Coherent  Volume:  Normal  Mood:  Euthymic  Affect:  Congruent  Thought Process:  Goal Directed and Descriptions of Associations: Intact  Orientation:  Full (Time, Place, and Person)  Thought Content: Logical   Suicidal Thoughts:  No  Homicidal Thoughts:  No  Memory:  Immediate;   Fair Recent;   Fair Remote;   Fair  Judgement:  Fair  Insight:  Fair  Psychomotor Activity:  Normal  Concentration:  Concentration: Fair and Attention Span: Fair  Recall:  [employer] of Knowledge: Fair  Language: Fair  Akathisia:  No  Handed:  Right  AIMS (if indicated): not done  Assets:  Communication Skills Desire  for Improvement Housing Social Support Talents/Skills Transportation  ADL's:  Intact  Cognition: WNL  Sleep:  Fair   Screenings: AUDIT    Flowsheet Row Office Visit from 10/07/2018 in [facility]  Alcohol Use Disorder Identification Test Final Score (AUDIT) 3      GAD-7    Flowsheet Row Office Visit from 11/02/2021 in [facility] Office Visit from 06/29/2021 in [facility] Video Visit from 03/31/2021 in [facility] Video Visit from 09/15/2020 in [facility]  Total GAD-7 Score 0 12 0 2      PHQ2-9    Flowsheet Row Office Visit from 11/02/2021 in [facility] Office Visit from 10/06/2021 in [facility] Office Visit from 06/29/2021 in [facility] Video Visit from 03/31/2021 in [facility] Video Visit from 12/30/2020 in [facility]  PHQ-2 Total Score 0 0 2 3 0  PHQ-9 Total Score -- -- 10 10 5       Flowsheet Row Office Visit from 06/29/2021 in [facility] Admission (Discharged) from 05/23/2021 in [facility] Pre-Admission Testing 45 from 05/18/2021 in [facility] PRE ADMISSION TESTING  C-SSRS RISK CATEGORY No Risk No Risk No Risk        Assessment and Plan: Darrell Kennedy is a 41 year old Caucasian male, employed, lives in [place], has a history of bipolar disorder, hyperlipidemia, HIV positive, was evaluated in the office today.  Patient is currently stable.  Plan Bipolar disorder type II most recent episode hypomanic mixed features-in remission Lamotrigine 300 mg p.o. daily in divided dosage.  GAD-stable Hydroxyzine 12.5-25 mg p.o. twice daily as needed Continue CBT as needed-Mr. 05-17-1983.  Insomnia-stable Ambien CR  12.5 mg p.o. nightly as needed  Attention and concentration deficit-improving Reviewed neuropsychological testing per Dr. Varney Daily 07/26/2021-patient did not meet criteria for ADHD.  Rather his attention and focus deficit likely due to sleep problems, psychiatric diagnosis of bipolar disorder as well as being on medications-CNS depressants.'  Follow-up in clinic in 3 to 4 months or sooner if needed.  This note was generated in part or whole with voice recognition software. Voice recognition is usually quite accurate but there are transcription errors that can and very  often do occur. I apologize for any typographical errors that were not detected and corrected.      Jomarie Longs, MD 11/03/2021, 9:02 AM

## 2021-11-03 ENCOUNTER — Encounter: Payer: Self-pay | Admitting: Infectious Diseases

## 2021-11-03 ENCOUNTER — Other Ambulatory Visit
Admission: RE | Admit: 2021-11-03 | Discharge: 2021-11-03 | Disposition: A | Discharge status: Home / Self Care | Payer: No Typology Code available for payment source | Attending: Infectious Diseases | Admitting: Infectious Diseases

## 2021-11-03 ENCOUNTER — Ambulatory Visit: Payer: No Typology Code available for payment source | Attending: Infectious Diseases | Admitting: Infectious Diseases

## 2021-11-03 VITALS — BP 127/84 | HR 66 | Temp 98.1°F | Ht 66.0 in | Wt 171.0 lb

## 2021-11-03 DIAGNOSIS — F319 Bipolar disorder, unspecified: Secondary | ICD-10-CM | POA: Diagnosis not present

## 2021-11-03 DIAGNOSIS — E785 Hyperlipidemia, unspecified: Secondary | ICD-10-CM | POA: Insufficient documentation

## 2021-11-03 DIAGNOSIS — Z79624 Long term (current) use of inhibitors of nucleotide synthesis: Secondary | ICD-10-CM | POA: Diagnosis present

## 2021-11-03 DIAGNOSIS — Z79899 Other long term (current) drug therapy: Secondary | ICD-10-CM | POA: Diagnosis not present

## 2021-11-03 DIAGNOSIS — Z21 Asymptomatic human immunodeficiency virus [HIV] infection status: Secondary | ICD-10-CM | POA: Diagnosis not present

## 2021-11-03 DIAGNOSIS — R768 Other specified abnormal immunological findings in serum: Secondary | ICD-10-CM | POA: Insufficient documentation

## 2021-11-03 DIAGNOSIS — B20 Human immunodeficiency virus [HIV] disease: Secondary | ICD-10-CM

## 2021-11-03 DIAGNOSIS — Z8673 Personal history of transient ischemic attack (TIA), and cerebral infarction without residual deficits: Secondary | ICD-10-CM | POA: Diagnosis not present

## 2021-11-03 LAB — BASIC METABOLIC PANEL
Anion gap: 7 (ref 5–15)
BUN: 16 mg/dL (ref 6–20)
CO2: 29 mmol/L (ref 22–32)
Calcium: 10 mg/dL (ref 8.9–10.3)
Chloride: 104 mmol/L (ref 98–111)
Creatinine, Ser: 1.56 mg/dL — ABNORMAL HIGH (ref 0.61–1.24)
GFR, Estimated: 58 mL/min — ABNORMAL LOW (ref >60)
Glucose, Bld: 101 mg/dL — ABNORMAL HIGH (ref 70–99)
Potassium: 4 mmol/L (ref 3.5–5.1)
Sodium: 140 mmol/L (ref 135–145)

## 2021-11-03 MED ORDER — DOXYCYCLINE MONOHYDRATE 100 MG PO TABS: 200.0000 mg | ORAL_TABLET | Freq: Every day | ORAL | 0 refills | Status: DC

## 2021-11-03 NOTE — Patient Instructions (Signed)
You are here to do labs after being on dovato-= also giving Doxy prep for STD prevention for your vacation

## 2021-11-03 NOTE — Progress Notes (Signed)
NAME: Darrell Kennedy  DOB: [birth year]  MRN: [id number]  Date/Time: 11/03/2021 12:17 PM  Subjective:  Follow up HIV visit ? Darrell Kennedy is a 41 y.o. with a history of HIV He is here for follow-up after changing his HIV medicine from [place] to [facility].  This was to exclude tenofovir as he was having some renal insufficiency. Is here for follow up. No complaints.  Doing well on Dovato.  No side effects History screening and anal Pap done last visit are all normal   Following taken from previous notes Followed by psychiatrist Dr.Eppen for Bipolar disorder which is well controlled.   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 Was transient. underwent work up with Neg MRI, ECHO, TEE which did not show any intracardiac shunting. He saw neurologist Dr.Shah at the [facility] clinic on 02/17/19  and he sent hypercoagulable panel ESR, CRP - Factor V Leiden mutation - Antithrombin III functional assay - MTHFR gene mutation - Protein C and S activity (functional assay) - Homocysteine - Lupus anticoagulant comprehensive - Antiphospholipid syndrome (includes aPTT, PT, INR, Thrombin time, Dilute Viper Venom Time, Hexagonal phase phospholipid, Anticoardiolipin antibody IgG and IgM, Beta - 2 Glycoprotein IgG and IgM,   Anticardiolipin IgM  ab was elevated at 13 ( 0-12 ) N and homocysteine was 16( 0-14.5) Started on methylfolate  and aspirin . seen heme once Dr.Finnegan .    HIV history HIV diagnosed NOV 2015 in  [place] when he had flu like symptoms and knew he had acute HIV and went and got tested. Nadir Cd4 was > 700  He saw Dr.PAul Tamala Julian and was started on isentress and truvada. He later changed to [facility]. Since end of 2017 he was off treatment because of lack of insurance. He moved to [facility] in  2018 and did not engage in care because of lack on insurance until  Jan 2020  Nadir Cd4 484 VL >200,000 OI none HAARt history truvada isentress Genvoya Biktarvy Acquired thru-sex with men Genotype done in [place] ? Past Medical History:  Diagnosis Date   Anxiety    Depression    Heart murmur    HIV (human immunodeficiency virus infection) ([place])    HLD (hyperlipidemia)    Stroke [facility])     Surgical History -none  FH Father- diabetes, HTN Mother-adrenal tumor removed  ?SH Non smoker No illicit drug use Occasional alcohol Has traveled to [place] in 2013 Lives with his parents  ? Current Outpatient Medications  Medication Sig Dispense Refill   atorvastatin (LIPITOR) 20 MG tablet Take 20 mg by mouth daily.     dolutegravir-lamiVUDine (DOVATO) 50-300 MG tablet Take 1 tablet by mouth daily. 30 tablet 1   hydrOXYzine (ATARAX) 25 MG tablet Take 0.5-1 tablets (12.5-25 mg total) by mouth 2 (two) times daily as needed. For severe anxiety only 60 tablet 1   ketoconazole (NIZORAL) 2 % shampoo Apply 1 application topically 2 (two) times a week.     lamoTRIgine (LAMICTAL) 150 MG tablet TAKE 1 TABLET(150 MG) BY MOUTH TWICE DAILY 180 tablet 0   zolpidem (AMBIEN CR) 12.5 MG CR tablet TAKE 1 TABLET(12.5 MG) BY MOUTH AT BEDTIME AS NEEDED FOR SLEEP 30 tablet 2   No current facility-administered medications for this  visit.    REVIEW OF SYSTEMS:  Const: negative fever, negative chills, negative weight loss Eyes: negative diplopia or visual changes, negative eye pain ENT: negative coryza, negative sore throat Resp: negative cough, hemoptysis, dyspnea Cards: negative for chest pain, palpitations, lower extremity edema GU: negative for frequency, dysuria and hematuria Skin: negative for rash and pruritus Heme: negative for easy bruising and gum/nose bleeding MS: negative for myalgias, arthralgias, back pain and muscle weakness Neurolo:negative for headaches, dizziness, vertigo, memory problems     Objective:  VITALS:  BP 127/84   Pulse 66   Temp 98.1 F (36.7 C) (Temporal)   Wt 171 lb (77.6 kg)   BMI 27.60 kg/m  PHYSICAL EXAM:  General: Looks well  head: Normocephalic, without obvious abnormality, atraumatic. Eyes: Conjunctivae clear, anicteric sclerae. Pupils are equal Nose: Nares normal. No drainage or sinus tenderness. Throat: Lips, mucosa, and tongue normal. No Thrush Lungs: Clear to auscultation bilaterally. No Wheezing or Rhonchi. No rales. Heart: Regular rate and rhythm, no murmur, rub or gallop. Abdomen: Soft, non-tender,not distended. Bowel sounds normal. No masses Extremities: Extremities normal, atraumatic, no cyanosis. No edema. No clubbing Skin: No rashes or lesions. Not Jaundiced Lymph: Cervical, supraclavicular normal. Neurologic: Grossly non-focal Pertinent Labs  IMAGING RESULTS: Health maintenance Vaccination  Vaccine Date last given comment  Influenza 01/18/21   Hepatitis B 05/15/2014,2/29 &11/24/14 [place]  Hepatitis A 05/15/14 &06/15/14   Prevnar-PCV-13 04/02/2014   Pneumovac-PPSV-23 09/10/18   TdaP 05/15/14   HPV 09/23/19, 10/21/19 and 03/18/20   Shingrix ( zoster vaccine)    Corona virus vaccine X 3 08/05/19, 5/21, 02/06/20, 01/18/21   Monkey pox- aug.sept  ______________________  Labs Lab Result  Date comment  HIV VL 30 6/22   CD4 84531%) 03/18/20   Genotype Negative 04/09/2014 [facility] [place]  [id number] NEG 04/02/2014   HIV antibody Reactive 05/07/18   RPR NR 05/07/18  03/24/21 False positive  Quantiferon Gold NR 03/24/21   Hep C ab NR 05/07/18   Hepatitis B-ab,ag,c Ab-positive 05/07/18 vaccinated  Hepatitis A-IgM, IgG /T     Lipid 159/46/91 and TGL 122 09/22/21   GC/CHL NR 09/23/19   PAP LGSIL 09/23/19 Will repeat in 6 months  HB,PLT,Cr, LFT 14/284/1.34/N      Preventive  Procedure Result  Date comment  colonoscopy     ANAL pap     Dental exam     Opthal       Impression/Recommendation ? HIV- on Dovato- doing well- 100% adherent .Will  check CD4 today and BMP Last viral load was less than 20    In a stable relationship with a male partner who is Neg and knows his status He is traveling to [place] next week.  We will do Doxy PREP To prevent STDs  TIA like episode-once  investigated and found to have borderline high Anticardiolipin IgM and homocysteine- on aspirin and methyl folate Followed by heme onc who does not think the labs are significant It was thought to be due to his medication ( antidepressant/  Bipolar  disorder- followed by Psychiatrist- on lamictal $RemoveBef'250mg'jexruSFpiz$  QD,  ambien PRN q Hs  Hyperlipidemia- on atorvastatin Follow-up 4 months ___________________________________________________ Discussed with patient in detail.

## 2021-11-04 ENCOUNTER — Encounter: Payer: Self-pay | Admitting: Infectious Diseases

## 2021-11-04 LAB — T-HELPER CELLS CD4/CD8 %
% CD 4 Pos. Lymph.: 28.7 % — ABNORMAL LOW (ref 30.8–58.5)
Absolute CD 4 Helper: 775 /uL (ref 359–1519)
Basophils Absolute: 0.1 10*3/uL (ref 0.0–0.2)
Basos: 1 %
CD3+CD4+ Cells/CD3+CD8+ Cells Bld: 0.6 — ABNORMAL LOW (ref 0.92–3.72)
CD3+CD8+ Cells # Bld: 1288 /uL — ABNORMAL HIGH (ref 109–897)
CD3+CD8+ Cells NFr Bld: 47.7 % — ABNORMAL HIGH (ref 12.0–35.5)
EOS (ABSOLUTE): 0.1 10*3/uL (ref 0.0–0.4)
Eos: 2 %
Hematocrit: 42.4 % (ref 37.5–51.0)
Hemoglobin: 14.5 g/dL (ref 13.0–17.7)
Immature Grans (Abs): 0 10*3/uL (ref 0.0–0.1)
Immature Granulocytes: 0 %
Lymphocytes Absolute: 2.7 10*3/uL (ref 0.7–3.1)
Lymphs: 48 %
MCH: 30 pg (ref 26.6–33.0)
MCHC: 34.2 g/dL (ref 31.5–35.7)
MCV: 88 fL (ref 79–97)
Monocytes Absolute: 0.4 10*3/uL (ref 0.1–0.9)
Monocytes: 7 %
Neutrophils Absolute: 2.4 10*3/uL (ref 1.4–7.0)
Neutrophils: 42 %
Platelets: 322 10*3/uL (ref 150–450)
RBC: 4.84 x10E6/uL (ref 4.14–5.80)
RDW: 11.8 % (ref 11.6–15.4)
WBC: 5.6 10*3/uL (ref 3.4–10.8)

## 2021-11-07 MED ORDER — ATORVASTATIN CALCIUM 20 MG PO TABS
20.0000 mg | ORAL_TABLET | Freq: Every day | ORAL | 0 refills | Status: DC
Start: 2021-11-07 — End: 2021-12-15

## 2021-12-15 ENCOUNTER — Other Ambulatory Visit: Payer: Self-pay

## 2021-12-15 MED ORDER — ATORVASTATIN CALCIUM 20 MG PO TABS: 20.0000 mg | ORAL_TABLET | Freq: Every day | ORAL | 3 refills | Status: DC

## 2021-12-15 MED ORDER — DOVATO 50-300 MG PO TABS: 1.0000 | ORAL_TABLET | Freq: Every day | ORAL | 2 refills | Status: DC

## 2022-01-19 ENCOUNTER — Other Ambulatory Visit: Payer: Self-pay | Admitting: Psychiatry

## 2022-01-19 DIAGNOSIS — F3181 Bipolar II disorder: Secondary | ICD-10-CM

## 2022-02-03 ENCOUNTER — Encounter: Payer: Self-pay | Admitting: Registered Nurse

## 2022-02-03 ENCOUNTER — Telehealth: Payer: Self-pay | Admitting: Registered Nurse

## 2022-02-03 DIAGNOSIS — R7989 Other specified abnormal findings of blood chemistry: Secondary | ICD-10-CM

## 2022-02-03 DIAGNOSIS — E7800 Pure hypercholesterolemia, unspecified: Secondary | ICD-10-CM

## 2022-02-04 ENCOUNTER — Other Ambulatory Visit: Payer: Self-pay | Admitting: Psychiatry

## 2022-02-04 DIAGNOSIS — F5105 Insomnia due to other mental disorder: Secondary | ICD-10-CM

## 2022-02-14 ENCOUNTER — Ambulatory Visit (INDEPENDENT_AMBULATORY_CARE_PROVIDER_SITE_OTHER): Payer: No Typology Code available for payment source | Admitting: Psychiatry

## 2022-02-14 ENCOUNTER — Encounter: Payer: Self-pay | Admitting: Psychiatry

## 2022-02-14 VITALS — BP 119/81 | HR 69 | Temp 98.7°F | Ht 66.0 in | Wt 172.6 lb

## 2022-02-14 DIAGNOSIS — F5105 Insomnia due to other mental disorder: Secondary | ICD-10-CM | POA: Diagnosis not present

## 2022-02-14 DIAGNOSIS — F411 Generalized anxiety disorder: Secondary | ICD-10-CM

## 2022-02-14 DIAGNOSIS — F3181 Bipolar II disorder: Secondary | ICD-10-CM | POA: Diagnosis not present

## 2022-02-14 NOTE — Progress Notes (Unsigned)
BH MD OP Progress Note  02/14/2022 4:55 PM Darrell Kennedy  MRN:  [id number]  Chief Complaint:  Chief Complaint  Patient presents with   Follow-up   Anxiety   Depression   Medication Refill   HPI: Darrell Kennedy is a 41 year old Caucasian male, lives in [place], has a history of bipolar disorder, insomnia, hyperlipidemia, HIV positive was evaluated in office today.  Patient presented late for the appointment.  Patient today reports he is currently doing well on the current medication regimen.  Denies any manic or hypomanic symptoms.  Denies any depressive symptoms.  Patient denies any sleep problems as long as he takes the Ambien.  However would like to get off of the Ambien if possible.  Since he is worried about its habit-forming potential.  Has tried several other medications in the past however nothing really worked for him except for the Seroquel which she does not want to go back, due to long-term epworth side effects.  Patient denies any suicidality, homicidality or perceptual disturbances.  Patient denies any other concerns today.  Visit Diagnosis:    ICD-10-CM   1. Bipolar II disorder, moderate, hypomanic, with mixed features, in full remission (HCC)  F31.81     2. Generalized anxiety disorder  F41.1     3. Insomnia due to mental condition  F51.05    mood    4. Attention and concentration deficit  R41.840       Past Psychiatric History: Reviewed past psychiatric history from progress note on 02/06/2019.  Patient completed neuropsychological testing-07/26/2021-Dr. Rodenbough-did not meet criteria for ADHD. Past trials of medications like BuSpar, Seroquel, trazodone.  Past Medical History:  Past Medical History:  Diagnosis Date   Anxiety    Depression    Heart murmur    HIV (human immunodeficiency virus infection) (HCC)    HLD (hyperlipidemia)    Stroke [facility])     Past Surgical History:  Procedure Laterality Date   KNEE ARTHROSCOPY WITH ANTERIOR  CRUCIATE LIGAMENT (ACL) REPAIR WITH HAMSTRING GRAFT Left 05/23/2021   Procedure: Left arthroscopic ACL reconstruction using quadriceps tendon autograft, lateral meniscus repair, chondroplasty of the medial femoral condyle;  Surgeon: Signa Kell, MD;  Location: [facility] ORS;  Service: Orthopedics;  Laterality: Left;   MOUTH SURGERY  2021   [facility]   TEE WITHOUT CARDIOVERSION N/A 03/05/2019   Procedure: TRANSESOPHAGEAL ECHOCARDIOGRAM (TEE);  Surgeon: Lamar Blinks, MD;  Location: [facility] ORS;  Service: Cardiovascular;  Laterality: N/A;    Family Psychiatric History: Reviewed family psychiatric history from progress note on 02/06/2019.  Family History:  Family History  Problem Relation Age of Onset   Hypertension Father    Diabetes Father    Kidney disease Father    Depression Father    Hyperlipidemia Father    Alcohol abuse Brother    Heart disease Paternal Grandmother    Drug abuse Cousin     Social History: Reviewed social history from progress note on 02/06/2019. Social History   Socioeconomic History   Marital status: Single    Spouse name: Not on file   Number of children: 0   Years of education: Not on file   Highest education level: Master's degree (e.g., MA, MS, MEng, MEd, MSW, MBA)  Occupational History   Occupation: student  Tobacco Use   Smoking status: Never   Smokeless tobacco: Never   Tobacco comments:    None  Vaping Use   Vaping Use: Never used  Substance and Sexual Activity  Alcohol use: Yes    Alcohol/week: 0.0 standard drinks of alcohol    Comment: social maybe once a week   Drug use: Never   Sexual activity: Yes    Birth control/protection: Condom  Other Topics Concern   Not on file  Social History Narrative   Not on file   Social Determinants of Health   Financial Resource Strain: Low Risk  (03/30/2020)   Overall Financial Resource Strain (CARDIA)    Difficulty of Paying Living Expenses: Not hard at all  Food Insecurity: No Food  Insecurity (03/30/2020)   Hunger Vital Sign    Worried About Running Out of Food in the Last Year: Never true    Ran Out of Food in the Last Year: Never true  Transportation Needs: No Transportation Needs (03/30/2020)   PRAPARE - Administrator, Civil Service (Medical): No    Lack of Transportation (Non-Medical): No  Physical Activity: Sufficiently Active (03/30/2020)   Exercise Vital Sign    Days of Exercise per Week: 5 days    Minutes of Exercise per Session: 50 min  Stress: No Stress Concern Present (03/30/2020)   [employer] of Occupational Health - Occupational Stress Questionnaire    Feeling of Stress : Not at all  Social Connections: Socially Isolated (03/30/2020)   Social Connection and Isolation Panel [NHANES]    Frequency of Communication with Friends and Family: More than three times a week    Frequency of Social Gatherings with Friends and Family: Once a week    Attends Religious Services: Never    Database administrator or Organizations: No    Attends Banker Meetings: Never    Marital Status: Never married    Allergies:  Allergies  Allergen Reactions   Amoxicillin Rash    Metabolic Disorder Labs: Lab Results  Component Value Date   HGBA1C 5.3 09/22/2021   MPG 114.02 02/12/2019   No results found for: "PROLACTIN" Lab Results  Component Value Date   CHOL 159 09/22/2021   TRIG 122 09/22/2021   HDL 46 09/22/2021   CHOLHDL 3.5 09/22/2021   VLDL 14 09/23/2019   LDLCALC 91 09/22/2021   LDLCALC 168 (H) 11/05/2020   Lab Results  Component Value Date   TSH 1.240 09/22/2021   TSH 0.965 11/05/2020    Therapeutic Level Labs: No results found for: "LITHIUM" No results found for: "VALPROATE" No results found for: "CBMZ"  Current Medications: Current Outpatient Medications  Medication Sig Dispense Refill   atorvastatin (LIPITOR) 20 MG tablet Take 1 tablet (20 mg total) by mouth daily. 30 tablet 3   dolutegravir-lamiVUDine  (DOVATO) 50-300 MG tablet Take 1 tablet by mouth daily. 30 tablet 2   hydrOXYzine (ATARAX) 25 MG tablet Take 0.5-1 tablets (12.5-25 mg total) by mouth 2 (two) times daily as needed. For severe anxiety only 60 tablet 1   ketoconazole (NIZORAL) 2 % shampoo Apply 1 application topically 2 (two) times a week.     lamoTRIgine (LAMICTAL) 150 MG tablet TAKE 1 TABLET(150 MG) BY MOUTH TWICE DAILY 180 tablet 0   zolpidem (AMBIEN CR) 12.5 MG CR tablet TAKE 1 TABLET(12.5 MG) BY MOUTH AT BEDTIME AS NEEDED FOR SLEEP 30 tablet 2   doxycycline (ADOXA) 100 MG tablet Take 2 tablets (200 mg total) by mouth daily. (Patient not taking: Reported on 02/14/2022) 6 tablet 0   No current facility-administered medications for this visit.     Musculoskeletal: Strength & Muscle Tone: within normal limits Gait &  Station: normal Patient leans: N/A  Psychiatric Specialty Exam: Review of Systems  Psychiatric/Behavioral: Negative.    All other systems reviewed and are negative.   Blood pressure 119/81, pulse 69, temperature 98.7 F (37.1 C), temperature source Oral, height 5\' 6"  (1.676 m), weight 172 lb 9.6 oz (78.3 kg).Body mass index is 27.86 kg/m.  General Appearance: Casual  Eye Contact:  Fair  Speech:  Clear and Coherent  Volume:  Normal  Mood:  Euthymic  Affect:  Congruent  Thought Process:  Goal Directed and Descriptions of Associations: Intact  Orientation:  Full (Time, Place, and Person)  Thought Content: Logical   Suicidal Thoughts:  No  Homicidal Thoughts:  No  Memory:  Immediate;   Fair Recent;   Fair Remote;   Fair  Judgement:  Fair  Insight:  Fair  Psychomotor Activity:  Normal  Concentration:  Concentration: Fair and Attention Span: Fair  Recall:  of Knowledge: Fair  Language: Fair  Akathisia:  No  Handed:  Right  AIMS (if indicated): not done  Assets:  Communication Skills Desire for Improvement Housing Social Support  ADL's:  Intact  Cognition: WNL  Sleep:  Fair    Screenings: AUDIT    Flowsheet Row Office Visit from 10/07/2018 in [facility]  Alcohol Use Disorder Identification Test Final Score (AUDIT) 3      GAD-7    Flowsheet Row Office Visit from 02/14/2022 in [facility] Office Visit from 11/02/2021 in [facility] Office Visit from 06/29/2021 in [facility] Video Visit from 03/31/2021 in [facility] Video Visit from 09/15/2020 in [facility]  Total GAD-7 Score 0 0 12 0 2      PHQ2-9    Flowsheet Row Office Visit from 02/14/2022 in [facility] Office Visit from 11/03/2021 in [facility] Office Visit from 11/02/2021 in [facility] Office Visit from 10/06/2021 in [facility] Office Visit from 06/29/2021 in [facility]  PHQ-2 Total Score 0 0 0 0 2  PHQ-9 Total Score 0 -- -- -- 10      Flowsheet Row [facility] Office Visit from 06/29/2021 in [facility] Admission (Discharged) from 05/23/2021 in [facility]  C-SSRS RISK CATEGORY No Risk No Risk No Risk        Assessment and Plan: Darrell Kennedy is a 41 year old Caucasian male, employed, lives in [place], has a history of bipolar disorder, hyperlipidemia, HIV positive was evaluated in office today.  Patient is currently stable however would like to get off of the Ambien.  Plan as noted below.  Plan Bipolar disorder type II most recent episode hypomanic mixed in remission Lamotrigine 300 mg p.o. daily in divided dosage.  GAD-stable Hydroxyzine 12.5-25 mg p.o. twice daily as needed Continue CBT as needed -Mr. [birth year]1984  Insomnia-stable Ambien CR 12.5 mg p.o. nightly as  needed Patient to try over-the-counter medications like Reaxium , Sleep 3 , could take it along with the Ambien and then start skipping Ambien gradually.   Follow-up in clinic in 3 months or sooner if needed.    This note was generated in part or whole with voice recognition software. Voice recognition is usually quite accurate but there are transcription errors that can and very often do occur. I apologize for any typographical errors that were not detected  and corrected.    Ursula Alert, MD 02/14/2022, 4:55 PM

## 2022-02-15 NOTE — Telephone Encounter (Signed)
Patient seen in workcenter reminded nonfasting labs to be scheduled.  Patient thought fasting required and had not figured out a good day.  Discussed this was for liver and kidney function at this time no fasting ensure he hydrates.  Patient reported knee is feeling much better running/plyometrics good and activities almost back to 100% baseline per patient.  He no longer feels like knee had surgery.  Patient verbalized understanding information/instructions and had no further questions at this time.

## 2022-03-06 ENCOUNTER — Other Ambulatory Visit: Payer: Self-pay

## 2022-03-06 MED ORDER — DOVATO 50-300 MG PO TABS: 1.0000 | ORAL_TABLET | Freq: Every day | ORAL | 0 refills | Status: DC

## 2022-03-07 ENCOUNTER — Ambulatory Visit: Payer: No Typology Code available for payment source | Admitting: Infectious Diseases

## 2022-03-23 ENCOUNTER — Encounter: Payer: Self-pay | Admitting: Infectious Diseases

## 2022-03-23 ENCOUNTER — Ambulatory Visit: Payer: No Typology Code available for payment source | Attending: Infectious Diseases | Admitting: Infectious Diseases

## 2022-03-23 ENCOUNTER — Other Ambulatory Visit
Admission: RE | Admit: 2022-03-23 | Discharge: 2022-03-23 | Disposition: A | Discharge status: Home / Self Care | Payer: No Typology Code available for payment source | Source: Ambulatory Visit | Attending: Infectious Diseases | Admitting: Infectious Diseases

## 2022-03-23 VITALS — Temp 97.3°F | Ht 66.0 in | Wt 174.0 lb

## 2022-03-23 DIAGNOSIS — R768 Other specified abnormal immunological findings in serum: Secondary | ICD-10-CM | POA: Diagnosis not present

## 2022-03-23 DIAGNOSIS — Z113 Encounter for screening for infections with a predominantly sexual mode of transmission: Secondary | ICD-10-CM | POA: Insufficient documentation

## 2022-03-23 DIAGNOSIS — D6861 Antiphospholipid syndrome: Secondary | ICD-10-CM | POA: Diagnosis not present

## 2022-03-23 DIAGNOSIS — Z79899 Other long term (current) drug therapy: Secondary | ICD-10-CM | POA: Insufficient documentation

## 2022-03-23 DIAGNOSIS — Z7982 Long term (current) use of aspirin: Secondary | ICD-10-CM | POA: Insufficient documentation

## 2022-03-23 DIAGNOSIS — F319 Bipolar disorder, unspecified: Secondary | ICD-10-CM | POA: Insufficient documentation

## 2022-03-23 DIAGNOSIS — B20 Human immunodeficiency virus [HIV] disease: Secondary | ICD-10-CM | POA: Diagnosis present

## 2022-03-23 DIAGNOSIS — E785 Hyperlipidemia, unspecified: Secondary | ICD-10-CM | POA: Diagnosis not present

## 2022-03-23 DIAGNOSIS — Z79624 Long term (current) use of inhibitors of nucleotide synthesis: Secondary | ICD-10-CM | POA: Diagnosis not present

## 2022-03-23 LAB — COMPREHENSIVE METABOLIC PANEL
ALT: 17 U/L (ref 0–44)
AST: 19 U/L (ref 15–41)
Albumin: 4.7 g/dL (ref 3.5–5.0)
Alkaline Phosphatase: 60 U/L (ref 38–126)
Anion gap: 7 (ref 5–15)
BUN: 17 mg/dL (ref 6–20)
CO2: 29 mmol/L (ref 22–32)
Calcium: 9.8 mg/dL (ref 8.9–10.3)
Chloride: 103 mmol/L (ref 98–111)
Creatinine, Ser: 1.35 mg/dL — ABNORMAL HIGH (ref 0.61–1.24)
GFR, Estimated: >60 mL/min (ref >60)
Glucose, Bld: 105 mg/dL — ABNORMAL HIGH (ref 70–99)
Potassium: 4 mmol/L (ref 3.5–5.1)
Sodium: 139 mmol/L (ref 135–145)
Total Bilirubin: 0.7 mg/dL (ref 0.3–1.2)
Total Protein: 7.9 g/dL (ref 6.5–8.1)

## 2022-03-23 LAB — CBC WITH DIFFERENTIAL/PLATELET
Abs Immature Granulocytes: 0.01 10*3/uL (ref 0.00–0.07)
Basophils Absolute: 0.1 10*3/uL (ref 0.0–0.1)
Basophils Relative: 1 %
Eosinophils Absolute: 0.2 10*3/uL (ref 0.0–0.5)
Eosinophils Relative: 4 %
HCT: 44.6 % (ref 39.0–52.0)
Hemoglobin: 14.9 g/dL (ref 13.0–17.0)
Immature Granulocytes: 0 %
Lymphocytes Relative: 42 %
Lymphs Abs: 2.8 10*3/uL (ref 0.7–4.0)
MCH: 31.2 pg (ref 26.0–34.0)
MCHC: 33.4 g/dL (ref 30.0–36.0)
MCV: 93.3 fL (ref 80.0–100.0)
Monocytes Absolute: 0.4 10*3/uL (ref 0.1–1.0)
Monocytes Relative: 6 %
Neutro Abs: 3.1 10*3/uL (ref 1.7–7.7)
Neutrophils Relative %: 47 %
Platelets: 305 10*3/uL (ref 150–400)
RBC: 4.78 MIL/uL (ref 4.22–5.81)
RDW: 11.2 % — ABNORMAL LOW (ref 11.5–15.5)
WBC: 6.6 10*3/uL (ref 4.0–10.5)
nRBC: 0 % (ref 0.0–0.2)

## 2022-03-23 LAB — CHLAMYDIA/NGC RT PCR (ARMC ONLY)
Chlamydia Tr: NOT DETECTED
Chlamydia Tr: NOT DETECTED
Chlamydia Tr: NOT DETECTED
N gonorrhoeae: NOT DETECTED
N gonorrhoeae: NOT DETECTED
N gonorrhoeae: NOT DETECTED

## 2022-03-23 MED ORDER — DOVATO 50-300 MG PO TABS: 1.0000 | ORAL_TABLET | Freq: Every day | ORAL | 6 refills | Status: DC

## 2022-03-23 NOTE — Progress Notes (Signed)
NAME: Darrell Kennedy  DOB: 04/29/1980  MRN: [id number]  Date/Time: 03/23/2022 9:13 AM  Subjective:  Follow up HIV visit ? Darrell Kennedy is a 41 y.o. male with a history of HIV He is here for follow-up after on Dovato 100% adherent- no new health issues since last visit   Followed by psychiatrist Dr.Eppen for Bipolar disorder which is well controlled  05/23/21: Repair of Left knee anterior cruciate ligament tear Left lateral meniscus tear   02/12/19   with TIA like episode when he felt the left arm and leg tingling preceded by palpitation. Was transient. underwent work up with Neg MRI, ECHO, TEE which did not show any intracardiac shunting. He saw neurologist Dr.Shah at the [facility] clinic on 02/17/19  and he sent hypercoagulable panel ESR, CRP - Factor V Leiden mutation - Antithrombin III functional assay - MTHFR gene mutation - Protein C and S activity (functional assay) - Homocysteine - Lupus anticoagulant comprehensive - Antiphospholipid syndrome (includes aPTT, PT, INR, Thrombin time, Dilute Viper Venom Time, Hexagonal phase phospholipid, Anticoardiolipin antibody IgG and IgM, Beta - 2 Glycoprotein IgG and IgM,   Anticardiolipin IgM  ab was elevated at 13 ( 0-12 ) N and homocysteine was 16( 0-14.5) Started on methylfolate  and aspirin . seen heme once Dr.Finnegan .    HIV history HIV diagnosed NOV 2015 in  [place] when he had flu like symptoms and knew he had acute HIV and went and got tested. Nadir Cd4 was > 700  He saw Dr.PAul Tamala Julian and was started on isentress and truvada. He later changed to [facility]. Since end of 2017 he was off treatment because of lack of insurance. He moved to [facility] in 2018 and did not engage in care because of lack on insurance until  Jan 2020  Nadir Cd4 484 VL >200,000 OI none HAARt history truvada isentress Genvoya Biktarvy Acquired thru-sex with men Genotype done in [place] ? Past Medical History:  Diagnosis Date   Anxiety    Depression    Heart  murmur    HIV (human immunodeficiency virus infection) ([place])    HLD (hyperlipidemia)    Stroke [facility])     Surgical History -none  FH Father- diabetes, HTN Mother-adrenal tumor removed  ?SH Non smoker No illicit drug use Occasional alcohol Has traveled to [place] in 2013 Lives with his parents One steady partner- monogamous relationship Partner knows his status ? Current Outpatient Medications  Medication Sig Dispense Refill   atorvastatin (LIPITOR) 20 MG tablet Take 1 tablet (20 mg total) by mouth daily. 30 tablet 3   dolutegravir-lamiVUDine (DOVATO) 50-300 MG tablet Take 1 tablet by mouth daily. 30 tablet 0   hydrOXYzine (ATARAX) 25 MG tablet Take 0.5-1 tablets (12.5-25 mg total) by mouth 2 (two) times daily as needed. For severe anxiety only 60 tablet 1   ketoconazole (NIZORAL) 2 % shampoo Apply 1 application topically 2 (two) times a week.     lamoTRIgine (LAMICTAL) 150 MG tablet TAKE 1 TABLET(150 MG) BY MOUTH TWICE DAILY 180 tablet 0   prednisoLONE acetate (PRED FORTE) 1 % ophthalmic suspension Place 1 drop into the right eye 2 (two) times daily.     zolpidem (AMBIEN CR) 12.5 MG CR tablet TAKE 1 TABLET(12.5 MG) BY MOUTH AT BEDTIME AS NEEDED FOR SLEEP 30 tablet 2   No current facility-administered medications for this visit.    REVIEW OF SYSTEMS:  Const: negative fever, negative chills, negative weight loss Eyes: negative diplopia or visual changes, negative eye  pain ENT: negative coryza, negative sore throat Resp: negative cough, hemoptysis, dyspnea Cards: negative for chest pain, palpitations, lower extremity edema GU: negative for frequency, dysuria and hematuria Skin: negative for rash and pruritus Heme: negative for easy bruising and gum/nose bleeding MS: negative for myalgias, arthralgias, back pain and muscle weakness Neurolo:negative for headaches, dizziness, vertigo, memory problems    Objective:  VITALS:  Temp (!) 97.3 F (36.3 C) (Temporal)   Ht _0   (1.676 m)   Wt 174 lb (78.9 kg)   BMI 28.08 kg/m  PHYSICAL EXAM:  General: Looks well  head: Normocephalic, without obvious abnormality, atraumatic. Eyes: Conjunctivae clear, anicteric sclerae. Pupils are equal Nose: Nares normal. No drainage or sinus tenderness. Throat: Lips, mucosa, and tongue normal. No Thrush Lungs: Clear to auscultation bilaterally. No Wheezing or Rhonchi. No rales. Heart: Regular rate and rhythm, no murmur, rub or gallop. Abdomen: Soft, non-tender,not distended. Bowel sounds normal. No masses Extremities: Extremities normal, atraumatic, no cyanosis. No edema. No clubbing Skin: No rashes or lesions. Not Jaundiced Lymph: Cervical, supraclavicular normal. Neurologic: Grossly non-focal Pertinent Labs  IMAGING RESULTS: Health maintenance Vaccination  Vaccine Date last given comment  Influenza Oct 2023   Hepatitis B 05/15/2014,2/29 &11/24/14 [place]  Hepatitis A 05/15/14 &06/15/14   Prevnar-PCV-13 04/02/2014   Pneumovac-PPSV-23 09/10/18   TdaP 05/15/14   HPV 09/23/19, 10/21/19 and 03/18/20   Shingrix ( zoster vaccine)    Corona virus vaccine X 3 08/05/19, 5/21, 02/06/20, 01/18/21   Monkey pox- aug.sept  ______________________  Labs Lab Result  Date comment  HIV VL <20 6/22   CD4 775 (28.7%) 11/03/21   Genotype Negative 04/09/2014 [facility] [place]  [id number] 04/02/2014   HIV antibody Reactive 05/07/18   RPR NR 03/24/21   Quantiferon Gold NR 03/24/21   Hep C ab NR 05/07/18   Hepatitis B-ab,ag,c Ab-positive 05/07/18 vaccinated  Hepatitis A-IgM, IgG /T     Lipid 159/46/91 and TGL 122 09/22/21   GC/CHL NR 09/23/19   PAP LGSIL 09/23/19 Will repeat in 6 months  HB,PLT,Cr, LFT 14/284/1.34/N      Preventive  Procedure Result  Date comment  colonoscopy     ANAL pap Benign reactive 10/07/21   Dental exam     Opthal       Impression/Recommendation ? HIV- on Dovato- doing well- 100% adherent .labs today  In a stable relationship with a male partner who is Neg and knows his  status  TH/o IA like episode-once  investigated and found to have borderline high Anticardiolipin IgM and homocysteine- on aspirin and methyl folate Followed by heme onc who does not think the labs are significant It was thought to be due to his medication ( antidepressant/  Bipolar  disorder- followed by Psychiatrist- on lamictal 259m QD,  ambien PRN q Hs  Hyperlipidemia- on atorvastatin Follow-up 4 months   __Anal pap last done on 10/06/21 NR _________________________________________________ Discussed with patient in detail. Follow up 6 months Pap then

## 2022-03-23 NOTE — Patient Instructions (Addendum)
Today you are here for follow up of HIV- we will do labs- Follow up 6 months

## 2022-03-24 ENCOUNTER — Encounter: Payer: Self-pay | Admitting: Infectious Diseases

## 2022-03-24 LAB — RPR: RPR Ser Ql: NONREACTIVE

## 2022-03-24 LAB — T-HELPER CELLS CD4/CD8 %
% CD 4 Pos. Lymph.: 29.4 % — ABNORMAL LOW (ref 30.8–58.5)
Absolute CD 4 Helper: 794 /uL (ref 359–1519)
Basophils Absolute: 0.1 10*3/uL (ref 0.0–0.2)
Basos: 1 %
CD3+CD4+ Cells/CD3+CD8+ Cells Bld: 0.63 — ABNORMAL LOW (ref 0.92–3.72)
CD3+CD8+ Cells # Bld: 1269 /uL — ABNORMAL HIGH (ref 109–897)
CD3+CD8+ Cells NFr Bld: 47 % — ABNORMAL HIGH (ref 12.0–35.5)
EOS (ABSOLUTE): 0.2 10*3/uL (ref 0.0–0.4)
Eos: 4 %
Hematocrit: 44.5 % (ref 37.5–51.0)
Hemoglobin: 15.1 g/dL (ref 13.0–17.7)
Immature Grans (Abs): 0 10*3/uL (ref 0.0–0.1)
Immature Granulocytes: 0 %
Lymphocytes Absolute: 2.7 10*3/uL (ref 0.7–3.1)
Lymphs: 42 %
MCH: 31.4 pg (ref 26.6–33.0)
MCHC: 33.9 g/dL (ref 31.5–35.7)
MCV: 93 fL (ref 79–97)
Monocytes Absolute: 0.4 10*3/uL (ref 0.1–0.9)
Monocytes: 7 %
Neutrophils Absolute: 2.9 10*3/uL (ref 1.4–7.0)
Neutrophils: 46 %
Platelets: 309 10*3/uL (ref 150–450)
RBC: 4.81 x10E6/uL (ref 4.14–5.80)
RDW: 11.6 % (ref 11.6–15.4)
WBC: 6.4 10*3/uL (ref 3.4–10.8)

## 2022-03-24 LAB — HSV(HERPES SIMPLEX VRS) I + II AB-IGG
HSV 1 Glycoprotein G Ab, IgG: 39.6 index — ABNORMAL HIGH (ref 0.00–0.90)
HSV 2 Glycoprotein G Ab, IgG: 4.34 index — ABNORMAL HIGH (ref 0.00–0.90)

## 2022-03-24 LAB — HIV-1 RNA QUANT-NO REFLEX-BLD
HIV 1 RNA Quant: <20 copies/mL
LOG10 HIV-1 RNA: UNDETERMINED log10copy/mL

## 2022-03-24 MED ORDER — VALACYCLOVIR HCL 1 G PO TABS: 1000.0000 mg | ORAL_TABLET | Freq: Two times a day (BID) | ORAL | 0 refills | Status: DC

## 2022-03-24 NOTE — Telephone Encounter (Signed)
Patient had labs at [facility] office creatinine improved and LFTs normal   Latest Reference Range & Units 11/03/21 13:02 03/23/22 10:02  BASIC METABOLIC PANEL  Rpt !   COMPREHENSIVE METABOLIC PANEL   Rpt !  Sodium 135 - 145 mmol/L 140 139  Potassium 3.5 - 5.1 mmol/L 4.0 4.0  Chloride 98 - 111 mmol/L 104 103  CO2 22 - 32 mmol/L 29 29  Glucose 70 - 99 mg/dL 875 (H) 643 (H)  BUN 6 - 20 mg/dL 16 17  Creatinine 3.29 - 1.24 mg/dL 5.18 (H) 8.41 (H)  Calcium 8.9 - 10.3 mg/dL 66.0 9.8  Anion gap 5 - 15  7 7   Alkaline Phosphatase 38 - 126 U/L  60  Albumin 3.5 - 5.0 g/dL  4.7  AST 15 - 41 U/L  19  ALT 0 - 44 U/L  17  Total Protein 6.5 - 8.1 g/dL  7.9  Total Bilirubin 0.3 - 1.2 mg/dL  0.7  GFR, Estimated mL/min 58 (L) >60  WBC 3.4 - 10.8 x10E3/uL 4.0 - 10.5 K/uL 5.6 6.4 6.6  RBC 4.14 - 5.80 x10E6/uL 4.22 - 5.81 MIL/uL 4.84 4.81 4.78  Hemoglobin 13.0 - 17.7 g/dL >63 - 01.6 g/dL 01.0 93.2 35.5  HCT 73.2 - 51.0 % 39.0 - 52.0 % 42.4 44.5 44.6  MCV 79 - 97 fL 80.0 - 100.0 fL 88 93 93.3  MCH 26.6 - 33.0 pg 26.0 - 34.0 pg 30.0 31.4 31.2  MCHC 31.5 - 35.7 g/dL 20.2 - 54.2 g/dL 70.6 23.7 62.8  RDW 31.5 - 15.4 % 11.5 - 15.5 % 11.8 11.6 11.2 (L)  Platelets 150 - 450 x10E3/uL 150 - 400 K/uL 322 309 305  nRBC 0.0 - 0.2 %  0.0  Neutrophils Not Estab. % % 42 46 47  Lymphocytes %  42  Monocytes Relative %  6  Eosinophil %  4  Basophil %  1  Immature Granulocytes Not Estab. % % 0 0 0  NEUT# 1.4 - 7.0 x10E3/uL 1.7 - 7.7 K/uL 2.4 2.9 3.1  Lymphocyte # 0.7 - 3.1 x10E3/uL 0.7 - 4.0 K/uL 2.7 2.7 2.8  Monocyte # 0.1 - 1.0 K/uL  0.4  Monocytes Absolute 0.1 - 0.9 x10E3/uL 0.4 0.4  Eosinophils Absolute 0.0 - 0.5 K/uL  0.2  Basophils Absolute 0.0 - 0.2 x10E3/uL 0.0 - 0.1 K/uL 0.1 0.1 0.1  Abs Immature Granulocytes 0.00 - 0.07 K/uL  0.01  Immature Grans (Abs) 0.0 - 0.1 x10E3/uL 0.0 0.0  Lymphs Not Estab. % 48 42  Monocytes Not Estab. % 7 7  Basos Not Estab. % 1 1  Eos Not Estab. %  2 4  EOS (ABSOLUTE) 0.0 - 0.4 x10E3/uL 0.1 0.2  !: Data is abnormal (H): Data is abnormally high (L): Data is abnormally low Rpt: View report in Results Review for more information

## 2022-04-12 LAB — MISC LABCORP TEST (SEND OUT): Labcorp test code: 551776

## 2022-05-02 ENCOUNTER — Other Ambulatory Visit: Payer: Self-pay

## 2022-05-02 ENCOUNTER — Other Ambulatory Visit: Payer: Self-pay | Admitting: Psychiatry

## 2022-05-02 DIAGNOSIS — F3181 Bipolar II disorder: Secondary | ICD-10-CM

## 2022-05-02 MED ORDER — ATORVASTATIN CALCIUM 20 MG PO TABS: 20.0000 mg | ORAL_TABLET | Freq: Every day | ORAL | 6 refills | Status: DC

## 2022-05-05 ENCOUNTER — Other Ambulatory Visit (HOSPITAL_BASED_OUTPATIENT_CLINIC_OR_DEPARTMENT_OTHER): Payer: Self-pay

## 2022-05-05 MED ORDER — COVID-19 MRNA VAC-TRIS(PFIZER) 30 MCG/0.3ML IM SUSY
0.3000 mL | PREFILLED_SYRINGE | Freq: Once | INTRAMUSCULAR | 0 refills | Status: AC
  Filled 2022-05-05: qty 0.3, 1d supply, fill #0

## 2022-05-09 ENCOUNTER — Other Ambulatory Visit: Payer: Self-pay | Admitting: Psychiatry

## 2022-05-09 DIAGNOSIS — F5105 Insomnia due to other mental disorder: Secondary | ICD-10-CM

## 2022-05-17 ENCOUNTER — Encounter: Payer: Self-pay | Admitting: Psychiatry

## 2022-05-17 ENCOUNTER — Ambulatory Visit (INDEPENDENT_AMBULATORY_CARE_PROVIDER_SITE_OTHER): Payer: No Typology Code available for payment source | Admitting: Psychiatry

## 2022-05-17 VITALS — BP 118/74 | HR 71 | Temp 98.5°F | Ht 66.0 in | Wt 173.0 lb

## 2022-05-17 DIAGNOSIS — F5105 Insomnia due to other mental disorder: Secondary | ICD-10-CM

## 2022-05-17 DIAGNOSIS — F411 Generalized anxiety disorder: Secondary | ICD-10-CM

## 2022-05-17 DIAGNOSIS — F3181 Bipolar II disorder: Secondary | ICD-10-CM | POA: Diagnosis not present

## 2022-05-17 MED ORDER — ZOLPIDEM TARTRATE ER 12.5 MG PO TBCR: 12.5000 mg | EXTENDED_RELEASE_TABLET | Freq: Every day | ORAL | 2 refills | Status: DC

## 2022-05-17 NOTE — Progress Notes (Unsigned)
[facility] MD OP Progress Note  05/17/2022 5:15 PM Darrell Kennedy  MRN:  [id number]  Chief Complaint:  Chief Complaint  Patient presents with   Follow-up   Anxiety   Medication Refill   HPI: Darrell Kennedy is a 42 year old Caucasian male, lives in [place], has a history of bipolar disorder, insomnia, hyperlipidemia, HIV-positive was evaluated in office today.  Patient today reports he is currently doing fairly well on the current medication regimen.  Denies having depression or manic episodes.  Reports anxiety symptoms are manageable.  Reports sleep is overall good on the zolpidem.  Denies side effects.  Currently compliant on Lamictal.  Denies side effects.  Reports work is going well.  Denies any suicidality, homicidality or perceptual disturbances.  Patient denies any other concerns today.  Visit Diagnosis:    ICD-10-CM   1. Bipolar II disorder, moderate, hypomanic, with mixed features, in full remission ([place])  F31.81     2. Generalized anxiety disorder  F41.1     3. Insomnia due to mental condition  F51.05 zolpidem (AMBIEN CR) 12.5 MG CR tablet   mood      Past Psychiatric History: Reviewed past psychiatric history from progress note on 02/06/2019.  Patient completed neuropsychological testing-07/26/2021-Dr. Rodenbough-did not meet criteria for ADHD Past trials of medications like BuSpar, Seroquel, trazodone.  Past Medical History:  Past Medical History:  Diagnosis Date   Anxiety    Depression    Heart murmur    HIV (human immunodeficiency virus infection) ([place])    HLD (hyperlipidemia)    Stroke [facility])     Past Surgical History:  Procedure Laterality Date   KNEE ARTHROSCOPY WITH ANTERIOR CRUCIATE LIGAMENT (ACL) REPAIR WITH HAMSTRING GRAFT Left 05/23/2021   Procedure: Left arthroscopic ACL reconstruction using quadriceps tendon autograft, lateral meniscus repair, chondroplasty of the medial femoral condyle;  Surgeon: Leim Fabry, MD;  Location: [facility] ORS;  Service:  Orthopedics;  Laterality: Left;   MOUTH SURGERY  2021   [facility]   TEE WITHOUT CARDIOVERSION N/A 03/05/2019   Procedure: TRANSESOPHAGEAL ECHOCARDIOGRAM (TEE);  Surgeon: Corey Skains, MD;  Location: [facility] ORS;  Service: Cardiovascular;  Laterality: N/A;    Family Psychiatric History: Reviewed family psychiatric history from progress note on 02/06/2019.  Family History:  Family History  Problem Relation Age of Onset   Hypertension Father    Diabetes Father    Kidney disease Father    Depression Father    Hyperlipidemia Father    Alcohol abuse Brother    Heart disease Paternal Grandmother    Drug abuse Cousin     Social History: Reviewed social history from progress note on 02/06/2019. Social History   Socioeconomic History   Marital status: Single    Spouse name: Not on file   Number of children: 0   Years of education: Not on file   Highest education level: Master's degree (e.g., MA, MS, MEng, MEd, MSW, MBA)  Occupational History   Occupation: student  Tobacco Use   Smoking status: Never   Smokeless tobacco: Never   Tobacco comments:    None  Vaping Use   Vaping Use: Never used  Substance and Sexual Activity   Alcohol use: Not Currently    Comment: social maybe once a week   Drug use: Never   Sexual activity: Yes    Birth control/protection: Condom  Other Topics Concern   Not on file  Social History Narrative   Not on file   Social Determinants of Health  Financial Resource Strain: Low Risk  (03/30/2020)   Overall Financial Resource Strain (CARDIA)    Difficulty of Paying Living Expenses: Not hard at all  Food Insecurity: No Food Insecurity (03/30/2020)   Hunger Vital Sign    Worried About Running Out of Food in the Last Year: Never true    Ran Out of Food in the Last Year: Never true  Transportation Needs: No Transportation Needs (03/30/2020)   PRAPARE - Hydrologist (Medical): No    Lack of Transportation  (Non-Medical): No  Physical Activity: Sufficiently Active (03/30/2020)   Exercise Vital Sign    Days of Exercise per Week: 5 days    Minutes of Exercise per Session: 50 min  Stress: No Stress Concern Present (03/30/2020)   [place]    Feeling of Stress : Not at all  Social Connections: Socially Isolated (03/30/2020)   Social Connection and Isolation Panel [NHANES]    Frequency of Communication with Friends and Family: More than three times a week    Frequency of Social Gatherings with Friends and Family: Once a week    Attends Religious Services: Never    Marine scientist or Organizations: No    Attends Archivist Meetings: Never    Marital Status: Never married    Allergies:  Allergies  Allergen Reactions   Amoxicillin Rash    Metabolic Disorder Labs: Lab Results  Component Value Date   HGBA1C 5.3 09/22/2021   MPG 114.02 02/12/2019   No results found for: "PROLACTIN" Lab Results  Component Value Date   CHOL 159 09/22/2021   TRIG 122 09/22/2021   HDL 46 09/22/2021   CHOLHDL 3.5 09/22/2021   VLDL 14 09/23/2019   LDLCALC 91 09/22/2021   LDLCALC 168 (H) 11/05/2020   Lab Results  Component Value Date   TSH 1.240 09/22/2021   TSH 0.965 11/05/2020    Therapeutic Level Labs: No results found for: "LITHIUM" No results found for: "VALPROATE" No results found for: "CBMZ"  Current Medications: Current Outpatient Medications  Medication Sig Dispense Refill   atorvastatin (LIPITOR) 20 MG tablet Take 1 tablet (20 mg total) by mouth daily. 30 tablet 6   dolutegravir-lamiVUDine (DOVATO) 50-300 MG tablet Take 1 tablet by mouth daily. 30 tablet 6   lamoTRIgine (LAMICTAL) 150 MG tablet TAKE 1 TABLET(150 MG) BY MOUTH TWICE DAILY 180 tablet 0   hydrOXYzine (ATARAX) 25 MG tablet Take 0.5-1 tablets (12.5-25 mg total) by mouth 2 (two) times daily as needed. For severe anxiety only (Patient not  taking: Reported on 05/17/2022) 60 tablet 1   ketoconazole (NIZORAL) 2 % shampoo Apply 1 application topically 2 (two) times a week. (Patient not taking: Reported on 05/17/2022)     [START ON 06/08/2022] zolpidem (AMBIEN CR) 12.5 MG CR tablet Take 1 tablet (12.5 mg total) by mouth at bedtime. 30 tablet 2   No current facility-administered medications for this visit.     Musculoskeletal: Strength & Muscle Tone: within normal limits Gait & Station: normal Patient leans: N/A  Psychiatric Specialty Exam: Review of Systems  Psychiatric/Behavioral: Negative.    All other systems reviewed and are negative.   Blood pressure 118/74, pulse 71, temperature 98.5 F (36.9 C), height 5\' 6"  (1.676 m), weight 173 lb (78.5 kg), SpO2 95 %.Body mass index is 27.92 kg/m.  General Appearance: Casual  Eye Contact:  Fair  Speech:  Clear and Coherent  Volume:  Normal  Mood:  Euthymic  Affect:  Congruent  Thought Process:  Goal Directed and Descriptions of Associations: Intact  Orientation:  Full (Time, Place, and Person)  Thought Content: Logical   Suicidal Thoughts:  No  Homicidal Thoughts:  No  Memory:  Immediate;   Fair Recent;   Fair Remote;   Fair  Judgement:  Fair  Insight:  Fair  Psychomotor Activity:  Normal  Concentration:  Concentration: Fair and Attention Span: Fair  Recall:  [employer] of Knowledge: Fair  Language: Fair  Akathisia:  No  Handed:  Right  AIMS (if indicated): not done  Assets:  Communication Skills Desire for Improvement Housing Social Support  ADL's:  Intact  Cognition: WNL  Sleep:  Fair   Screenings: AUDIT    Physiological scientist Office Visit from 10/07/2018 in [facility]  Alcohol Use Disorder Identification Test Final Score (AUDIT) 3      GAD-7    Flowsheet Row Office Visit from 05/17/2022 in [place] Office Visit from 02/14/2022 in [place]  Office Visit from 11/02/2021 in [place] Office Visit from 06/29/2021 in [place] Video Visit from 03/31/2021 in [place]  Total GAD-7 Score 0 0 0 12 0      PHQ2-9    [place] Office Visit from 05/17/2022 in [place] Office Visit from 03/23/2022 in [place] Office Visit from 02/14/2022 in [place] Office Visit from 11/03/2021 in [place] Office Visit from 11/02/2021 in [place]  PHQ-2 Total Score 0 0 0 0 0  PHQ-9 Total Score -- -- 0 -- --      [place] Office Visit from 05/17/2022 in [place] Office Visit from 02/14/2022 in [place] Office Visit from 06/29/2021 in [place] No Risk No Risk No Risk        Assessment and Plan: HRISTOPHER MISSILDINE is a 42 year old Caucasian male, employed, lives in [place], has a history of bipolar disorder, HIV-positive was evaluated in office today.  Patient is currently stable.  Plan Bipolar disorder type II most recent episode hypomanic mixed in remission Lamotrigine 300 mg p.o. daily in divided dosage  GAD-stable Hydroxyzine 12.5-25 mg p.o. twice daily as needed Continue CBT as needed-Mr. Jasper Loser.  Insomnia-stable Ambien CR 12.5 mg p.o. nightly as needed Reviewed [place] PMP AWARxE  Follow-up in clinic in 4 to 5 months or sooner if needed.   This note was generated in part or whole with voice recognition software. Voice recognition is usually quite accurate but there are transcription errors that can and very often do occur. I apologize for any typographical errors that were not  detected and corrected.     Ursula Alert, MD 05/18/2022, 2:28 PM

## 2022-05-18 ENCOUNTER — Encounter: Payer: Self-pay | Admitting: Infectious Diseases

## 2022-05-19 ENCOUNTER — Other Ambulatory Visit: Payer: Self-pay | Admitting: Internal Medicine

## 2022-05-19 MED ORDER — VALACYCLOVIR HCL 1 G PO TABS: 1000.0000 mg | ORAL_TABLET | Freq: Two times a day (BID) | ORAL | 0 refills | Status: AC

## 2022-07-07 ENCOUNTER — Encounter: Payer: Self-pay | Admitting: Registered Nurse

## 2022-07-07 ENCOUNTER — Telehealth: Payer: Self-pay | Admitting: Registered Nurse

## 2022-07-07 DIAGNOSIS — Z0000 Encounter for general adult medical examination without abnormal findings: Secondary | ICD-10-CM

## 2022-07-07 NOTE — Telephone Encounter (Signed)
Epic reviewed last annual labs June 2023.  Fasting executive panel and Hgba1c ordered for patient please schedule due June 2024 for [facility] Well.

## 2022-07-18 ENCOUNTER — Telehealth: Payer: Self-pay

## 2022-07-18 DIAGNOSIS — F5105 Insomnia due to other mental disorder: Secondary | ICD-10-CM

## 2022-07-18 NOTE — Telephone Encounter (Signed)
  pt left a message that he needed refill on the zolpidem. pt last seen on 05-17-22 next appt 6+-26-24.    Disp Refills Start End   zolpidem (AMBIEN CR) 12.5 MG CR tablet 30 tablet 2 06/08/2022    Sig - Route: Take 1 tablet (12.5 mg total) by mouth at bedtime. - Oral   Sent to pharmacy as: zolpidem (AMBIEN CR) 12.5 MG CR tablet   Notes to Pharmacy: Please fill on or after 06/08/2022   E-Prescribing Status: Receipt confirmed by pharmacy (05/17/2022  4:46 PM EST)

## 2022-07-18 NOTE — Telephone Encounter (Signed)
left message that rx was sent over and in [place]. with a note not to fill until feb. pt should have enough medication until may.

## 2022-07-19 MED ORDER — ZOLPIDEM TARTRATE ER 6.25 MG PO TBCR: 12.5000 mg | EXTENDED_RELEASE_TABLET | Freq: Every evening | ORAL | 0 refills | Status: DC | PRN

## 2022-07-19 NOTE — Telephone Encounter (Signed)
Pharmacy called left message that they do have the rx but they don;t have in stock. that the zolpidem is on back order. they do have a 5mg , 10mg , and some 6.25mg  ex

## 2022-07-19 NOTE — Telephone Encounter (Signed)
I have sent in a prescription for zolpidem 6.25 mg-take 2 tablets to [facility].

## 2022-07-28 ENCOUNTER — Telehealth: Payer: Self-pay

## 2022-07-28 DIAGNOSIS — F3181 Bipolar II disorder: Secondary | ICD-10-CM

## 2022-07-28 MED ORDER — LAMOTRIGINE 150 MG PO TABS: 150.0000 mg | ORAL_TABLET | Freq: Two times a day (BID) | ORAL | 1 refills | Status: DC

## 2022-07-28 NOTE — Telephone Encounter (Signed)
I have sent Lamictal to pharmacy. °

## 2022-07-28 NOTE — Telephone Encounter (Signed)
pt left a message that he had to change his pharmacy to [facility] please send his medications to new pharmacy he needs his lamictal sent at least.

## 2022-07-31 NOTE — Telephone Encounter (Signed)
pt was notified he states he had no issues getting. thank you for sending

## 2022-08-10 ENCOUNTER — Other Ambulatory Visit: Payer: No Typology Code available for payment source | Admitting: Occupational Medicine

## 2022-08-10 DIAGNOSIS — Z0000 Encounter for general adult medical examination without abnormal findings: Secondary | ICD-10-CM

## 2022-08-10 NOTE — Progress Notes (Signed)
Lab drawn tolerated well no issues noted.   

## 2022-08-11 ENCOUNTER — Other Ambulatory Visit: Payer: Self-pay | Admitting: Registered Nurse

## 2022-08-11 ENCOUNTER — Encounter: Payer: Self-pay | Admitting: Registered Nurse

## 2022-08-11 DIAGNOSIS — R7989 Other specified abnormal findings of blood chemistry: Secondary | ICD-10-CM

## 2022-08-11 LAB — CMP12+LP+TP+TSH+6AC+CBC/D/PLT
ALT: 23 IU/L (ref 0–44)
AST: 21 IU/L (ref 0–40)
Albumin/Globulin Ratio: 2 (ref 1.2–2.2)
Albumin: 4.7 g/dL (ref 4.1–5.1)
Alkaline Phosphatase: 73 IU/L (ref 44–121)
BUN/Creatinine Ratio: 13 (ref 9–20)
BUN: 18 mg/dL (ref 6–24)
Basophils Absolute: 0.1 10*3/uL (ref 0.0–0.2)
Basos: 1 %
Bilirubin Total: 0.5 mg/dL (ref 0.0–1.2)
Calcium: 9.7 mg/dL (ref 8.7–10.2)
Chloride: 100 mmol/L (ref 96–106)
Chol/HDL Ratio: 3 ratio (ref 0.0–5.0)
Cholesterol, Total: 156 mg/dL (ref 100–199)
Creatinine, Ser: 1.43 mg/dL — ABNORMAL HIGH (ref 0.76–1.27)
EOS (ABSOLUTE): 0.2 10*3/uL (ref 0.0–0.4)
Eos: 3 %
Estimated CHD Risk: <0.5 times avg. (ref 0.0–1.0)
Free Thyroxine Index: 1.9 (ref 1.2–4.9)
GGT: 16 IU/L (ref 0–65)
Globulin, Total: 2.4 g/dL (ref 1.5–4.5)
Glucose: 104 mg/dL — ABNORMAL HIGH (ref 70–99)
HDL: 52 mg/dL (ref >39)
Hematocrit: 42.5 % (ref 37.5–51.0)
Hemoglobin: 13.8 g/dL (ref 13.0–17.7)
Immature Grans (Abs): 0 10*3/uL (ref 0.0–0.1)
Immature Granulocytes: 0 %
Iron: 89 ug/dL (ref 38–169)
LDH: 207 IU/L (ref 121–224)
LDL Chol Calc (NIH): 84 mg/dL (ref 0–99)
Lymphocytes Absolute: 3.3 10*3/uL — ABNORMAL HIGH (ref 0.7–3.1)
Lymphs: 45 %
MCH: 31.2 pg (ref 26.6–33.0)
MCHC: 32.5 g/dL (ref 31.5–35.7)
MCV: 96 fL (ref 79–97)
Monocytes Absolute: 0.6 10*3/uL (ref 0.1–0.9)
Monocytes: 8 %
Neutrophils Absolute: 3.2 10*3/uL (ref 1.4–7.0)
Neutrophils: 43 %
Phosphorus: 3.9 mg/dL (ref 2.8–4.1)
Platelets: 284 10*3/uL (ref 150–450)
Potassium: 4.7 mmol/L (ref 3.5–5.2)
RBC: 4.42 x10E6/uL (ref 4.14–5.80)
RDW: 11.5 % — ABNORMAL LOW (ref 11.6–15.4)
Sodium: 138 mmol/L (ref 134–144)
T3 Uptake Ratio: 27 % (ref 24–39)
T4, Total: 7.2 ug/dL (ref 4.5–12.0)
TSH: 1.29 u[IU]/mL (ref 0.450–4.500)
Total Protein: 7.1 g/dL (ref 6.0–8.5)
Triglycerides: 108 mg/dL (ref 0–149)
Uric Acid: 4.7 mg/dL (ref 3.8–8.4)
VLDL Cholesterol Cal: 20 mg/dL (ref 5–40)
WBC: 7.5 10*3/uL (ref 3.4–10.8)
eGFR: 64 mL/min/{1.73_m2} (ref >59)

## 2022-08-11 LAB — HEMOGLOBIN A1C
Est. average glucose Bld gHb Est-mCnc: 97 mg/dL
Hgb A1c MFr Bld: 5 % (ref 4.8–5.6)

## 2022-08-16 ENCOUNTER — Other Ambulatory Visit: Payer: Self-pay | Admitting: Psychiatry

## 2022-08-16 DIAGNOSIS — F3181 Bipolar II disorder: Secondary | ICD-10-CM

## 2022-08-22 ENCOUNTER — Other Ambulatory Visit: Payer: Self-pay | Admitting: Psychiatry

## 2022-08-22 DIAGNOSIS — F5105 Insomnia due to other mental disorder: Secondary | ICD-10-CM

## 2022-08-23 ENCOUNTER — Ambulatory Visit: Payer: No Typology Code available for payment source | Admitting: Occupational Medicine

## 2022-08-23 VITALS — BP 123/72 | HR 72 | Ht 66.5 in | Wt 162.8 lb

## 2022-08-23 DIAGNOSIS — R7989 Other specified abnormal findings of blood chemistry: Secondary | ICD-10-CM

## 2022-08-23 DIAGNOSIS — Z0000 Encounter for general adult medical examination without abnormal findings: Secondary | ICD-10-CM

## 2022-08-23 NOTE — Progress Notes (Signed)
Be well insurance premium discount evaluation: Met  Epic reviewed by RN Bess Kinds and transcribed labs. Scheduled follow up lab. Tobacco attestation signed. [facility] ROI formed signed. Forms placed in the chart.   Patient given handouts for [place] pharmacies and discount drugs list,MyChart, Tele doc setup, Tele doc [address], [place] counseling and [employer] counseling.  What to do for infectious illness protocol. Given handout for list of medications that can be filled at [facility]. Given Clinic hours and Clinic Email.

## 2022-09-10 NOTE — Telephone Encounter (Signed)
RN Kimrey scheduled patient for 10/04/22 lab draw

## 2022-09-26 ENCOUNTER — Ambulatory Visit: Payer: No Typology Code available for payment source | Admitting: Infectious Diseases

## 2022-10-01 NOTE — Progress Notes (Signed)
Noted patient has had follow up labs 

## 2022-10-04 ENCOUNTER — Other Ambulatory Visit: Payer: No Typology Code available for payment source | Admitting: Occupational Medicine

## 2022-10-04 DIAGNOSIS — R7989 Other specified abnormal findings of blood chemistry: Secondary | ICD-10-CM

## 2022-10-04 NOTE — Progress Notes (Signed)
Lab drawn from Left [facility] tolerated well no issues noted.   

## 2022-10-05 ENCOUNTER — Encounter: Payer: Self-pay | Admitting: Registered Nurse

## 2022-10-05 LAB — BASIC METABOLIC PANEL
BUN/Creatinine Ratio: 14 (ref 9–20)
BUN: 18 mg/dL (ref 6–24)
CO2: 24 mmol/L (ref 20–29)
Calcium: 9.9 mg/dL (ref 8.7–10.2)
Chloride: 101 mmol/L (ref 96–106)
Creatinine, Ser: 1.32 mg/dL — ABNORMAL HIGH (ref 0.76–1.27)
Glucose: 90 mg/dL (ref 70–99)
Potassium: 4.6 mmol/L (ref 3.5–5.2)
Sodium: 143 mmol/L (ref 134–144)
eGFR: 70 mL/min/{1.73_m2} (ref >59)

## 2022-10-11 ENCOUNTER — Ambulatory Visit: Payer: No Typology Code available for payment source | Admitting: Psychiatry

## 2022-10-11 NOTE — Telephone Encounter (Signed)
Patient had labs completed April 2024 and June 2024 creatinine improved but still elevated see results notes

## 2022-10-27 ENCOUNTER — Encounter: Payer: Self-pay | Admitting: Infectious Diseases

## 2022-10-30 MED ORDER — DOXYCYCLINE MONOHYDRATE 100 MG PO TABS: 200.0000 mg | ORAL_TABLET | ORAL | 1 refills | Status: DC | PRN

## 2022-11-02 LAB — MISC LABCORP TEST (SEND OUT): Labcorp test code: 183616

## 2022-11-03 ENCOUNTER — Other Ambulatory Visit: Payer: Self-pay

## 2022-11-03 MED ORDER — DOVATO 50-300 MG PO TABS: 1.0000 | ORAL_TABLET | Freq: Every day | ORAL | 0 refills | Status: DC

## 2022-11-06 NOTE — Telephone Encounter (Signed)
Provider Be Well 2025 paperwork signed 08/24/22 met requirements HR Team notified by HR Kimrey qualified for 2025 FY insurance discount

## 2022-11-16 ENCOUNTER — Other Ambulatory Visit
Admission: RE | Admit: 2022-11-16 | Discharge: 2022-11-16 | Disposition: A | Discharge status: Home / Self Care | Payer: No Typology Code available for payment source | Source: Ambulatory Visit | Attending: Infectious Diseases | Admitting: Infectious Diseases

## 2022-11-16 ENCOUNTER — Other Ambulatory Visit (HOSPITAL_COMMUNITY)
Admission: RE | Admit: 2022-11-16 | Discharge: 2022-11-16 | Disposition: A | Discharge status: Home / Self Care | Payer: No Typology Code available for payment source | Source: Ambulatory Visit | Attending: Infectious Diseases | Admitting: Infectious Diseases

## 2022-11-16 ENCOUNTER — Encounter: Payer: Self-pay | Admitting: Infectious Diseases

## 2022-11-16 ENCOUNTER — Ambulatory Visit: Payer: No Typology Code available for payment source | Attending: Infectious Diseases | Admitting: Infectious Diseases

## 2022-11-16 VITALS — BP 108/73 | HR 66 | Temp 97.5°F | Ht 67.0 in | Wt 171.0 lb

## 2022-11-16 DIAGNOSIS — Z79899 Other long term (current) drug therapy: Secondary | ICD-10-CM | POA: Insufficient documentation

## 2022-11-16 DIAGNOSIS — B20 Human immunodeficiency virus [HIV] disease: Secondary | ICD-10-CM | POA: Insufficient documentation

## 2022-11-16 DIAGNOSIS — Z113 Encounter for screening for infections with a predominantly sexual mode of transmission: Secondary | ICD-10-CM | POA: Insufficient documentation

## 2022-11-16 DIAGNOSIS — Z21 Asymptomatic human immunodeficiency virus [HIV] infection status: Secondary | ICD-10-CM | POA: Insufficient documentation

## 2022-11-16 DIAGNOSIS — E785 Hyperlipidemia, unspecified: Secondary | ICD-10-CM | POA: Diagnosis not present

## 2022-11-16 DIAGNOSIS — Z79624 Long term (current) use of inhibitors of nucleotide synthesis: Secondary | ICD-10-CM | POA: Diagnosis not present

## 2022-11-16 DIAGNOSIS — F319 Bipolar disorder, unspecified: Secondary | ICD-10-CM | POA: Insufficient documentation

## 2022-11-16 LAB — CHLAMYDIA/NGC RT PCR (ARMC ONLY)
Chlamydia Tr: NOT DETECTED
Chlamydia Tr: NOT DETECTED
Chlamydia Tr: NOT DETECTED
N gonorrhoeae: NOT DETECTED
N gonorrhoeae: NOT DETECTED
N gonorrhoeae: NOT DETECTED

## 2022-11-16 LAB — COMPREHENSIVE METABOLIC PANEL
ALT: 24 U/L (ref 0–44)
AST: 22 U/L (ref 15–41)
Albumin: 4.5 g/dL (ref 3.5–5.0)
Alkaline Phosphatase: 57 U/L (ref 38–126)
Anion gap: 9 (ref 5–15)
BUN: 18 mg/dL (ref 6–20)
CO2: 26 mmol/L (ref 22–32)
Calcium: 9.6 mg/dL (ref 8.9–10.3)
Chloride: 103 mmol/L (ref 98–111)
Creatinine, Ser: 1.31 mg/dL — ABNORMAL HIGH (ref 0.61–1.24)
GFR, Estimated: >60 mL/min (ref >60)
Glucose, Bld: 108 mg/dL — ABNORMAL HIGH (ref 70–99)
Potassium: 4.4 mmol/L (ref 3.5–5.1)
Sodium: 138 mmol/L (ref 135–145)
Total Bilirubin: 0.8 mg/dL (ref 0.3–1.2)
Total Protein: 7.4 g/dL (ref 6.5–8.1)

## 2022-11-16 LAB — CBC WITH DIFFERENTIAL/PLATELET
Abs Immature Granulocytes: 0.02 10*3/uL (ref 0.00–0.07)
Basophils Absolute: 0.1 10*3/uL (ref 0.0–0.1)
Basophils Relative: 1 %
Eosinophils Absolute: 0.3 10*3/uL (ref 0.0–0.5)
Eosinophils Relative: 4 %
HCT: 45 % (ref 39.0–52.0)
Hemoglobin: 14.8 g/dL (ref 13.0–17.0)
Immature Granulocytes: 0 %
Lymphocytes Relative: 44 %
Lymphs Abs: 3.3 10*3/uL (ref 0.7–4.0)
MCH: 31.1 pg (ref 26.0–34.0)
MCHC: 32.9 g/dL (ref 30.0–36.0)
MCV: 94.5 fL (ref 80.0–100.0)
Monocytes Absolute: 0.5 10*3/uL (ref 0.1–1.0)
Monocytes Relative: 7 %
Neutro Abs: 3.2 10*3/uL (ref 1.7–7.7)
Neutrophils Relative %: 44 %
Platelets: 290 10*3/uL (ref 150–400)
RBC: 4.76 MIL/uL (ref 4.22–5.81)
RDW: 11.5 % (ref 11.5–15.5)
WBC: 7.4 10*3/uL (ref 4.0–10.5)
nRBC: 0 % (ref 0.0–0.2)

## 2022-11-16 MED ORDER — DOVATO 50-300 MG PO TABS: 1.0000 | ORAL_TABLET | Freq: Every day | ORAL | 6 refills | Status: DC

## 2022-11-16 NOTE — Progress Notes (Signed)
NAME: Darrell Kennedy  DOB: 04/04/1981  MRN: [id number]  Date/Time: 11/16/2022 8:55 AM  Subjective:  Follow up HIV visit Last seen Dec 2023 Doing well No new complaints 100% adherent to Dovato Last Vl < 20 and Cd4  In a steady relationship with 1 person ? Darrell Kennedy is a 42 y.o. male with a history of HIV Followed by psychiatrist Dr.Eppen for Bipolar disorder which is well controlled 05/23/21: Repair of Left knee anterior cruciate ligament tear Left lateral meniscus tear   02/12/19   with TIA like episode when he felt the left arm and leg tingling preceded by palpitation. Was transient. underwent work up with Neg MRI, ECHO, TEE which did not show any intracardiac shunting. He saw neurologist Dr.Shah at the [facility] on 02/17/19  and he sent hypercoagulable panel ESR, CRP - Factor V Leiden mutation - Antithrombin III functional assay - MTHFR gene mutation - Protein C and S activity (functional assay) - Homocysteine - Lupus anticoagulant comprehensive - Antiphospholipid syndrome (includes aPTT, PT, INR, Thrombin time, Dilute Viper Venom Time, Hexagonal phase phospholipid, Anticoardiolipin antibody IgG and IgM, Beta - 2 Glycoprotein IgG and IgM,   Anticardiolipin IgM  ab was elevated at 13 ( 0-12 ) N and homocysteine was 16( 0-14.5) Started on methylfolate  and aspirin . seen heme once Dr.Finnegan .    HIV history HIV diagnosed NOV 2015 in  [place] when he had flu like symptoms and knew he had acute HIV and went and got tested. Nadir Cd4 was > 700  He saw Dr.PAul Katrinka Blazing and was started on isentress and truvada. He later [facility] to [facility]. Since end of 2017 he was off treatment because of lack of insurance. He moved to [facility] in 2018 and did not engage in care because of lack on insurance until  Jan 2020  Nadir Cd4 484 VL >200,000 OI none HAARt history truvada isentress Genvoya Biktarvy Acquired thru-sex with men Genotype done in [place] ? Past Medical History:  Diagnosis Date    Anxiety    Depression    Heart murmur    HIV (human immunodeficiency virus infection) (H[facility])    HLD (hyperlipidemia)    Stroke [facility])     Surgical History -none  FH Father- diabetes, HTN Mother-adrenal tumor removed  ?SH Non smoker No illicit drug use Occasional alcohol Has traveled to [place] in 2013 Lives with his parents One steady partner- monogamous relationship Partner knows his status ? Current Outpatient Medications  Medication Sig Dispense Refill   dolutegravir-lamiVUDine (DOVATO) 50-300 MG tablet Take 1 tablet by mouth daily. 30 tablet 0   doxycycline (ADOXA) 100 MG tablet Take 2 tablets (200 mg total) by mouth as needed. 30 tablet 1   hydrOXYzine (ATARAX) 25 MG tablet Take 0.5-1 tablets (12.5-25 mg total) by mouth 2 (two) times daily as needed. For severe anxiety only 60 tablet 1   ketoconazole (NIZORAL) 2 % shampoo Apply 1 application  topically 2 (two) times a week.     lamoTRIgine (LAMICTAL) 150 MG tablet Take 1 tablet (150 mg total) by mouth 2 (two) times daily. 180 tablet 1   valACYclovir (VALTREX) 1000 MG tablet Take 1 tablet (1,000 mg total) by mouth 2 (two) times daily. 20 tablet 0   No current facility-administered medications for this visit.    REVIEW OF SYSTEMS:  Const: negative fever, negative chills, negative weight loss Eyes: negative diplopia or visual changes, negative eye pain ENT: negative coryza, negative sore throat Resp: negative cough, hemoptysis, dyspnea  Cards: negative for chest pain, palpitations, lower extremity edema GU: negative for frequency, dysuria and hematuria Skin: negative for rash and pruritus Heme: negative for easy bruising and gum/nose bleeding MS: negative for myalgias, arthralgias, back pain and muscle weakness Neurolo:negative for headaches, dizziness, vertigo, memory problems    Objective:  VITALS:  BP 108/73   Pulse 66   Temp (!) 97.5 F (36.4 C) (Temporal)   Ht 5\' 7"  (1.702 m)   Wt 171 lb (77.6 kg)   BMI 26.78  kg/m  PHYSICAL EXAM:  General: Looks well  head: Normocephalic, without obvious abnormality, atraumatic. Eyes: Conjunctivae clear, anicteric sclerae. Pupils are equal Nose: Nares normal. No drainage or sinus tenderness. Throat: Lips, mucosa, and tongue normal. No Thrush Lungs: Clear to auscultation bilaterally. No Wheezing or Rhonchi. No rales. Heart: Regular rate and rhythm, no murmur, rub or gallop. Abdomen: Soft, non-tender,not distended. Bowel sounds normal. No masses Extremities: Extremities normal, atraumatic, no cyanosis. No edema. No clubbing Skin: No rashes or lesions. Not Jaundiced Lymph: Cervical, supraclavicular normal. Neurologic: Grossly non-focal Pertinent Labs  IMAGING RESULTS: Health maintenance Vaccination  Vaccine Date last given comment  Influenza Oct 2023   Hepatitis B 05/15/2014,2/29 &11/24/14 [place]  Hepatitis A 05/15/14 &06/15/14   Prevnar-PCV-13 04/02/2014   Pneumovac-PPSV-23 09/10/18   TdaP 05/15/14   HPV 09/23/19, 10/21/19 and 03/18/20   Shingrix ( zoster vaccine)    Corona virus vaccine X 3 08/05/19, 5/21, 02/06/20, 01/18/21   Monkey pox- aug.sept  ______________________  Labs Lab Result  Date comment  HIV VL <20 6/22   CD4 775 (28.7%) 11/03/21   Genotype Negative 04/09/2014 [facility] [place]  [id number] NEG 04/02/2014   HIV antibody Reactive 05/07/18   RPR NR 03/23/22   Quantiferon Gold NR 03/24/21   Hep C ab NR 03/24/21   Hepatitis B-ab,ag,c Ab-positive 05/07/18 vaccinated  Hepatitis A-IgM, IgG /T     Lipid 159/46/91 and TGL 122 09/22/21   GC/CHL NR 03/24/22   PAP Benign 10/07/21   HB,PLT,Cr, LFT 14/284/1.34/N      Preventive  Procedure Result  Date comment  colonoscopy     ANAL pap Benign reactive 10/07/21   Dental exam     Opthal       Impression/Recommendation ? HIV- and not AIDS- on Dovato- last VL < 20 and Cd4 > 700- will do labs today Pt has Doxycycline for PEP  In a stable relationship with a male partner who is Neg and knows his  status   H/o TIA  like episode-once  investigated and found to have borderline high Anticardiolipin IgM and homocysteine- on aspirin and methyl folate Followed by heme onc who does not think the labs are significant It was thought to be due to his medication ( antidepressant/  Bipolar  disorder- followed by Psychiatrist- on lamictal 250mg  QD,  ambien PRN q Hs  Hyperlipidemia- on atorvastatin   __Anal pap  done today  STD screen done today_____________________________________________ Discussed with patient in detail. Follow up 6 months

## 2022-11-23 ENCOUNTER — Telehealth: Payer: Self-pay

## 2022-11-23 DIAGNOSIS — R85619 Unspecified abnormal cytological findings in specimens from anus: Secondary | ICD-10-CM

## 2022-11-23 NOTE — Telephone Encounter (Signed)
Patient advised of abnormal pap. Patient verbalized understanding. Referral placed for the patient.  Sascha Baugher T Pricilla Loveless

## 2022-11-23 NOTE — Telephone Encounter (Signed)
-----   Message from Lynn Ito sent at 11/22/2022  3:02 PM EDT ----- Please let him know that anal pap was not normal but not concerning- We can refer him for anoscopy ----- Message ----- From: Interface, Lab In [place] Sent: 11/16/2022   9:32 AM EDT To: Lynn Ito, MD

## 2022-11-29 ENCOUNTER — Ambulatory Visit: Payer: No Typology Code available for payment source | Admitting: Psychiatry

## 2022-11-29 ENCOUNTER — Encounter: Payer: Self-pay | Admitting: Psychiatry

## 2022-11-29 VITALS — BP 105/72 | HR 71 | Temp 98.2°F | Ht 67.0 in | Wt 173.2 lb

## 2022-11-29 DIAGNOSIS — F5105 Insomnia due to other mental disorder: Secondary | ICD-10-CM

## 2022-11-29 DIAGNOSIS — F411 Generalized anxiety disorder: Secondary | ICD-10-CM | POA: Diagnosis not present

## 2022-11-29 DIAGNOSIS — F3181 Bipolar II disorder: Secondary | ICD-10-CM | POA: Diagnosis not present

## 2022-11-29 NOTE — Progress Notes (Unsigned)
BH MD OP Progress Note  11/29/2022 5:34 PM Darrell Kennedy  MRN:  [id number]  Chief Complaint:  Chief Complaint  Patient presents with   Follow-up   Anxiety   Depression   Medication Refill   HPI: Darrell Kennedy is a 42 year old Caucasian male, lives in [place], has a history of bipolar disorder, insomnia, hyperlipidemia, HIV positive was evaluated in office today.  Patient today reports he had a good past few months.  He is currently doing so well at his work that he recently got a promotion.  He is enjoying it.  Was able to take a vacation to [place] town in [place] and enjoyed it.  Reports his relationship is going well.  Patient reports mood symptoms are stable.  The current combination is effective.  Denies any mania, hypomanic symptoms or depression symptoms.  Patient denies any significant anxiety attacks.  Reports he is currently working on his sleep hygiene and that has definitely helped with his sleep.  He no longer needs the Ambien and has not taken it in the past 6 weeks or so.  Patient denies any suicidality, homicidality or perceptual disturbances.  Patient denies any other concerns today.  Visit Diagnosis:    ICD-10-CM   1. Bipolar II disorder, moderate, hypomanic, with mixed features, in full remission (HCC)  F31.81     2. Generalized anxiety disorder  F41.1     3. Insomnia due to mental condition  F51.05    Mood      Past Psychiatric History: I have reviewed past psychiatric history from progress note on 02/06/2019.  Patient completed neuropsychological testing-07/26/2021-Dr. Rodenbough-did not meet criteria for ADHD. Past trials of medications like BuSpar, Seroquel, trazodone.  Past Medical History:  Past Medical History:  Diagnosis Date   Anxiety    Depression    Heart murmur    HIV (human immunodeficiency virus infection) (HCC)    HLD (hyperlipidemia)    Stroke [facility])     Past Surgical History:  Procedure Laterality Date   KNEE  ARTHROSCOPY WITH ANTERIOR CRUCIATE LIGAMENT (ACL) REPAIR WITH HAMSTRING GRAFT Left 05/23/2021   Procedure: Left arthroscopic ACL reconstruction using quadriceps tendon autograft, lateral meniscus repair, chondroplasty of the medial femoral condyle;  Surgeon: Signa Kell, MD;  Location: [facility] ORS;  Service: Orthopedics;  Laterality: Left;   MOUTH SURGERY  2021   [facility]   TEE WITHOUT CARDIOVERSION N/A 03/05/2019   Procedure: TRANSESOPHAGEAL ECHOCARDIOGRAM (TEE);  Surgeon: Lamar Blinks, MD;  Location: [facility] ORS;  Service: Cardiovascular;  Laterality: N/A;    Family Psychiatric History: I have reviewed family psychiatric history from progress note on 02/06/2019.  Family History:  Family History  Problem Relation Age of Onset   Hypertension Father    Diabetes Father    Kidney disease Father    Depression Father    Hyperlipidemia Father    Alcohol abuse Brother    Heart disease Paternal Grandmother    Drug abuse Cousin     Social History: I have reviewed social history from progress note on 02/06/2019. Social History   Socioeconomic History   Marital status: Single    Spouse name: Not on file   Number of children: 0   Years of education: Not on file   Highest education level: Master's degree (e.g., MA, MS, MEng, MEd, MSW, MBA)  Occupational History   Occupation: student  Tobacco Use   Smoking status: Never   Smokeless tobacco: Never   Tobacco comments:    None  Vaping Use   Vaping status: Never Used  Substance and Sexual Activity   Alcohol use: Not Currently    Comment: social maybe once a week   Drug use: Never   Sexual activity: Yes    Birth control/protection: Condom  Other Topics Concern   Not on file  Social History Narrative   Not on file   Social Determinants of Health   Financial Resource Strain: Low Risk  (03/30/2020)   Overall Financial Resource Strain (CARDIA)    Difficulty of Paying Living Expenses: Not hard at all  Food Insecurity: No Food  Insecurity (03/30/2020)   Hunger Vital Sign    Worried About Running Out of Food in the Last Year: Never true    Ran Out of Food in the Last Year: Never true  Transportation Needs: No Transportation Needs (03/30/2020)   PRAPARE - Administrator, Civil Service (Medical): No    Lack of Transportation (Non-Medical): No  Physical Activity: Sufficiently Active (03/30/2020)   Exercise Vital Sign    Days of Exercise per Week: 5 days    Minutes of Exercise per Session: 50 min  Stress: No Stress Concern Present (03/30/2020)   [employer] of Occupational Health - Occupational Stress Questionnaire    Feeling of Stress : Not at all  Social Connections: Socially Isolated (03/30/2020)   Social Connection and Isolation Panel [NHANES]    Frequency of Communication with Friends and Family: More than three times a week    Frequency of Social Gatherings with Friends and Family: Once a week    Attends Religious Services: Never    Database administrator or Organizations: No    Attends Banker Meetings: Never    Marital Status: Never married    Allergies:  Allergies  Allergen Reactions   Amoxicillin Rash    Metabolic Disorder Labs: Lab Results  Component Value Date   HGBA1C 5.0 08/10/2022   MPG 114.02 02/12/2019   No results found for: "PROLACTIN" Lab Results  Component Value Date   CHOL 156 08/10/2022   TRIG 108 08/10/2022   HDL 52 08/10/2022   CHOLHDL 3.0 08/10/2022   VLDL 14 09/23/2019   LDLCALC 84 08/10/2022   LDLCALC 91 09/22/2021   Lab Results  Component Value Date   TSH 1.290 08/10/2022   TSH 1.240 09/22/2021    Therapeutic Level Labs: No results found for: "LITHIUM" No results found for: "VALPROATE" No results found for: "CBMZ"  Current Medications: Current Outpatient Medications  Medication Sig Dispense Refill   dolutegravir-lamiVUDine (DOVATO) 50-300 MG tablet Take 1 tablet by mouth daily. 30 tablet 6   doxycycline (ADOXA) 100 MG tablet  Take 2 tablets (200 mg total) by mouth as needed. 30 tablet 1   hydrOXYzine (ATARAX) 25 MG tablet Take 0.5-1 tablets (12.5-25 mg total) by mouth 2 (two) times daily as needed. For severe anxiety only 60 tablet 1   ketoconazole (NIZORAL) 2 % shampoo Apply 1 application  topically 2 (two) times a week.     lamoTRIgine (LAMICTAL) 150 MG tablet Take 1 tablet (150 mg total) by mouth 2 (two) times daily. 180 tablet 1   valACYclovir (VALTREX) 1000 MG tablet Take 1 tablet (1,000 mg total) by mouth 2 (two) times daily. 20 tablet 0   No current facility-administered medications for this visit.     Musculoskeletal: Strength & Muscle Tone: within normal limits Gait & Station: normal Patient leans: N/A  Psychiatric Specialty Exam: Review of Systems  Psychiatric/Behavioral: Negative.  Blood pressure 105/72, pulse 71, temperature 98.2 F (36.8 C), temperature source Skin, height 5\' 7"  (1.702 m), weight 173 lb 3.2 oz (78.6 kg).Body mass index is 27.13 kg/m.  General Appearance: Casual  Eye Contact:  Fair  Speech:  Clear and Coherent  Volume:  Normal  Mood:  Euthymic  Affect:  Congruent  Thought Process:  Goal Directed and Descriptions of Associations: Intact  Orientation:  Full (Time, Place, and Person)  Thought Content: Logical   Suicidal Thoughts:  No  Homicidal Thoughts:  No  Memory:  Immediate;   Fair Recent;   Fair Remote;   Fair  Judgement:  Fair  Insight:  Fair  Psychomotor Activity:  Normal  Concentration:  Concentration: Fair and Attention Span: Fair  Recall:  [employer] of Knowledge: Fair  Language: Fair  Akathisia:  No  Handed:  Right  AIMS (if indicated): not done  Assets:  Communication Skills Desire for Improvement Housing Social Support  ADL's:  Intact  Cognition: WNL  Sleep:  Fair   Screenings: AUDIT    Loss adjuster, chartered Office Visit from 10/07/2018 in [facility]  Alcohol Use Disorder Identification Test Final Score (AUDIT) 3       GAD-7    Flowsheet Row Office Visit from 11/29/2022 in [facility] Office Visit from 05/17/2022 in [facility] Office Visit from 02/14/2022 in [facility] Office Visit from 11/02/2021 in [facility] Office Visit from 06/29/2021 in [facility]  Total GAD-7 Score 0 0 0 0 12      PHQ2-9    Flowsheet Row Office Visit from 11/29/2022 in [facility] Office Visit from 05/17/2022 in [facility] Office Visit from 03/23/2022 in [facility] Office Visit from 02/14/2022 in [facility] Office Visit from 11/03/2021 in [facility]  PHQ-2 Total Score 0 0 0 0 0  PHQ-9 Total Score 0 -- -- 0 --      Flowsheet Row Office Visit from 11/29/2022 in [facility] Office Visit from 05/17/2022 in [facility] Office Visit from 02/14/2022 in [facility]  C-SSRS RISK CATEGORY No Risk No Risk No Risk        Assessment and Plan: Darrell Kennedy is a 42 year old Caucasian male, employed, lives in [place], has a history of bipolar disorder, was evaluated in office today.  Patient is currently stable.  Plan Bipolar disorder type II most recent episode hypomanic mixed in remission Lamotrigine 300 mg p.o. daily in divided dosage.  GAD-stable Hydroxyzine 12.5-25 mg p.o. twice daily as needed Continue CBT as needed-Mr. Viviann Spare MacAllister  Insomnia-stable Patient does have Ambien CR 12.5 mg p.o. nightly as needed available however has not been using it.  Follow-up in clinic in 6 months or sooner if needed.   Collaboration of Care:  Collaboration of Care: Referral or follow-up with counselor/therapist AEB patient encouraged to continue CBT.  Patient/Guardian was advised Release of Information must be obtained prior to any record release in order to collaborate their care with an outside provider. Patient/Guardian was advised if they have not already done so to contact the registration department to sign all necessary forms in order for [place] to release information regarding their care.   Consent: Patient/Guardian gives verbal consent for  treatment and assignment of benefits for services provided during this visit. Patient/Guardian expressed understanding and agreed to proceed.   This note was generated in part or whole with voice recognition software. Voice recognition is usually quite accurate but there are transcription errors that can and very often do occur. I apologize for any typographical errors that were not detected and corrected.    Jomarie Longs, MD 11/30/2022, 6:14 PM

## 2022-12-04 ENCOUNTER — Ambulatory Visit: Payer: Self-pay | Admitting: General Surgery

## 2022-12-04 NOTE — H&P (Signed)
Expand All Collapse All      REFERRING PHYSICIAN:  Lynn Ito,*   PROVIDER:  Elenora Gamma, MD   MRN: [id number] DOB: 24-Jan-1981 DATE OF ENCOUNTER: 12/04/2022   Subjective    Chief Complaint: New Consultation       History of Present Illness: Darrell Kennedy is a 42 y.o. male who is seen today as an office consultation at the request of Dr. Rivka Safer for evaluation of New Consultation .  42 year old male with HIV and undetectable levels who presents to the office for evaluation of a recently abnormal anal Pap test.  This showed atypical squamous cells of undetermined significance.  He had these results in 2022 but, according to him, did not have this investigated further.     Review of Systems: A complete review of systems was obtained from the patient.  I have reviewed this information and discussed as appropriate with the patient.  See HPI as well for other ROS.       Medical History: Past Medical History      Past Medical History:  Diagnosis Date   Depression     HIV infection (CMS/HHS-HCC)     Hyperlipidemia     Motion sickness      in the back of a car.        Problem List     Patient Active Problem List  Diagnosis   Human immunodeficiency virus (HIV) disease (CMS/HHS-HCC)   TIA (transient ischemic attack)   Bipolar 2 disorder (CMS/HHS-HCC)   PFO (patent foramen ovale) (HHS-HCC)   High cholesterol   HIV (human immunodeficiency virus infection) (CMS/HHS-HCC)        Past Surgical History       Past Surgical History:  Procedure Laterality Date   EXTRACTION TEETH Bilateral 03/19/2020    Procedure: EXTRACTION TEETH: #17, 18, 32;  Surgeon: Tarri Abernethy, DMD;  Location: [facility];  Service: Plastic Surgery;  Laterality: Bilateral;   Left knee ACL reconstruction with quadriceps tendon autograft, lateral meniscus repair, femoral condyle chondroplasty Left 05/23/2021    Dr. Allena Katz   wisdom teeth             Allergies      Allergies  Allergen Reactions   Amoxicillin (Bulk) Rash        Medications Ordered Prior to Encounter        Current Outpatient Medications on File Prior to Visit  Medication Sig Dispense Refill   atorvastatin (LIPITOR) 20 MG tablet Take 20 mg by mouth Daily after lunch          dolutegravir sodium/lamivudine (DOVATO ORAL) Take by mouth       lamoTRIgine (LAMICTAL) 25 MG tablet Take 250 mg by mouth every morning          aspirin 81 MG EC tablet Take 81 mg by mouth every morning    (Patient not taking: Reported on 12/04/2022)       BIKTARVY 50-200-25 mg tablet Take 1 tablet by mouth every morning          escitalopram oxalate (LEXAPRO) 10 MG tablet Take 10 mg by mouth nightly       ibuprofen (MOTRIN) 800 MG tablet Take 1 tablet (800 mg total) by mouth every 8 (eight) hours (Patient not taking: Reported on 12/04/2022) 60 tablet 2   zolpidem (AMBIEN) 10 mg tablet Take 10 mg by mouth nightly as needed        No current facility-administered medications on file prior to  visit.        Family History       Family History  Problem Relation Age of Onset   No Known Problems Mother     Diabetes Father     Myocardial Infarction (Heart attack) Paternal Grandmother     Anesthesia problems Neg Hx          Tobacco Use History  Social History       Tobacco Use  Smoking Status Never  Smokeless Tobacco Never        Social History  Social History         Socioeconomic History   Marital status: Single  Tobacco Use   Smoking status: Never   Smokeless tobacco: Never  Vaping Use   Vaping status: Never Used  Substance and Sexual Activity   Alcohol use: Yes      Comment: 1-3 drinks per month.   Drug use: Never   Sexual activity: Yes  Social History Narrative    ** Merged History Encounter **             Objective:          Vitals:    12/04/22 0901 12/04/22 0902  BP: 102/70    Pulse: 74    Temp: 36.7 C (98.1 F)    SpO2: 98%    Weight: 78.7 kg (173  lb 9.6 oz)    Height: 168.9 cm (5' 6.5")    PainSc:   0-No pain      Exam Gen: NAD Abd: soft     Labs, Imaging and Diagnostic Testing: Cytology results reviewed   Assessment and Plan:  Diagnoses and all orders for this visit:   Pap smear of anus with ASCUS     42 year old male with HIV who presents to the office with ASCUS noted on anal Pap.  I recommended high-resolution anoscopy with possible biopsy and ablation.  We discussed the chances of recurrence as well as risk of bleeding and pain.  All questions were answered.  We will schedule this at his convenience.   Vanita Panda, MD Colon and Rectal Surgery [facility]

## 2022-12-11 ENCOUNTER — Other Ambulatory Visit: Payer: Self-pay | Admitting: Infectious Diseases

## 2023-01-19 ENCOUNTER — Encounter (HOSPITAL_BASED_OUTPATIENT_CLINIC_OR_DEPARTMENT_OTHER): Payer: Self-pay | Admitting: General Surgery

## 2023-01-22 ENCOUNTER — Encounter (HOSPITAL_BASED_OUTPATIENT_CLINIC_OR_DEPARTMENT_OTHER): Payer: Self-pay | Admitting: General Surgery

## 2023-01-22 NOTE — Progress Notes (Signed)
Spoke w/ via phone for pre-op interview--- pt Lab needs dos----  no       Lab results------ no COVID test -----patient states asymptomatic no test needed Arrive at ------- 1200 on 01-26-2023 NPO after MN NO Solid Food.  Clear liquids from MN until--- 1100 Med rec completed Medications to take morning of surgery ----- dovato, lamictal Diabetic medication ----- n/a Patient instructed no nail polish to be worn day of surgery Patient instructed to bring photo id and insurance card day of surgery Patient aware to have Driver (ride ) / caregiver    for 24 hours after surgery - mother, Nicholos Johns Patient Special Instructions ----- n/a Pre-Op special Instructions ----- pt stated he had told the office , he had to be at work morning of surgery , could only do afternoon, but could arrive at 1200.  This is the earliest he can arrive. Patient verbalized understanding of instructions that were given at this phone interview. Patient denies chest pain, sob, fever, cough at the interview.

## 2023-01-25 ENCOUNTER — Encounter (HOSPITAL_COMMUNITY): Payer: Self-pay | Admitting: Anesthesiology

## 2023-01-25 NOTE — Anesthesia Preprocedure Evaluation (Signed)
Anesthesia Evaluation  Patient identified by MRN, date of birth, ID band Patient awake    Reviewed: Allergy & Precautions, NPO status , Patient's Chart, lab work & pertinent test results  Airway       Comment: Previous grade I view with [place] 3, easy mask Dental   Pulmonary sleep apnea           Cardiovascular + Valvular Problems/Murmurs   HLD, PFO  TTE 03/06/2019: IMPRESSIONS     1. Left ventricular ejection fraction, by visual estimation, is 55 to  60%. The left ventricle has normal function. There is no left ventricular  hypertrophy.   2. Global right ventricle has normal systolic function.The right  ventricular size is normal. No increase in right ventricular wall  thickness.   3. Left atrial size was normal.   4. Right atrial size was normal.   5. The mitral valve is normal in structure. Trace mitral valve  regurgitation.   6. The tricuspid valve is normal in structure. Tricuspid valve  regurgitation is trivial.   7. The aortic valve is normal in structure. Aortic valve regurgitation is  not visualized.   8. The pulmonic valve was grossly normal. Pulmonic valve regurgitation is  trivial.   9. Evidence of atrial level shunting detected by color flow Doppler.     Neuro/Psych  PSYCHIATRIC DISORDERS Anxiety  Bipolar Disorder   TIA (01/2019)   GI/Hepatic   Endo/Other    Renal/GU      Musculoskeletal   Abdominal   Peds  Hematology Anticardiolipin antibody positive   Anesthesia Other Findings HIV - on Dovato  Reproductive/Obstetrics                              Anesthesia Physical Anesthesia Plan  ASA: 3  Anesthesia Plan:    Post-op Pain Management:    Induction:   PONV Risk Score and Plan:   Airway Management Planned:   Additional Equipment:   Intra-op Plan:   Post-operative Plan:   Informed Consent:      Dental advisory given  Plan Discussed with: CRNA  and Anesthesiologist  Anesthesia Plan Comments:          Anesthesia Quick Evaluation

## 2023-01-26 ENCOUNTER — Other Ambulatory Visit: Payer: Self-pay | Admitting: Psychiatry

## 2023-01-26 ENCOUNTER — Ambulatory Visit (HOSPITAL_BASED_OUTPATIENT_CLINIC_OR_DEPARTMENT_OTHER)
Admission: RE | Admit: 2023-01-26 | Payer: No Typology Code available for payment source | Source: Home / Self Care | Admitting: General Surgery

## 2023-01-26 DIAGNOSIS — Z01818 Encounter for other preprocedural examination: Secondary | ICD-10-CM

## 2023-01-26 DIAGNOSIS — F3181 Bipolar II disorder: Secondary | ICD-10-CM

## 2023-01-26 HISTORY — DX: Insomnia, unspecified: G47.00

## 2023-01-26 HISTORY — DX: Unspecified abnormal cytological findings in specimens from anus: R85.619

## 2023-01-26 HISTORY — DX: Presence of spectacles and contact lenses: Z97.3

## 2023-01-26 HISTORY — DX: Bipolar II disorder: F31.81

## 2023-01-26 HISTORY — DX: Hyperlipidemia, unspecified: E78.5

## 2023-01-26 SURGERY — HIGH RESOLUTION ANOSCOPY
Anesthesia: Monitor Anesthesia Care

## 2023-02-08 ENCOUNTER — Ambulatory Visit: Payer: No Typology Code available for payment source

## 2023-02-08 DIAGNOSIS — Z23 Encounter for immunization: Secondary | ICD-10-CM

## 2023-04-09 NOTE — Progress Notes (Signed)
Error

## 2023-04-23 ENCOUNTER — Telehealth: Payer: No Typology Code available for payment source | Admitting: Psychiatry

## 2023-05-22 ENCOUNTER — Other Ambulatory Visit: Payer: Self-pay

## 2023-05-22 ENCOUNTER — Ambulatory Visit: Payer: No Typology Code available for payment source | Admitting: Infectious Diseases

## 2023-05-22 DIAGNOSIS — Z113 Encounter for screening for infections with a predominantly sexual mode of transmission: Secondary | ICD-10-CM

## 2023-05-22 DIAGNOSIS — Z79899 Other long term (current) drug therapy: Secondary | ICD-10-CM

## 2023-05-22 DIAGNOSIS — B20 Human immunodeficiency virus [HIV] disease: Secondary | ICD-10-CM

## 2023-06-25 ENCOUNTER — Other Ambulatory Visit: Payer: Self-pay | Admitting: Infectious Diseases

## 2023-07-10 ENCOUNTER — Ambulatory Visit: Attending: Infectious Diseases | Admitting: Infectious Diseases

## 2023-07-10 ENCOUNTER — Encounter: Payer: Self-pay | Admitting: Infectious Diseases

## 2023-07-10 ENCOUNTER — Other Ambulatory Visit
Admission: RE | Admit: 2023-07-10 | Discharge: 2023-07-10 | Disposition: A | Discharge status: Home / Self Care | Source: Ambulatory Visit | Attending: Infectious Diseases | Admitting: Infectious Diseases

## 2023-07-10 VITALS — BP 125/78 | HR 61 | Temp 97.9°F | Ht 67.0 in | Wt 176.0 lb

## 2023-07-10 DIAGNOSIS — R768 Other specified abnormal immunological findings in serum: Secondary | ICD-10-CM | POA: Diagnosis not present

## 2023-07-10 DIAGNOSIS — B20 Human immunodeficiency virus [HIV] disease: Secondary | ICD-10-CM | POA: Diagnosis not present

## 2023-07-10 DIAGNOSIS — Z79899 Other long term (current) drug therapy: Secondary | ICD-10-CM | POA: Diagnosis present

## 2023-07-10 DIAGNOSIS — Z8673 Personal history of transient ischemic attack (TIA), and cerebral infarction without residual deficits: Secondary | ICD-10-CM | POA: Insufficient documentation

## 2023-07-10 DIAGNOSIS — F319 Bipolar disorder, unspecified: Secondary | ICD-10-CM | POA: Diagnosis not present

## 2023-07-10 DIAGNOSIS — Z79624 Long term (current) use of inhibitors of nucleotide synthesis: Secondary | ICD-10-CM | POA: Insufficient documentation

## 2023-07-10 DIAGNOSIS — Z113 Encounter for screening for infections with a predominantly sexual mode of transmission: Secondary | ICD-10-CM | POA: Diagnosis present

## 2023-07-10 DIAGNOSIS — E785 Hyperlipidemia, unspecified: Secondary | ICD-10-CM | POA: Diagnosis not present

## 2023-07-10 DIAGNOSIS — Z21 Asymptomatic human immunodeficiency virus [HIV] infection status: Secondary | ICD-10-CM | POA: Insufficient documentation

## 2023-07-10 LAB — CBC WITH DIFFERENTIAL/PLATELET
Abs Immature Granulocytes: 0.01 10*3/uL (ref 0.00–0.07)
Basophils Absolute: 0.1 10*3/uL (ref 0.0–0.1)
Basophils Relative: 1 %
Eosinophils Absolute: 0.3 10*3/uL (ref 0.0–0.5)
Eosinophils Relative: 3 %
HCT: 41.8 % (ref 39.0–52.0)
Hemoglobin: 14.2 g/dL (ref 13.0–17.0)
Immature Granulocytes: 0 %
Lymphocytes Relative: 40 %
Lymphs Abs: 3 10*3/uL (ref 0.7–4.0)
MCH: 31.8 pg (ref 26.0–34.0)
MCHC: 34 g/dL (ref 30.0–36.0)
MCV: 93.5 fL (ref 80.0–100.0)
Monocytes Absolute: 0.5 10*3/uL (ref 0.1–1.0)
Monocytes Relative: 7 %
Neutro Abs: 3.7 10*3/uL (ref 1.7–7.7)
Neutrophils Relative %: 49 %
Platelets: 306 10*3/uL (ref 150–400)
RBC: 4.47 MIL/uL (ref 4.22–5.81)
RDW: 11.8 % (ref 11.5–15.5)
WBC: 7.7 10*3/uL (ref 4.0–10.5)
nRBC: 0 % (ref 0.0–0.2)

## 2023-07-10 LAB — LIPID PANEL
Cholesterol: 164 mg/dL (ref 0–200)
HDL: 55 mg/dL (ref >40)
LDL Cholesterol: 86 mg/dL (ref 0–99)
Total CHOL/HDL Ratio: 3 ratio
Triglycerides: 117 mg/dL (ref <150)
VLDL: 23 mg/dL (ref 0–40)

## 2023-07-10 LAB — CHLAMYDIA/NGC RT PCR (ARMC ONLY)
Chlamydia Tr: NOT DETECTED
Chlamydia Tr: NOT DETECTED
Chlamydia Tr: NOT DETECTED
N gonorrhoeae: NOT DETECTED
N gonorrhoeae: NOT DETECTED
N gonorrhoeae: NOT DETECTED

## 2023-07-10 LAB — COMPREHENSIVE METABOLIC PANEL
ALT: 22 U/L (ref 0–44)
AST: 25 U/L (ref 15–41)
Albumin: 4.6 g/dL (ref 3.5–5.0)
Alkaline Phosphatase: 51 U/L (ref 38–126)
Anion gap: 7 (ref 5–15)
BUN: 16 mg/dL (ref 6–20)
CO2: 29 mmol/L (ref 22–32)
Calcium: 9.9 mg/dL (ref 8.9–10.3)
Chloride: 104 mmol/L (ref 98–111)
Creatinine, Ser: 1.44 mg/dL — ABNORMAL HIGH (ref 0.61–1.24)
GFR, Estimated: >60 mL/min (ref >60)
Glucose, Bld: 108 mg/dL — ABNORMAL HIGH (ref 70–99)
Potassium: 4.9 mmol/L (ref 3.5–5.1)
Sodium: 140 mmol/L (ref 135–145)
Total Bilirubin: 0.7 mg/dL (ref 0.0–1.2)
Total Protein: 7.6 g/dL (ref 6.5–8.1)

## 2023-07-10 NOTE — Progress Notes (Signed)
 NAME: Darrell Kennedy  DOB: 1980-09-17  MRN: [id number]  Date/Time: 07/10/2023 12:29 PM  Subjective:  Follow up HIV visit Last seen Dec 2023 Doing well No new complaints 100% adherent to Dovato Last Vl < 20 and Cd4  In a steady relationship with 1 person ? Darrell Kennedy is a 43 y.o. male with a history of HIV Followed by psychiatrist Dr.Eppen for Bipolar disorder which is well controlled 05/23/21: Repair of Left knee anterior cruciate ligament tear Left lateral meniscus tear   02/12/19   with TIA like episode when he felt the left arm and leg tingling preceded by palpitation. Was transient. underwent work up with Neg MRI, ECHO, TEE which did not show any intracardiac shunting. He saw neurologist Dr.Shah at the [facility] clinic on 02/17/19  and he sent hypercoagulable panel ESR, CRP - Factor V Leiden mutation - Antithrombin III functional assay - MTHFR gene mutation - Protein C and S activity (functional assay) - Homocysteine - Lupus anticoagulant comprehensive - Antiphospholipid syndrome (includes aPTT, PT, INR, Thrombin time, Dilute Viper Venom Time, Hexagonal phase phospholipid, Anticoardiolipin antibody IgG and IgM, Beta - 2 Glycoprotein IgG and IgM,   Anticardiolipin IgM  ab was elevated at 13 ( 0-12 ) N and homocysteine was 16( 0-14.5) Started on methylfolate  and aspirin . seen heme once Dr.Finnegan .    HIV history HIV diagnosed NOV 2015 in  [place] when he had flu like symptoms and knew he had acute HIV and went and got tested. Nadir Cd4 was > 700  He saw Dr.PAul Katrinka Blazing and was started on isentress and truvada. He later changed to [facility]. Since end of 2017 he was off treatment because of lack of insurance. He moved to [facility] in 2018 and did not engage in care because of lack on insurance until  Jan 2020  Nadir Cd4 484 VL >200,000 OI none HAARt history truvada isentress Genvoya Biktarvy Acquired thru-sex with men Genotype done in [place] ? 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   Anxiety    Bipolar II disorder, moderate, depressed, with mixed features, in full remission (HCC)    followed by dr Elna Breslow (psychologist)   Heart murmur    History of transient ischemic attack (TIA) 01/2019   possible TIA vs clash of medication;  work-up, normal echo & carotid doppler,  negative MRI, TEE w/ bubble study by cardiology , dr Gwen Pounds (in epic 03-05-2019) insig interatrial shunting otherwise normal;  positive anticardiolipin antibody & elevated homocysteine, started asa / folate   HIV (human immunodeficiency virus infection) (HCC) 02/2014   followed by dr Neysa Hotter (ID)   HLD (hyperlipidemia)    Hyperlipidemia    Insomnia    Wears glasses     Surgical History -none  FH Father- diabetes, HTN Mother-adrenal tumor removed  ?SH Non smoker No illicit drug use Occasional alcohol Has traveled to [place] in 2013 Lives with his parents One steady partner- monogamous relationship Partner knows his status, he is on Prep and is negative ? Current Outpatient Medications  Medication Sig Dispense Refill   atorvastatin (LIPITOR) 20 MG tablet TAKE ONE TABLET BY MOUTH ONE TIME DAILY 30 tablet 5   DOVATO 50-300 MG tablet TAKE ONE TABLET BY MOUTH ONE TIME DAILY 30 tablet 6   doxycycline (ADOXA) 100 MG tablet Take 2 tablets (200 mg total) by mouth as needed. 30 tablet 1   ketoconazole (NIZORAL) 2 % shampoo Apply  1 application  topically 2 (two) times a week.     lamoTRIgine (LAMICTAL) 150 MG tablet TAKE ONE TABLET BY MOUTH TWICE A DAY 180 tablet 1   valACYclovir (VALTREX) 1000 MG tablet Take 1 tablet (1,000 mg total) by mouth 2 (two) times daily. (Patient taking differently: Take 1,000 mg by mouth 2 (two) times daily as needed.) 20 tablet 0   No current facility-administered medications for this visit.    REVIEW OF SYSTEMS:  Const: negative fever, negative chills, negative weight loss Eyes: negative  diplopia or visual changes, negative eye pain ENT: negative coryza, negative sore throat Resp: negative cough, hemoptysis, dyspnea Cards: negative for chest pain, palpitations, lower extremity edema GU: negative for frequency, dysuria and hematuria Skin: negative for rash and pruritus Heme: negative for easy bruising and gum/nose bleeding MS: negative for myalgias, arthralgias, back pain and muscle weakness Neurolo:negative for headaches, dizziness, vertigo, memory problems    Objective:  VITALS:  BP 125/78   Pulse 61   Temp 97.9 F (36.6 C) (Temporal)   Ht 5\' 7"  (1.702 m)   Wt 176 lb (79.8 kg)   BMI 27.57 kg/m  PHYSICAL EXAM:  General: Looks well  head: Normocephalic, without obvious abnormality, atraumatic. Eyes: Conjunctivae clear, anicteric sclerae. Pupils are equal Nose: Nares normal. No drainage or sinus tenderness. Throat: Lips, mucosa, and tongue normal. No Thrush Lungs: Clear to auscultation bilaterally. No Wheezing or Rhonchi. No rales. Heart: Regular rate and rhythm, no murmur, rub or gallop. Abdomen: Soft, non-tender,not distended. Bowel sounds normal. No masses Extremities: Extremities normal, atraumatic, no cyanosis. No edema. No clubbing Skin: No rashes or lesions. Not Jaundiced Lymph: Cervical, supraclavicular normal. Neurologic: Grossly non-focal Pertinent Labs  IMAGING RESULTS: Health maintenance Vaccination  Vaccine Date last given comment  Influenza Oct 2023   Hepatitis B 05/15/2014,2/29 &11/24/14 [place]  Hepatitis A 05/15/14 &06/15/14   Prevnar-PCV-13 04/02/2014   Pneumovac-PPSV-23 09/10/18   TdaP 05/15/14   HPV 09/23/19, 10/21/19 and 03/18/20   Shingrix ( zoster vaccine)    Corona virus vaccine X 3 08/05/19, 5/21, 02/06/20, 01/18/21   Monkey pox- aug.sept  ______________________  Labs Lab Result  Date comment  HIV VL <20 6/22   CD4 775 (28.7%) 11/03/21   Genotype Negative 04/09/2014 [facility]- [place]  [id number] NEG 04/02/2014   HIV antibody Reactive  05/07/18   RPR NR 03/23/22   Quantiferon Gold NR 03/24/21   Hep C ab NR 03/24/21   Hepatitis B-ab,ag,c Ab-positive 05/07/18 vaccinated  Hepatitis A-IgM, IgG /T     Lipid 159/46/91 and TGL 122 09/22/21   GC/CHL NR 03/24/22   PAP Benign 10/07/21   HB,PLT,Cr, LFT 14/284/1.34/N      Preventive  Procedure Result  Date comment  colonoscopy     ANAL pap Benign reactive 10/07/21   Dental exam     Opthal       Impression/Recommendation ? HIV- and not AIDS- on Dovato- last VL < 20 and Cd4 > 700- will do labs today Pt has Doxycycline for PEP  In a stable relationship with a male partner who is Neg and knows his status   H/o TIA  like episode-once  investigated and found to have borderline high Anticardiolipin IgM and homocysteine- on aspirin and methyl folate Followed by heme onc who does not think the labs are significant It was thought to be due to his medication ( antidepressant/  Bipolar  disorder- followed by Psychiatrist- on lamictal 250mg  QD,  ambien PRN q Hs  Hyperlipidemia- on  atorvastatin   __Anal pap  done today  STD screen done today_____________________________________________ Discussed with patient in detail. Follow up 6-9 months

## 2023-07-11 LAB — T-HELPER CELLS CD4/CD8 %
% CD 4 Pos. Lymph.: 30.1 % — ABNORMAL LOW (ref 30.8–58.5)
Absolute CD 4 Helper: 873 /uL (ref 359–1519)
Basophils Absolute: 0.1 10*3/uL (ref 0.0–0.2)
Basos: 1 %
CD3+CD4+ Cells/CD3+CD8+ Cells Bld: 0.68 — ABNORMAL LOW (ref 0.92–3.72)
CD3+CD8+ Cells # Bld: 1282 /uL — ABNORMAL HIGH (ref 109–897)
CD3+CD8+ Cells NFr Bld: 44.2 % — ABNORMAL HIGH (ref 12.0–35.5)
EOS (ABSOLUTE): 0.3 10*3/uL (ref 0.0–0.4)
Eos: 4 %
Hematocrit: 42.7 % (ref 37.5–51.0)
Hemoglobin: 14.4 g/dL (ref 13.0–17.7)
Immature Grans (Abs): 0 10*3/uL (ref 0.0–0.1)
Immature Granulocytes: 0 %
Lymphocytes Absolute: 2.9 10*3/uL (ref 0.7–3.1)
Lymphs: 38 %
MCH: 31.8 pg (ref 26.6–33.0)
MCHC: 33.7 g/dL (ref 31.5–35.7)
MCV: 94 fL (ref 79–97)
Monocytes Absolute: 0.6 10*3/uL (ref 0.1–0.9)
Monocytes: 8 %
Neutrophils Absolute: 3.7 10*3/uL (ref 1.4–7.0)
Neutrophils: 49 %
Platelets: 319 10*3/uL (ref 150–450)
RBC: 4.53 x10E6/uL (ref 4.14–5.80)
RDW: 12.1 % (ref 11.6–15.4)
WBC: 7.4 10*3/uL (ref 3.4–10.8)

## 2023-07-11 LAB — HIV-1 RNA QUANT-NO REFLEX-BLD
HIV 1 RNA Quant: <20 {copies}/mL
LOG10 HIV-1 RNA: UNDETERMINED {Log_copies}/mL

## 2023-07-11 LAB — RPR: RPR Ser Ql: NONREACTIVE

## 2023-07-14 LAB — QUANTIFERON-TB GOLD PLUS: QuantiFERON-TB Gold Plus: NEGATIVE

## 2023-07-14 LAB — QUANTIFERON-TB GOLD PLUS (RQFGPL)
QuantiFERON Mitogen Value: >10 [IU]/mL
QuantiFERON Nil Value: 0.24 [IU]/mL
QuantiFERON TB1 Ag Value: 0.17 [IU]/mL
QuantiFERON TB2 Ag Value: 0.23 [IU]/mL

## 2023-07-18 LAB — CYTOLOGY - PAP
Comment: NEGATIVE
Diagnosis: NEGATIVE
High risk HPV: NEGATIVE

## 2023-07-23 ENCOUNTER — Telehealth: Payer: Self-pay

## 2023-07-23 NOTE — Telephone Encounter (Signed)
-----   Message from Lynn Ito sent at 07/20/2023  7:19 PM EDT ----- Labs look good ----- Message ----- From: Leory Plowman, Lab In [place] Sent: 07/10/2023  12:25 PM EDT To: Lynn Ito, MD

## 2023-07-26 ENCOUNTER — Other Ambulatory Visit: Payer: Self-pay | Admitting: Psychiatry

## 2023-07-26 DIAGNOSIS — F3181 Bipolar II disorder: Secondary | ICD-10-CM

## 2023-08-31 ENCOUNTER — Telehealth: Payer: Self-pay | Admitting: Psychiatry

## 2023-08-31 ENCOUNTER — Other Ambulatory Visit: Payer: Self-pay | Admitting: Psychiatry

## 2023-08-31 DIAGNOSIS — F3181 Bipolar II disorder: Secondary | ICD-10-CM

## 2023-08-31 NOTE — Telephone Encounter (Signed)
Message left to call and schedule.

## 2023-08-31 NOTE — Telephone Encounter (Signed)
 Received medication refill request. Patient not seen since 11-19-22. Message left to call and schedule an appointment for future refills and if wants continued care at this office.

## 2023-08-31 NOTE — Telephone Encounter (Signed)
 Please contact the patient to schedule a follow up with Dr. Eappen.

## 2023-10-03 ENCOUNTER — Other Ambulatory Visit

## 2023-10-10 ENCOUNTER — Telehealth: Payer: Self-pay | Admitting: Psychiatry

## 2023-10-10 ENCOUNTER — Other Ambulatory Visit

## 2023-10-10 VITALS — BP 120/83 | Ht 67.0 in | Wt 179.0 lb

## 2023-10-10 DIAGNOSIS — F3181 Bipolar II disorder: Secondary | ICD-10-CM

## 2023-10-10 DIAGNOSIS — Z0000 Encounter for general adult medical examination without abnormal findings: Secondary | ICD-10-CM

## 2023-10-10 MED ORDER — LAMOTRIGINE 150 MG PO TABS: 150.0000 mg | ORAL_TABLET | Freq: Two times a day (BID) | ORAL | 1 refills | Status: DC

## 2023-10-10 NOTE — Progress Notes (Signed)
Be well[facility]abs

## 2023-10-10 NOTE — Telephone Encounter (Signed)
 I have sent Lamictal  to pharmacy.  Patient needs to keep his appointment for any further refills.

## 2023-10-11 LAB — CMP12+LP+TP+TSH+6AC+CBC/D/PLT
ALT: 20 IU/L (ref 0–44)
AST: 20 IU/L (ref 0–40)
Albumin: 4.6 g/dL (ref 4.1–5.1)
Alkaline Phosphatase: 76 IU/L (ref 44–121)
BUN/Creatinine Ratio: 10 (ref 9–20)
BUN: 15 mg/dL (ref 6–24)
Basophils Absolute: 0.1 10*3/uL (ref 0.0–0.2)
Basos: 1 %
Bilirubin Total: 0.4 mg/dL (ref 0.0–1.2)
Calcium: 10 mg/dL (ref 8.7–10.2)
Chloride: 101 mmol/L (ref 96–106)
Chol/HDL Ratio: 3.5 ratio (ref 0.0–5.0)
Cholesterol, Total: 161 mg/dL (ref 100–199)
Creatinine, Ser: 1.45 mg/dL — ABNORMAL HIGH (ref 0.76–1.27)
EOS (ABSOLUTE): 0.3 10*3/uL (ref 0.0–0.4)
Eos: 4 %
Estimated CHD Risk: 0.6 times avg. (ref 0.0–1.0)
Free Thyroxine Index: 2.2 (ref 1.2–4.9)
GGT: 20 IU/L (ref 0–65)
Globulin, Total: 2.7 g/dL (ref 1.5–4.5)
Glucose: 102 mg/dL — ABNORMAL HIGH (ref 70–99)
HDL: 46 mg/dL (ref >39)
Hematocrit: 45.3 % (ref 37.5–51.0)
Hemoglobin: 14.4 g/dL (ref 13.0–17.7)
Immature Grans (Abs): 0 10*3/uL (ref 0.0–0.1)
Immature Granulocytes: 0 %
Iron: 83 ug/dL (ref 38–169)
LDH: 210 IU/L (ref 121–224)
LDL Chol Calc (NIH): 95 mg/dL (ref 0–99)
Lymphocytes Absolute: 3.3 10*3/uL — ABNORMAL HIGH (ref 0.7–3.1)
Lymphs: 44 %
MCH: 31 pg (ref 26.6–33.0)
MCHC: 31.8 g/dL (ref 31.5–35.7)
MCV: 98 fL — ABNORMAL HIGH (ref 79–97)
Monocytes Absolute: 0.5 10*3/uL (ref 0.1–0.9)
Monocytes: 7 %
Neutrophils Absolute: 3.4 10*3/uL (ref 1.4–7.0)
Neutrophils: 44 %
Phosphorus: 4 mg/dL (ref 2.8–4.1)
Platelets: 293 10*3/uL (ref 150–450)
Potassium: 4.5 mmol/L (ref 3.5–5.2)
RBC: 4.64 x10E6/uL (ref 4.14–5.80)
RDW: 11.8 % (ref 11.6–15.4)
Sodium: 138 mmol/L (ref 134–144)
T3 Uptake Ratio: 28 % (ref 24–39)
T4, Total: 7.7 ug/dL (ref 4.5–12.0)
TSH: 1.77 u[IU]/mL (ref 0.450–4.500)
Total Protein: 7.3 g/dL (ref 6.0–8.5)
Triglycerides: 112 mg/dL (ref 0–149)
Uric Acid: 4.4 mg/dL (ref 3.8–8.4)
VLDL Cholesterol Cal: 20 mg/dL (ref 5–40)
WBC: 7.6 10*3/uL (ref 3.4–10.8)
eGFR: 62 mL/min/{1.73_m2} (ref >59)

## 2023-10-11 LAB — HEMOGLOBIN A1C
Est. average glucose Bld gHb Est-mCnc: 103 mg/dL
Hgb A1c MFr Bld: 5.2 % (ref 4.8–5.6)

## 2023-10-15 ENCOUNTER — Ambulatory Visit: Payer: Self-pay | Admitting: Student

## 2023-12-03 ENCOUNTER — Other Ambulatory Visit: Payer: Self-pay

## 2023-12-03 ENCOUNTER — Ambulatory Visit: Admitting: Psychiatry

## 2023-12-03 ENCOUNTER — Encounter: Payer: Self-pay | Admitting: Psychiatry

## 2023-12-03 VITALS — BP 117/79 | HR 62 | Temp 97.7°F | Ht 67.0 in | Wt 181.4 lb

## 2023-12-03 DIAGNOSIS — F411 Generalized anxiety disorder: Secondary | ICD-10-CM

## 2023-12-03 DIAGNOSIS — F5105 Insomnia due to other mental disorder: Secondary | ICD-10-CM | POA: Diagnosis not present

## 2023-12-03 DIAGNOSIS — F3181 Bipolar II disorder: Secondary | ICD-10-CM

## 2023-12-03 MED ORDER — LAMOTRIGINE 150 MG PO TABS: 150.0000 mg | ORAL_TABLET | Freq: Two times a day (BID) | ORAL | 1 refills | Status: AC

## 2023-12-03 NOTE — Progress Notes (Signed)
 BH MD OP Progress Note  12/03/2023 12:46 PM [name]  MRN:  [id number]  Chief Complaint:  Chief Complaint  Patient presents with   Follow-up   Depression   Anxiety   Medication Refill   Discussed the use of AI scribe software for clinical note transcription with the patient, who gave verbal consent to proceed.  History of Present Illness Darrell Kennedy is a 43 year old Caucasian male, lives in [place], has a history of bipolar disorder, insomnia, hyperlipidemia, HIV-positive was evaluated in office today for a follow-up appointment.  He reports feeling well overall and describes his current mood as stable. He states that he has not experienced any depressive or manic episodes recently. He occasionally has brief periods of heightened energy, but he notes these have not escalated to unmanageable levels or impacted his functioning. He denies any manic or hypomanic symptoms. He maintains a good work-life balance and engages in enjoyable activities.  He reports that his sleep remains stable and he has had no sleep issues since discontinuing Ambien . Although he occasionally goes to bed later than intended, he does not find this disruptive to his functioning or productivity at work.  He continues to take lamotrigine  150 mg twice daily with good effect and has not made any recent changes to his medication regimen. He confirms discontinuation of Ambien . He continues to participate in cognitive behavioral therapy, typically attending sessions once a month or every six weeks, depending on his needs.  He denies current use of drugs, cannabis, or tobacco and reports very infrequent alcohol use.  He is currently employed as Production designer, theatre/television/film of HR, working Monday through Friday 8 to 5 with occasional remote work on weekends. He lives alone, recently moved out of his parents' home, and maintains a long-distance relationship, visiting his partner every 3 to 4 weeks and communicating regularly. He  regularly attends the gym and prioritizes physical health.  Denies any suicidality, homicidality or perceptual disturbances.  Visit Diagnosis:    ICD-10-CM   1. Bipolar II disorder, moderate, hypomanic, with mixed features, in full remission (HCC)  F31.81 lamoTRIgine  (LAMICTAL ) 150 MG tablet   hypomanic , mixed features    2. Generalized anxiety disorder  F41.1     3. Insomnia due to mental condition  F51.05    mood      Past Psychiatric History: I have reviewed past psychiatric history from progress note on 02/06/2019.  Patient completed neuropsychological testing-07/26/2021-Dr. Rodenbough-did not meet criteria for ADHD.  Past trials of medications like BuSpar , Seroquel , trazodone .  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   Anxiety    Bipolar II disorder, moderate, depressed, with mixed features, in full remission (HCC)    followed by dr coby (psychologist)   Heart murmur    History of transient ischemic attack (TIA) 01/2019   possible TIA vs clash of medication;  work-up, normal echo & carotid doppler,  negative MRI, TEE w/ bubble study by cardiology , dr hester (in epic 03-05-2019) insig interatrial shunting otherwise normal;  positive anticardiolipin antibody & elevated homocysteine, started asa / folate   HIV (human immunodeficiency virus infection) (HCC) 02/2014   followed by dr alberta (ID)   HLD (hyperlipidemia)    Hyperlipidemia    Insomnia    Wears glasses     Past Surgical History:  Procedure Laterality Date   KNEE ARTHROSCOPY WITH ANTERIOR CRUCIATE LIGAMENT (ACL) REPAIR WITH HAMSTRING GRAFT  Left 05/23/2021   Procedure: Left arthroscopic ACL reconstruction using quadriceps tendon autograft, lateral meniscus repair, chondroplasty of the medial femoral condyle;  Surgeon: Tobie Priest, MD;  Location: [facility] ORS;  Service: Orthopedics;  Laterality: Left;   TEE  WITHOUT CARDIOVERSION N/A 03/05/2019   Procedure: TRANSESOPHAGEAL ECHOCARDIOGRAM (TEE);  Surgeon: Hester Wolm PARAS, MD;  Location: [facility] ORS;  Service: Cardiovascular;  Laterality: N/A;   WISDOM TOOTH EXTRACTION  03/19/2020   @[facility]    w/ General anesthesia    Family Psychiatric History: I have reviewed family psychiatric history from progress note on 02/06/2019.  Family History:  Family History  Problem Relation Age of Onset   Hypertension Father    Diabetes Father    Kidney disease Father    Depression Father    Hyperlipidemia Father    Alcohol abuse Brother    Heart disease Paternal Grandmother    Drug abuse Cousin     Social History: I have reviewed social history from progress note on 02/06/2019. Social History   Socioeconomic History   Marital status: Single    Spouse name: Not on file   Number of children: 0   Years of education: Not on file   Highest education level: Master's degree (e.g., MA, MS, MEng, MEd, MSW, MBA)  Occupational History   Occupation: student  Tobacco Use   Smoking status: Never   Smokeless tobacco: Never  Vaping Use   Vaping status: Never Used  Substance and Sexual Activity   Alcohol use: Not Currently    Comment: seldom   Drug use: Never   Sexual activity: Yes    Birth control/protection: Condom  Other Topics Concern   Not on file  Social History Narrative   Not on file   Social Drivers of Health   Financial Resource Strain: Low Risk  (03/30/2020)   Overall Financial Resource Strain (CARDIA)    Difficulty of Paying Living Expenses: Not hard at all  Food Insecurity: No Food Insecurity (03/30/2020)   Hunger Vital Sign    Worried About Running Out of Food in the Last Year: Never true    Ran Out of Food in the Last Year: Never true  Transportation Needs: No Transportation Needs (03/30/2020)   PRAPARE - Administrator, Civil Service (Medical): No    Lack of Transportation (Non-Medical): No  Physical Activity: Sufficiently  Active (03/30/2020)   Exercise Vital Sign    Days of Exercise per Week: 5 days    Minutes of Exercise per Session: 50 min  Stress: No Stress Concern Present (03/30/2020)   [employer] of Occupational Health - Occupational Stress Questionnaire    Feeling of Stress : Not at all  Social Connections: Socially Isolated (03/30/2020)   Social Connection and Isolation Panel    Frequency of Communication with Friends and Family: More than three times a week    Frequency of Social Gatherings with Friends and Family: Once a week    Attends Religious Services: Never    Database administrator or Organizations: No    Attends Banker Meetings: Never    Marital Status: Never married    Allergies:  Allergies  Allergen Reactions   Amoxicillin Rash    Metabolic Disorder Labs: Lab Results  Component Value Date   HGBA1C 5.2 10/10/2023   MPG 114.02 02/12/2019   No results found for: PROLACTIN Lab Results  Component Value Date   CHOL 161 10/10/2023   TRIG 112 10/10/2023   HDL 46 10/10/2023  CHOLHDL 3.5 10/10/2023   VLDL 23 07/10/2023   LDLCALC 95 10/10/2023   LDLCALC 86 07/10/2023   Lab Results  Component Value Date   TSH 1.770 10/10/2023   TSH 1.290 08/10/2022    Therapeutic Level Labs: No results found for: LITHIUM No results found for: VALPROATE No results found for: CBMZ  Current Medications: Current Outpatient Medications  Medication Sig Dispense Refill   atorvastatin  (LIPITOR) 20 MG tablet TAKE ONE TABLET BY MOUTH ONE TIME DAILY 30 tablet 5   DOVATO  50-300 MG tablet TAKE ONE TABLET BY MOUTH ONE TIME DAILY 30 tablet 6   doxycycline  (ADOXA) 100 MG tablet Take 2 tablets (200 mg total) by mouth as needed. 30 tablet 1   ketoconazole (NIZORAL) 2 % shampoo Apply 1 application  topically 2 (two) times a week.     lamoTRIgine  (LAMICTAL ) 150 MG tablet Take 1 tablet (150 mg total) by mouth 2 (two) times daily. 180 tablet 1   valACYclovir  (VALTREX ) 1000 MG  tablet Take 1 tablet (1,000 mg total) by mouth 2 (two) times daily. (Patient taking differently: Take 1,000 mg by mouth 2 (two) times daily as needed.) 20 tablet 0   No current facility-administered medications for this visit.     Musculoskeletal: Strength & Muscle Tone: within normal limits Gait & Station: normal Patient leans: N/A  Psychiatric Specialty Exam: Review of Systems  Psychiatric/Behavioral: Negative.      Blood pressure 117/79, pulse 62, temperature 97.7 F (36.5 C), temperature source Temporal, height 5' 7 (1.702 m), weight 181 lb 6.4 oz (82.3 kg).Body mass index is 28.41 kg/m.  General Appearance: Casual  Eye Contact:  Good  Speech:  Clear and Coherent  Volume:  Normal  Mood:  Euthymic  Affect:  Appropriate  Thought Process:  Goal Directed and Descriptions of Associations: Intact  Orientation:  Full (Time, Place, and Person)  Thought Content: Logical   Suicidal Thoughts:  No  Homicidal Thoughts:  No  Memory:  Immediate;   Fair Recent;   Fair Remote;   Fair  Judgement:  Fair  Insight:  Fair  Psychomotor Activity:  Normal  Concentration:  Concentration: Fair and Attention Span: Fair  Recall:  [employer] of Knowledge: Fair  Language: Fair  Akathisia:  No  Handed:  Right  AIMS (if indicated): not done  Assets:  Manufacturing systems engineer Desire for Improvement Housing Social Support Transportation  ADL's:  Intact  Cognition: WNL  Sleep:  Fair   Screenings: AUDIT    Garment/textile technologist Visit from 10/07/2018 in [facility]  Alcohol Use Disorder Identification Test Final Score (AUDIT) 3   GAD-7    Flowsheet Row Office Visit from 12/03/2023 in [facility] Office Visit from 11/29/2022 in [facility] Office Visit from 05/17/2022 in [facility] Office Visit from 02/14/2022 in [facility] Office Visit from 11/02/2021 in [facility]  Total GAD-7 Score 0 0 0 0 0   PHQ2-9    Flowsheet Row Office Visit from 12/03/2023 in [facility] Office Visit from 07/10/2023 in [facility] Office Visit from 11/29/2022 in [facility] Office Visit from 05/17/2022 in [facility] Office Visit from 03/23/2022 in [facility]  PHQ-2 Total Score 0 0 0 0 0  PHQ-9 Total Score -- -- 0 -- --  Flowsheet Row Office Visit from 12/03/2023 in [facility] Office Visit from 11/29/2022 in [facility] Office Visit from 05/17/2022 in [facility]  C-SSRS RISK CATEGORY No Risk No Risk No Risk     Assessment and Plan: KRISTEN BUSHWAY is a 43 year old Caucasian male, employed, lives in [place] who was evaluated in office today, discussed assessment and plan as noted below.  Bipolar disorder type II most recent episode hypomanic mixed in remission Currently denies any significant manic hypomanic or depression symptoms.  Well managed on the current medication regimen. Continue Lamotrigine  300 mg p.o. daily in divided dosage.  Generalized anxiety disorder-stable Currently denies any significant breakthrough anxiety or generalized anxiety symptoms. Continue CBT as needed.  Mr. Elspeth Salaam Continue Hydroxyzine  12.5-25 mg twice a day as needed  Insomnia-stable Reports overall sleep is good.  No longer taking Ambien . Continue sleep hygiene techniques.  Will benefit from repeating TSH levels, patient to discuss with primary care provider.  Follow-up Follow-up in clinic in 6 months or sooner if needed.    Collaboration of Care: Collaboration of Care: Referral or follow-up  with counselor/therapist AEB encouraged to continue psychotherapy sessions.  Patient/Guardian was advised Release of Information must be obtained prior to any record release in order to collaborate their care with an outside provider. Patient/Guardian was advised if they have not already done so to contact the registration department to sign all necessary forms in order for us  to release information regarding their care.   Consent: Patient/Guardian gives verbal consent for treatment and assignment of benefits for services provided during this visit. Patient/Guardian expressed understanding and agreed to proceed.  This note was generated in part or whole with voice recognition software. Voice recognition is usually quite accurate but there are transcription errors that can and very often do occur. I apologize for any typographical errors that were not detected and corrected.     Aristidis Talerico, MD 12/05/2023, 8:37 AM

## 2023-12-05 NOTE — Telephone Encounter (Signed)
 Patient read results note 10/15/23 per epic read receipt and [facility] form given to HR

## 2023-12-13 ENCOUNTER — Ambulatory Visit: Admitting: Psychiatry

## 2024-01-07 ENCOUNTER — Other Ambulatory Visit: Payer: Self-pay | Admitting: Infectious Diseases

## 2024-02-04 ENCOUNTER — Ambulatory Visit

## 2024-04-03 ENCOUNTER — Ambulatory Visit: Admitting: Infectious Diseases

## 2024-04-08 ENCOUNTER — Other Ambulatory Visit: Payer: Self-pay | Admitting: Infectious Diseases

## 2024-05-08 ENCOUNTER — Encounter: Payer: Self-pay | Admitting: Infectious Diseases

## 2024-05-08 ENCOUNTER — Ambulatory Visit: Attending: Infectious Diseases | Admitting: Infectious Diseases

## 2024-05-08 ENCOUNTER — Other Ambulatory Visit
Admission: RE | Admit: 2024-05-08 | Discharge: 2024-05-08 | Disposition: A | Discharge status: Home / Self Care | Source: Ambulatory Visit | Attending: Infectious Diseases | Admitting: Infectious Diseases

## 2024-05-08 VITALS — BP 131/77 | HR 58 | Temp 98.0°F | Ht 67.0 in | Wt 181.0 lb

## 2024-05-08 DIAGNOSIS — Z79624 Long term (current) use of inhibitors of nucleotide synthesis: Secondary | ICD-10-CM | POA: Insufficient documentation

## 2024-05-08 DIAGNOSIS — D6861 Antiphospholipid syndrome: Secondary | ICD-10-CM | POA: Diagnosis not present

## 2024-05-08 DIAGNOSIS — D6862 Lupus anticoagulant syndrome: Secondary | ICD-10-CM | POA: Insufficient documentation

## 2024-05-08 DIAGNOSIS — R85619 Unspecified abnormal cytological findings in specimens from anus: Secondary | ICD-10-CM | POA: Diagnosis present

## 2024-05-08 DIAGNOSIS — E785 Hyperlipidemia, unspecified: Secondary | ICD-10-CM | POA: Insufficient documentation

## 2024-05-08 DIAGNOSIS — Z79899 Other long term (current) drug therapy: Secondary | ICD-10-CM | POA: Diagnosis not present

## 2024-05-08 DIAGNOSIS — B20 Human immunodeficiency virus [HIV] disease: Secondary | ICD-10-CM | POA: Insufficient documentation

## 2024-05-08 DIAGNOSIS — Z1211 Encounter for screening for malignant neoplasm of colon: Secondary | ICD-10-CM

## 2024-05-08 DIAGNOSIS — Z21 Asymptomatic human immunodeficiency virus [HIV] infection status: Secondary | ICD-10-CM | POA: Insufficient documentation

## 2024-05-08 DIAGNOSIS — E7212 Methylenetetrahydrofolate reductase deficiency: Secondary | ICD-10-CM | POA: Insufficient documentation

## 2024-05-08 DIAGNOSIS — Z8673 Personal history of transient ischemic attack (TIA), and cerebral infarction without residual deficits: Secondary | ICD-10-CM | POA: Insufficient documentation

## 2024-05-08 DIAGNOSIS — Z113 Encounter for screening for infections with a predominantly sexual mode of transmission: Secondary | ICD-10-CM | POA: Diagnosis present

## 2024-05-08 DIAGNOSIS — F3181 Bipolar II disorder: Secondary | ICD-10-CM | POA: Diagnosis not present

## 2024-05-08 DIAGNOSIS — Z5971 Insufficient health insurance coverage: Secondary | ICD-10-CM | POA: Insufficient documentation

## 2024-05-08 DIAGNOSIS — R002 Palpitations: Secondary | ICD-10-CM | POA: Diagnosis not present

## 2024-05-08 LAB — COMPREHENSIVE METABOLIC PANEL WITH GFR
ALT: 29 U/L (ref 0–44)
AST: 25 U/L (ref 15–41)
Albumin: 4.7 g/dL (ref 3.5–5.0)
Alkaline Phosphatase: 61 U/L (ref 38–126)
Anion gap: 10 (ref 5–15)
BUN: 22 mg/dL — ABNORMAL HIGH (ref 6–20)
CO2: 27 mmol/L (ref 22–32)
Calcium: 9.5 mg/dL (ref 8.9–10.3)
Chloride: 101 mmol/L (ref 98–111)
Creatinine, Ser: 1.36 mg/dL — ABNORMAL HIGH (ref 0.61–1.24)
GFR, Estimated: >60 mL/min
Glucose, Bld: 100 mg/dL — ABNORMAL HIGH (ref 70–99)
Potassium: 4 mmol/L (ref 3.5–5.1)
Sodium: 138 mmol/L (ref 135–145)
Total Bilirubin: 0.6 mg/dL (ref 0.0–1.2)
Total Protein: 7.5 g/dL (ref 6.5–8.1)

## 2024-05-08 LAB — CBC WITH DIFFERENTIAL/PLATELET
Abs Immature Granulocytes: 0.01 K/uL (ref 0.00–0.07)
Basophils Absolute: 0.1 K/uL (ref 0.0–0.1)
Basophils Relative: 1 %
Eosinophils Absolute: 0.2 K/uL (ref 0.0–0.5)
Eosinophils Relative: 3 %
HCT: 42.3 % (ref 39.0–52.0)
Hemoglobin: 14.3 g/dL (ref 13.0–17.0)
Immature Granulocytes: 0 %
Lymphocytes Relative: 47 %
Lymphs Abs: 3.4 K/uL (ref 0.7–4.0)
MCH: 30.7 pg (ref 26.0–34.0)
MCHC: 33.8 g/dL (ref 30.0–36.0)
MCV: 90.8 fL (ref 80.0–100.0)
Monocytes Absolute: 0.5 K/uL (ref 0.1–1.0)
Monocytes Relative: 7 %
Neutro Abs: 3 K/uL (ref 1.7–7.7)
Neutrophils Relative %: 42 %
Platelets: 272 K/uL (ref 150–400)
RBC: 4.66 MIL/uL (ref 4.22–5.81)
RDW: 11.4 % — ABNORMAL LOW (ref 11.5–15.5)
WBC: 7.2 K/uL (ref 4.0–10.5)
nRBC: 0 % (ref 0.0–0.2)

## 2024-05-08 LAB — CHLAMYDIA/NGC RT PCR (ARMC ONLY)
Chlamydia Tr: NOT DETECTED
Chlamydia Tr: NOT DETECTED
Chlamydia Tr: NOT DETECTED
N gonorrhoeae: NOT DETECTED
N gonorrhoeae: NOT DETECTED
N gonorrhoeae: NOT DETECTED

## 2024-05-08 NOTE — Progress Notes (Addendum)
 NAME: Darrell Kennedy  DOB: 03-17-1981  MRN: [id number]  Date/Time: 05/08/2024 11:51 AM  Subjective:  Darrell Kennedy is a 44 y.o. male with a history of HIV  Follow up HIV visit Last seen March 2025 Doing well No new complaints 100% adherent to Dovato  Last Vl < 20 and Cd4 873 In a steady relationship with 1 person ?  Followed by psychiatrist Dr.Eppen for Bipolar disorder which is well controlled on lamictal   05/23/21: Repair of Left knee anterior cruciate ligament tear Left lateral meniscus tear   02/12/19   with TIA like episode when he felt the left arm and leg tingling preceded by palpitation. Was transient. underwent work up with Neg MRI, ECHO, TEE which did not show any intracardiac shunting. He saw neurologist Dr.Shah at the [facility] clinic on 02/17/19  and he sent hypercoagulable panel ESR, CRP - Factor V Leiden mutation - Antithrombin III functional assay - MTHFR gene mutation - Protein C and S activity (functional assay) - Homocysteine - Lupus anticoagulant comprehensive - Antiphospholipid syndrome (includes aPTT, PT, INR, Thrombin time, Dilute Viper Venom Time, Hexagonal phase phospholipid, Anticoardiolipin antibody IgG and IgM, Beta - 2 Glycoprotein IgG and IgM,   Anticardiolipin IgM  ab was elevated at 13 ( 0-12 ) N and homocysteine was 16( 0-14.5) Started on methylfolate  and aspirin  . seen heme once Dr.Finnegan .    HIV history HIV diagnosed NOV 2015 in  [place] when he had flu like symptoms and knew he had acute HIV and went and got tested. Nadir Cd4 was > 700  He saw Dr.PAul Claudene and was started on isentress and truvada. He later changed to Genvoya. Since end of 2017 he was off treatment because of lack of insurance. He moved to [facility] in 2018 and did not engage in care because of lack on insurance until  Jan 2020  Nadir Cd4 484 VL >200,000 OI none HAARt history truvada isentress Genvoya Biktarvy  Acquired thru-sex with men Genotype done in [place] ? 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   Anxiety    Bipolar II disorder, moderate, depressed, with mixed features, in full remission (HCC)    followed by dr coby (psychologist)   Heart murmur    History of transient ischemic attack (TIA) 01/2019   possible TIA vs clash of medication;  work-up, normal echo & carotid doppler,  negative MRI, TEE w/ bubble study by cardiology , dr hester (in epic 03-05-2019) insig interatrial shunting otherwise normal;  positive anticardiolipin antibody & elevated homocysteine, started asa / folate   HIV (human immunodeficiency virus infection) (HCC) 02/2014   followed by dr alberta (ID)   HLD (hyperlipidemia)    Hyperlipidemia    Insomnia    Wears glasses     Surgical History -none  FH Father- diabetes, HTN Mother-adrenal tumor removed  ?SH Non smoker No illicit drug use Occasional alcohol Has traveled to [place] in 2013 Lives with his parents One steady partner- monogamous relationship Partner knows his status, he is on Prep and is negative ? Current Outpatient Medications  Medication Sig Dispense Refill   atorvastatin  (LIPITOR) 20 MG tablet TAKE ONE TABLET BY MOUTH ONE TIME DAILY 30 tablet 5   DOVATO  50-300 MG tablet TAKE ONE TABLET BY MOUTH ONE TIME DAILY 30 tablet 6   doxycycline  (ADOXA) 100 MG tablet TAKE TWO TABLETS BY MOUTH WITHIN 24 HOURS AFTER UNPROTECTED SEX AS NEEDED 30 tablet 1  lamoTRIgine  (LAMICTAL ) 150 MG tablet Take 1 tablet (150 mg total) by mouth 2 (two) times daily. 180 tablet 1   valACYclovir  (VALTREX ) 1000 MG tablet Take 1 tablet (1,000 mg total) by mouth 2 (two) times daily. 20 tablet 0   No current facility-administered medications for this visit.    REVIEW OF SYSTEMS:  Const: negative fever, negative chills, negative weight loss Eyes: negative diplopia or visual changes, negative eye pain ENT: negative coryza, negative sore throat Resp: negative  cough, hemoptysis, dyspnea Cards: negative for chest pain, palpitations, lower extremity edema GU: negative for frequency, dysuria and hematuria Skin: negative for rash and pruritus Heme: negative for easy bruising and gum/nose bleeding MS: negative for myalgias, arthralgias, back pain and muscle weakness Neurolo:negative for headaches, dizziness, vertigo, memory problems    Objective:  VITALS:  BP 131/77   Pulse (!) 58   Temp 98 F (36.7 C) (Temporal)   Ht 5' 7 (1.702 m)   Wt 181 lb (82.1 kg)   SpO2 97%   BMI 28.35 kg/m  PHYSICAL EXAM:  General: Looks well  head: Normocephalic, without obvious abnormality, atraumatic. Eyes: Conjunctivae clear, anicteric sclerae. Pupils are equal Nose: Nares normal. No drainage or sinus tenderness. Throat: Lips, mucosa, and tongue normal. No Thrush Lungs: Clear to auscultation bilaterally. No Wheezing or Rhonchi. No rales. Heart: Regular rate and rhythm, no murmur, rub or gallop. Abdomen: Soft, non-tender,not distended. Bowel sounds normal. No masses Extremities: Extremities normal, atraumatic, no cyanosis. No edema. No clubbing Skin: No rashes or lesions. Not Jaundiced Lymph: Cervical, supraclavicular normal. Neurologic: Grossly non-focal Pertinent Labs   Health maintenance Vaccination  Vaccine Date last given comment  Influenza Oct 2025   Hepatitis B 05/15/2014,2/29 &11/24/14 [place]  Hepatitis A 05/15/14 &06/15/14   Prevnar-PCV-13 04/02/2014   Pneumovac-PPSV-23 09/10/18   TdaP 05/15/14   HPV 09/23/19, 10/21/19 and 03/18/20   Shingrix ( zoster vaccine)    Corona virus vaccine X 3 08/05/19, 5/21, 02/06/20, 01/18/21   Monkey pox- aug.sept  ______________________  Labs Lab Result  Date comment  HIV VL <20 6/22   CD4 775 (28.7%) 11/03/21   Genotype Negative 04/09/2014 [facility]- [place]  [id number] NEG 04/02/2014   HIV antibody Reactive 05/07/18   RPR NR 03/23/22   Quantiferon Gold NR 03/24/21   Hep C ab NR 03/24/21   Hepatitis B-ab,ag,c  Ab-positive 05/07/18 vaccinated  Hepatitis A-IgM, IgG /T     Lipid 159/46/91 and TGL 122 09/22/21   GC/CHL NR 03/24/22   PAP Benign 10/07/21   HB,PLT,Cr, LFT 14/284/1.34/N      Preventive  Procedure Result  Date comment  colonoscopy     ANAL pap N 07/10/23   Dental exam     Opthal       Impression/Recommendation ? HIV- and not AIDS- on Dovato - last VL < 20 and Cd4 873- will do labs today Pt has Doxycycline  for PEP  In a stable relationship with a male partner who is Neg and knows his status   H/o TIA  like episode-once  investigated and found to have borderline high Anticardiolipin IgM and homocysteine- on aspirin  and methyl folate Followed by heme onc who does not think the labs are significant It was thought to be due to his medication ( antidepressant/  Bipolar  disorder- followed by Psychiatrist- on lamictal  250mg  QD,    Hyperlipidemia- on atorvastatin  Increase in cr- follow closely   Refer to GI for consideration for colon ca screening as both his uncles in their 25s  diagnosed with colon cancer   _Anal pap  done today  STD screen done today_   __TDAP next visit __________________________________________ Discussed with patient Follow up 9 months

## 2024-05-09 LAB — T-HELPER CELLS CD4/CD8 %
% CD 4 Pos. Lymph.: 32.4 % (ref 30.8–58.5)
Absolute CD 4 Helper: 1134 /uL (ref 359–1519)
Basophils Absolute: 0.1 x10E3/uL (ref 0.0–0.2)
Basos: 1 %
CD3+CD4+ Cells/CD3+CD8+ Cells Bld: 0.77 — ABNORMAL LOW (ref 0.92–3.72)
CD3+CD8+ Cells # Bld: 1470 /uL — ABNORMAL HIGH (ref 109–897)
CD3+CD8+ Cells NFr Bld: 42 % — ABNORMAL HIGH (ref 12.0–35.5)
EOS (ABSOLUTE): 0.2 x10E3/uL (ref 0.0–0.4)
Eos: 3 %
Hematocrit: 44 % (ref 37.5–51.0)
Hemoglobin: 14.7 g/dL (ref 13.0–17.7)
Immature Grans (Abs): 0 x10E3/uL (ref 0.0–0.1)
Immature Granulocytes: 0 %
Lymphocytes Absolute: 3.5 x10E3/uL — ABNORMAL HIGH (ref 0.7–3.1)
Lymphs: 47 %
MCH: 30.4 pg (ref 26.6–33.0)
MCHC: 33.4 g/dL (ref 31.5–35.7)
MCV: 91 fL (ref 79–97)
Monocytes Absolute: 0.5 x10E3/uL (ref 0.1–0.9)
Monocytes: 7 %
Neutrophils Absolute: 3.1 x10E3/uL (ref 1.4–7.0)
Neutrophils: 42 %
Platelets: 295 x10E3/uL (ref 150–450)
RBC: 4.84 x10E6/uL (ref 4.14–5.80)
RDW: 11.8 % (ref 11.6–15.4)
WBC: 7.5 x10E3/uL (ref 3.4–10.8)

## 2024-05-09 LAB — SYPHILIS: RPR W/REFLEX TO RPR TITER AND TREPONEMAL ANTIBODIES, TRADITIONAL SCREENING AND DIAGNOSIS ALGORITHM: RPR Ser Ql: NONREACTIVE

## 2024-05-10 LAB — HIV-1 RNA QUANT-NO REFLEX-BLD
HIV 1 RNA Quant: 400 {copies}/mL
LOG10 HIV-1 RNA: 2.602 {Log_copies}/mL

## 2024-05-13 ENCOUNTER — Encounter: Payer: Self-pay | Admitting: Infectious Diseases

## 2024-05-13 LAB — CYTOLOGY - PAP
Adequacy: ABSENT
Diagnosis: NEGATIVE

## 2024-05-14 ENCOUNTER — Ambulatory Visit: Payer: Self-pay | Admitting: Infectious Diseases

## 2024-06-02 ENCOUNTER — Telehealth: Admitting: Psychiatry

## 2025-02-03 ENCOUNTER — Ambulatory Visit: Admitting: Infectious Diseases

## 2025-05-07 ENCOUNTER — Ambulatory Visit: Admitting: Infectious Diseases
